# Patient Record
Sex: Female | Born: 1965
Health system: Southern US, Community
[De-identification: ages and names within clinical notes are randomized; demographics above are authoritative.]

## PROBLEM LIST (undated history)

## (undated) ENCOUNTER — Emergency Department: Discharge status: Absent without leave | Payer: Self-pay

## (undated) DIAGNOSIS — M329 Systemic lupus erythematosus, unspecified: Secondary | ICD-10-CM

## (undated) DIAGNOSIS — D573 Sickle-cell trait: Secondary | ICD-10-CM

## (undated) HISTORY — DX: Systemic lupus erythematosus, unspecified: M32.9

## (undated) HISTORY — DX: Sickle-cell trait: D57.3

---

## 2000-07-06 HISTORY — PX: QUADRICEPS TENDON REPAIR: SHX756

## 2008-05-06 ENCOUNTER — Encounter: Admission: RE | Admit: 2008-05-06 | Discharge: 2008-07-26 | Payer: Self-pay | Admitting: Family Medicine

## 2008-06-21 ENCOUNTER — Encounter: Admission: RE | Admit: 2008-06-21 | Discharge: 2008-06-21 | Payer: Self-pay | Admitting: Family Medicine

## 2008-08-09 ENCOUNTER — Encounter: Admission: RE | Admit: 2008-08-09 | Discharge: 2008-08-09 | Payer: Self-pay | Admitting: Family Medicine

## 2008-08-26 DIAGNOSIS — J45909 Unspecified asthma, uncomplicated: Secondary | ICD-10-CM

## 2008-08-26 HISTORY — DX: Unspecified asthma, uncomplicated: J45.909

## 2009-09-07 ENCOUNTER — Encounter: Admission: RE | Admit: 2009-09-07 | Discharge: 2009-09-07 | Payer: Self-pay | Admitting: Family Medicine

## 2010-02-20 ENCOUNTER — Encounter: Payer: Self-pay | Admitting: Family Medicine

## 2011-11-12 ENCOUNTER — Other Ambulatory Visit: Payer: Self-pay | Admitting: Family Medicine

## 2011-11-12 DIAGNOSIS — Z1231 Encounter for screening mammogram for malignant neoplasm of breast: Secondary | ICD-10-CM

## 2011-11-13 ENCOUNTER — Ambulatory Visit (INDEPENDENT_AMBULATORY_CARE_PROVIDER_SITE_OTHER): Payer: BC Managed Care – PPO

## 2011-11-13 DIAGNOSIS — Z1231 Encounter for screening mammogram for malignant neoplasm of breast: Secondary | ICD-10-CM

## 2011-11-13 DIAGNOSIS — R928 Other abnormal and inconclusive findings on diagnostic imaging of breast: Secondary | ICD-10-CM

## 2011-11-16 ENCOUNTER — Other Ambulatory Visit: Payer: Self-pay | Admitting: Family Medicine

## 2011-11-16 DIAGNOSIS — R928 Other abnormal and inconclusive findings on diagnostic imaging of breast: Secondary | ICD-10-CM

## 2011-11-20 ENCOUNTER — Ambulatory Visit
Admission: RE | Admit: 2011-11-20 | Discharge: 2011-11-20 | Disposition: A | Discharge status: Home / Self Care | Payer: BC Managed Care – PPO | Source: Ambulatory Visit | Attending: Family Medicine | Admitting: Family Medicine

## 2011-11-20 DIAGNOSIS — R928 Other abnormal and inconclusive findings on diagnostic imaging of breast: Secondary | ICD-10-CM

## 2011-12-16 ENCOUNTER — Emergency Department (INDEPENDENT_AMBULATORY_CARE_PROVIDER_SITE_OTHER)
Admission: EM | Admit: 2011-12-16 | Discharge: 2011-12-16 | Disposition: A | Discharge status: Home / Self Care | Payer: BC Managed Care – PPO | Source: Home / Self Care | Attending: Emergency Medicine | Admitting: Emergency Medicine

## 2011-12-16 DIAGNOSIS — N921 Excessive and frequent menstruation with irregular cycle: Secondary | ICD-10-CM

## 2011-12-16 DIAGNOSIS — R110 Nausea: Secondary | ICD-10-CM

## 2011-12-16 LAB — POCT CBC W AUTO DIFF (K'VILLE URGENT CARE)

## 2011-12-16 MED ORDER — ONDANSETRON 4 MG PO TBDP: 4.0000 mg | ORAL_TABLET | Freq: Three times a day (TID) | ORAL | Status: DC | PRN

## 2011-12-16 NOTE — ED Notes (Signed)
States she is premenopausal and is having dizziness, vomiting and heavy menstrual flow.

## 2011-12-16 NOTE — ED Provider Notes (Signed)
History     CSN: [id number]  Arrival date & time 12/16/11  1306   First MD Initiated Contact with Patient 12/16/11 1317      Chief Complaint  Patient presents with  . Dizziness  . Nausea    (Consider location/radiation/quality/duration/timing/severity/associated sxs/prior treatment) HPI 46 year old female here with husband. 2 days ago, they were driving towards [place], but on the way, she developed lightheadedness, nausea vomited 2 or 3 times without any blood or black-colored vomitus. Had some mild diffuse abdominal pain and lower abdominal cramps. She felt this was all from the onset of a very heavy menstrual period, which persisted with cramping for a day. She feels the cramps and the menstrual flow has significantly decreased today. No diarrhea. Today, she's been able to tolerate by mouth liquids and small amount of bland solid food today without any vomiting. Still has some mild nausea but that's improving in its mild. Currently, she admits to very minimal lower abdominal cramping but that's minimal and improving. Denies any other abdominal symptoms. She states she is premenopausal and being followed by her PCP. Denies history of anemia and she states past medical history is negative for chronic disease.  She denies any history of tick bite or rash. History reviewed. No pertinent past medical history.  History reviewed. No pertinent past surgical history.  Family History  Problem Relation Age of Onset  . Hypertension Father   . Diabetes Other     History  Substance Use Topics  . Smoking status: Not on file  . Smokeless tobacco: Never Used  . Alcohol Use: No    OB History    Grav Para Term Preterm Abortions TAB SAB Ect Mult Living                  Review of Systems  Constitutional: Positive for fatigue (mild). Negative for fever, chills and diaphoresis.  HENT: Negative.   Eyes: Negative.   Respiratory: Negative.   Cardiovascular: Negative.  Negative for chest  pain and palpitations.  Gastrointestinal: Negative for blood in stool, abdominal distention and anal bleeding.  Genitourinary: Negative.  Negative for dysuria and hematuria.  Musculoskeletal: Negative for back pain and arthralgias.  Neurological: Positive for light-headedness (Was severe, but now is mild.). Negative for tremors, seizures, syncope, speech difficulty, numbness and headaches.  Hematological: Negative.   Psychiatric/Behavioral: Negative for hallucinations and confusion.    Allergies  Penicillins  Home Medications   Current Outpatient Rx  Name  Route  Sig  Dispense  Refill  . VITAMIN D (ERGOCALCIFEROL) 50000 UNITS PO CAPS   Oral   Take 50,000 Units by mouth.         . ONDANSETRON 4 MG PO TBDP   Oral   Take 1 tablet (4 mg total) by mouth every 8 (eight) hours as needed for nausea.   20 tablet   0     BP 91/66  Pulse 81  Temp 97.9 F (36.6 C) (Oral)  Resp 18  Ht 5\' 3"  (1.6 m)  Wt 178 lb (80.74 kg)  BMI 31.53 kg/m2  SpO2 96%  LMP 11/08/2011  Physical Exam  Nursing note and vitals reviewed. Constitutional: She is oriented to person, place, and time. She appears well-developed and well-nourished. No distress.       She appears to be mildly fatigued, but she is alert, cooperative, and is able to walk on her own. Gait normal.  HENT:  Head: Normocephalic and atraumatic.  Mouth/Throat: Oropharynx is clear and moist.  Eyes: Conjunctivae normal and EOM are normal. Pupils are equal, round, and reactive to light. No scleral icterus.  Neck: Normal range of motion. Neck supple. No JVD present.  Cardiovascular: Normal rate and normal heart sounds.  Exam reveals no gallop and no friction rub.   No murmur heard. Pulmonary/Chest: Effort normal and breath sounds normal.  Abdominal: Soft. Bowel sounds are normal. She exhibits no distension and no mass. There is tenderness (Minimal, nonreproducible lower abdominal tenderness). There is no rebound and no guarding.    Musculoskeletal: Normal range of motion. She exhibits no edema and no tenderness.  Lymphadenopathy:    She has no cervical adenopathy.  Neurological: She is alert and oriented to person, place, and time. She displays normal reflexes. No cranial nerve deficit. She exhibits normal muscle tone. Coordination normal.  Skin: Skin is warm and dry. No rash noted. No erythema.  Psychiatric: She has a normal mood and affect.   She declined pelvic exam, and that's deferred to her PCP or her GYN.   ED Course  Procedures (including critical care time)   Labs Reviewed  POCT CBC W AUTO DIFF ([facility])   No results found.   1. Nausea   2. Menometrorrhagia       MDM  Likely has a viral syndrome that is resolving. The menometrorrhagia could be a contributor to her symptoms, but her physical exam is normal and her symptoms are resolving on their own. CBC today within normal limits. WBC 5.5. Hemoglobin 11.9. As she is improving on her own, no other particular treatment is needed other than pushing clear liquids and advancing bland diet as tolerated. I prescribed Zofran ODT when necessary nausea. Note written for her employer. Followup with PCP and/or GYN if any symptoms persist or recur. She and husband voiced understanding and agreement with above.        Lajean Manes, MD 12/16/11 1929

## 2012-04-05 ENCOUNTER — Encounter: Payer: Self-pay | Admitting: *Deleted

## 2012-04-05 ENCOUNTER — Emergency Department (INDEPENDENT_AMBULATORY_CARE_PROVIDER_SITE_OTHER)
Admission: EM | Admit: 2012-04-05 | Discharge: 2012-04-05 | Disposition: A | Discharge status: Home / Self Care | Payer: BC Managed Care – PPO | Source: Home / Self Care | Attending: Family Medicine | Admitting: Family Medicine

## 2012-04-05 DIAGNOSIS — J069 Acute upper respiratory infection, unspecified: Secondary | ICD-10-CM

## 2012-04-05 MED ORDER — AZITHROMYCIN 250 MG PO TABS: ORAL_TABLET | ORAL | Status: DC

## 2012-04-05 NOTE — ED Provider Notes (Signed)
History     CSN: [id number]  Arrival date & time 04/05/12  1627   First MD Initiated Contact with Patient 04/05/12 1631      Chief Complaint  Patient presents with  . Headache  . Hoarse  . Cough   HPI  URI Symptoms Onset: 3 days Description: rhinorrhea, nasal congestion, cough, sinus pressure  Modifying factors:  On plaquenil for lupus   Symptoms Nasal discharge: yes Fever: yes Sore throat: mild Cough: yes Wheezing: no Ear pain: no GI symptoms: no Sick contacts: yes  Red Flags  Stiff neck: no Dyspnea: minimal-mild Rash: no Swallowing difficulty: no  Sinusitis Risk Factors Headache/face pain: mild Double sickening: no tooth pain: no  Allergy Risk Factors Sneezing: no Itchy scratchy throat: no Seasonal symptoms: no  Flu Risk Factors Headache: no muscle aches: no severe fatigue: no   History reviewed. No pertinent past medical history.  History reviewed. No pertinent past surgical history.  Family History  Problem Relation Age of Onset  . Hypertension Father   . Diabetes Other     History  Substance Use Topics  . Smoking status: Not on file  . Smokeless tobacco: Never Used  . Alcohol Use: No    OB History   Grav Para Term Preterm Abortions TAB SAB Ect Mult Living                  Review of Systems  All other systems reviewed and are negative.    Allergies  Penicillins  Home Medications   Current Outpatient Rx  Name  Route  Sig  Dispense  Refill  . ondansetron (ZOFRAN-ODT) 4 MG disintegrating tablet   Oral   Take 1 tablet (4 mg total) by mouth every 8 (eight) hours as needed for nausea.   20 tablet   0   . Vitamin D, Ergocalciferol, (DRISDOL) 50000 UNITS CAPS   Oral   Take 50,000 Units by mouth.           BP 121/73  Pulse 100  Temp(Src) 98.2 F (36.8 C) (Oral)  Resp 16  Ht 5' 3.5" (1.613 m)  Wt 181 lb 8 oz (82.328 kg)  BMI 31.64 kg/m2  SpO2 98%  Physical Exam  Constitutional: She appears well-developed and  well-nourished.  HENT:  Head: Normocephalic and atraumatic.  Right Ear: External ear normal.  Left Ear: External ear normal.  +nasal erythema, rhinorrhea bilaterally, + post oropharyngeal erythema    Eyes: Conjunctivae are normal. Pupils are equal, round, and reactive to light.  Neck: Normal range of motion.  Cardiovascular: Normal rate, regular rhythm and normal heart sounds.   Pulmonary/Chest: Effort normal and breath sounds normal.  Abdominal: Soft.  Musculoskeletal: Normal range of motion.  Lymphadenopathy:    She has no cervical adenopathy.  Neurological: She is alert.  Skin: Skin is warm.    ED Course  Procedures (including critical care time)  Labs Reviewed - No data to display No results found.   1. URI (upper respiratory infection)       MDM  Likely viral process  Will place on zpak for infectious coverage given baseline immunocompromised status.  Discussed infectious and ENT/resp red flags.  Follow up as needed.      The patient and/or caregiver has been counseled thoroughly with regard to treatment plan and/or medications prescribed including dosage, schedule, interactions, rationale for use, and possible side effects and they verbalize understanding. Diagnoses and expected course of recovery discussed and will return if not improved as  expected or if the condition worsens. Patient and/or caregiver verbalized understanding.             Doree Albee, MD 04/05/12 [id number]

## 2012-04-05 NOTE — ED Notes (Signed)
Pt c/o sinus problems, hoarseness, and cough x 3 days. Has tried OTC Airborne, Tylenol Cold and Sinus with little help.

## 2014-04-05 ENCOUNTER — Other Ambulatory Visit: Payer: Self-pay | Admitting: Family Medicine

## 2014-04-05 DIAGNOSIS — Z1231 Encounter for screening mammogram for malignant neoplasm of breast: Secondary | ICD-10-CM

## 2014-04-08 ENCOUNTER — Ambulatory Visit: Payer: BC Managed Care – PPO

## 2014-04-22 ENCOUNTER — Ambulatory Visit (INDEPENDENT_AMBULATORY_CARE_PROVIDER_SITE_OTHER)

## 2014-04-22 DIAGNOSIS — Z1231 Encounter for screening mammogram for malignant neoplasm of breast: Secondary | ICD-10-CM | POA: Diagnosis not present

## 2014-07-05 DIAGNOSIS — S76119S Strain of unspecified quadriceps muscle, fascia and tendon, sequela: Secondary | ICD-10-CM | POA: Insufficient documentation

## 2014-07-05 HISTORY — DX: Strain of unspecified quadriceps muscle, fascia and tendon, sequela: S76.119S

## 2014-07-26 ENCOUNTER — Emergency Department
Admission: EM | Admit: 2014-07-26 | Discharge: 2014-07-26 | Disposition: A | Discharge status: Home / Self Care | Source: Home / Self Care | Attending: Emergency Medicine | Admitting: Emergency Medicine

## 2014-07-26 ENCOUNTER — Encounter: Payer: Self-pay | Admitting: Emergency Medicine

## 2014-07-26 ENCOUNTER — Emergency Department (INDEPENDENT_AMBULATORY_CARE_PROVIDER_SITE_OTHER)

## 2014-07-26 DIAGNOSIS — J9811 Atelectasis: Secondary | ICD-10-CM | POA: Diagnosis not present

## 2014-07-26 DIAGNOSIS — R05 Cough: Secondary | ICD-10-CM | POA: Diagnosis not present

## 2014-07-26 DIAGNOSIS — R059 Cough, unspecified: Secondary | ICD-10-CM

## 2014-07-26 DIAGNOSIS — J209 Acute bronchitis, unspecified: Secondary | ICD-10-CM

## 2014-07-26 MED ORDER — HYDROCODONE-HOMATROPINE 5-1.5 MG/5ML PO SYRP: 5.0000 mL | ORAL_SOLUTION | Freq: Four times a day (QID) | ORAL | Status: DC | PRN

## 2014-07-26 MED ORDER — AZITHROMYCIN 250 MG PO TABS: ORAL_TABLET | ORAL | Status: DC

## 2014-07-26 NOTE — ED Notes (Signed)
Pt c/o productive cough and congestion x1 week. States her lungs feel sore and it hurts to cough. She has also been running low grade fever.

## 2014-07-26 NOTE — ED Provider Notes (Signed)
CSN: [id number]     Arrival date & time 07/26/14  1727 History   First MD Initiated Contact with Patient 07/26/14 1748     Chief Complaint  Patient presents with  . Cough   (Consider location/radiation/quality/duration/timing/severity/associated sxs/prior Treatment) Patient is a 49 y.o. female presenting with cough. The history is provided by the patient. No language interpreter was used.  Cough Cough characteristics:  Productive Sputum characteristics:  Nondescript Severity:  Moderate Onset quality:  Gradual Timing:  Constant Progression:  Worsening Chronicity:  New Smoker: no   Context: upper respiratory infection   Relieved by:  Nothing Worsened by:  Nothing tried Ineffective treatments:  None tried Associated symptoms: sinus congestion   Associated symptoms: no shortness of breath   Risk factors: no recent infection   Pt reports she has had a cough and congestion for the past week.  Pt reports fever on and off.  Pt had knee surgery 3 weeks ago.  Pt reports no new swelling to leg.  Pt reports she feels like she has infection.  History reviewed. No pertinent past medical history. History reviewed. No pertinent past surgical history. Family History  Problem Relation Age of Onset  . Hypertension Father   . Diabetes Other    History  Substance Use Topics  . Smoking status: Not on file  . Smokeless tobacco: Never Used  . Alcohol Use: No   OB History    No data available     Review of Systems  Respiratory: Positive for cough. Negative for shortness of breath.   Cardiovascular: Negative for leg swelling.  All other systems reviewed and are negative.   Allergies  Penicillins  Home Medications   Prior to Admission medications   Medication Sig Start Date End Date Taking? Authorizing Provider  azithromycin (ZITHROMAX) 250 MG tablet Take 2 tabs PO x 1 dose, then 1 tab PO QD x 4 days 04/05/12   Deneise Lever, MD  ondansetron (ZOFRAN-ODT) 4 MG disintegrating tablet Take  1 tablet (4 mg total) by mouth every 8 (eight) hours as needed for nausea. 12/16/11   Jacqulyn Cane, MD  Vitamin D, Ergocalciferol, (DRISDOL) 50000 UNITS CAPS Take 50,000 Units by mouth.    Historical Provider, MD   BP 105/71 mmHg  Pulse 94  Temp(Src) 100 F (37.8 C) (Oral)  SpO2 96% Physical Exam  Constitutional: She is oriented to person, place, and time. She appears well-developed and well-nourished.  HENT:  Head: Normocephalic.  Eyes: Conjunctivae and EOM are normal. Pupils are equal, round, and reactive to light.  Neck: Normal range of motion.  Cardiovascular: Normal rate and normal heart sounds.   Pulmonary/Chest: Effort normal and breath sounds normal.  Abdominal: Soft. She exhibits no distension.  Musculoskeletal: Normal range of motion.  Neurological: She is alert and oriented to person, place, and time.  Psychiatric: She has a normal mood and affect.  Nursing note and vitals reviewed.   ED Course  Procedures (including critical care time) Labs Review Labs Reviewed - No data to display  Imaging Review Dg Chest 2 View  07/26/2014   CLINICAL DATA:  Cough and wheezing. Right side chest pain. Symptoms for 3 days.  EXAM: CHEST  2 VIEW  COMPARISON:  None.  FINDINGS: Lung volumes are low with subsegmental atelectasis in the bases. No consolidative process, pneumothorax or effusion is identified. Heart size is normal.  IMPRESSION: Bibasilar atelectasis in a low volume chest.  No acute abnormality.   Electronically Signed   By: Marcello Moores  Dalessio M.D.   On: 07/26/2014 18:28     MDM pulse ox normal, no tachycardia, chest xray shows atelectasis.  I wil treat pt with zithromax and hydromet for cough.   Pt is given incentive spirometer and instructed in use.  I discussed PE risk post surgery.  I  Explained to pt she would need a Ct angio to rule out PE.  She feels like her symptoms are illness and she is not interested in going to ED and having Ct.  Pt is advised if shortness of breath or  worsening symptoms she will need to go to ED.    1. Acute bronchitis, unspecified organism   2. Cough    zithromax hydromet  avs  [name], [place] 07/26/14 1919

## 2014-07-26 NOTE — Discharge Instructions (Signed)

## 2015-01-05 ENCOUNTER — Ambulatory Visit: Admitting: Internal Medicine

## 2015-03-08 ENCOUNTER — Ambulatory Visit: Admitting: Osteopathic Medicine

## 2015-03-21 ENCOUNTER — Encounter: Payer: Self-pay | Admitting: Osteopathic Medicine

## 2015-03-21 ENCOUNTER — Ambulatory Visit (INDEPENDENT_AMBULATORY_CARE_PROVIDER_SITE_OTHER): Admitting: Osteopathic Medicine

## 2015-03-21 VITALS — BP 119/68 | HR 72 | Wt 194.0 lb

## 2015-03-21 DIAGNOSIS — Z8639 Personal history of other endocrine, nutritional and metabolic disease: Secondary | ICD-10-CM

## 2015-03-21 DIAGNOSIS — R0789 Other chest pain: Secondary | ICD-10-CM

## 2015-03-21 DIAGNOSIS — E538 Deficiency of other specified B group vitamins: Secondary | ICD-10-CM | POA: Diagnosis not present

## 2015-03-21 DIAGNOSIS — D573 Sickle-cell trait: Secondary | ICD-10-CM

## 2015-03-21 DIAGNOSIS — M329 Systemic lupus erythematosus, unspecified: Secondary | ICD-10-CM

## 2015-03-21 DIAGNOSIS — Z13220 Encounter for screening for lipoid disorders: Secondary | ICD-10-CM

## 2015-03-21 DIAGNOSIS — R5382 Chronic fatigue, unspecified: Secondary | ICD-10-CM

## 2015-03-21 DIAGNOSIS — Z79899 Other long term (current) drug therapy: Secondary | ICD-10-CM

## 2015-03-21 HISTORY — DX: Systemic lupus erythematosus, unspecified: M32.9

## 2015-03-21 HISTORY — DX: Sickle-cell trait: D57.3

## 2015-03-21 HISTORY — DX: Personal history of other endocrine, nutritional and metabolic disease: Z86.39

## 2015-03-21 HISTORY — DX: Chronic fatigue, unspecified: R53.82

## 2015-03-21 HISTORY — DX: Other chest pain: R07.89

## 2015-03-21 LAB — COMPLETE METABOLIC PANEL WITH GFR
ALBUMIN: 4 g/dL (ref 3.6–5.1)
ALT: 15 U/L (ref 6–29)
AST: 17 U/L (ref 10–35)
Alkaline Phosphatase: 55 U/L (ref 33–130)
BUN: 11 mg/dL (ref 7–25)
CALCIUM: 9.5 mg/dL (ref 8.6–10.4)
CHLORIDE: 106 mmol/L (ref 98–110)
CO2: 26 mmol/L (ref 20–31)
CREATININE: 0.91 mg/dL (ref 0.50–1.05)
GFR, Est African American: 85 mL/min (ref >=60)
GFR, Est Non African American: 74 mL/min (ref >=60)
GLUCOSE: 93 mg/dL (ref 65–99)
Potassium: 4.3 mmol/L (ref 3.5–5.3)
SODIUM: 141 mmol/L (ref 135–146)
Total Bilirubin: 0.3 mg/dL (ref 0.2–1.2)
Total Protein: 7.7 g/dL (ref 6.1–8.1)

## 2015-03-21 LAB — URINALYSIS
Bilirubin Urine: NEGATIVE
Glucose, UA: NEGATIVE
HGB URINE DIPSTICK: NEGATIVE
Ketones, ur: NEGATIVE
LEUKOCYTES UA: NEGATIVE
NITRITE: NEGATIVE
PH: 6 (ref 5.0–8.0)
PROTEIN: NEGATIVE
Specific Gravity, Urine: 1.013 (ref 1.001–1.035)

## 2015-03-21 LAB — CBC WITH DIFFERENTIAL/PLATELET
BASOS ABS: 0.1 10*3/uL (ref 0.0–0.1)
BASOS PCT: 1 % (ref 0–1)
Eosinophils Absolute: 0.4 10*3/uL (ref 0.0–0.7)
Eosinophils Relative: 7 % — ABNORMAL HIGH (ref 0–5)
HEMATOCRIT: 36.6 % (ref 36.0–46.0)
HEMOGLOBIN: 12.1 g/dL (ref 12.0–15.0)
LYMPHS PCT: 36 % (ref 12–46)
Lymphs Abs: 1.8 10*3/uL (ref 0.7–4.0)
MCH: 27.9 pg (ref 26.0–34.0)
MCHC: 33.1 g/dL (ref 30.0–36.0)
MCV: 84.3 fL (ref 78.0–100.0)
MPV: 9 fL (ref 8.6–12.4)
Monocytes Absolute: 0.5 10*3/uL (ref 0.1–1.0)
Monocytes Relative: 10 % (ref 3–12)
NEUTROS ABS: 2.3 10*3/uL (ref 1.7–7.7)
Neutrophils Relative %: 46 % (ref 43–77)
Platelets: 412 10*3/uL — ABNORMAL HIGH (ref 150–400)
RBC: 4.34 MIL/uL (ref 3.87–5.11)
RDW: 14 % (ref 11.5–15.5)
WBC: 5.1 10*3/uL (ref 4.0–10.5)

## 2015-03-21 LAB — LIPID PANEL
CHOLESTEROL: 197 mg/dL (ref 125–200)
HDL: 42 mg/dL — AB (ref >=46)
LDL Cholesterol: 130 mg/dL — ABNORMAL HIGH (ref <130)
Total CHOL/HDL Ratio: 4.7 Ratio (ref <=5.0)
Triglycerides: 127 mg/dL (ref <150)
VLDL: 25 mg/dL (ref <30)

## 2015-03-21 LAB — VITAMIN B12: Vitamin B-12: >2000 pg/mL — ABNORMAL HIGH (ref 200–1100)

## 2015-03-21 LAB — TSH: TSH: 1.51 m[IU]/L

## 2015-03-21 MED ORDER — CYANOCOBALAMIN 1000 MCG/ML IJ SOLN
1000.0000 ug | Freq: Once | INTRAMUSCULAR | Status: AC
  Administered 2015-03-21: 1000 ug via INTRAMUSCULAR

## 2015-03-21 NOTE — Patient Instructions (Addendum)
Will get routine labs today for basic medical screening and to further evaluate possible issues with fatigue. Once I get the results from [place] and Dr. Dossie Der I reviewed her previous labs as well and at that point we'll determine how often you need to see me for regular follow-up. You're encouraged to schedule an annual wellness visit at your convenience to make sure that you're caught up with other screening labs and procedures for health maintenance and preventive medicine. If you don't hear back from me regarding these records in the next 1-2 weeks, please call the office and make sure that we have received them.  If chest discomfort worsens or is not helped by Tylenol or ibuprofen over-the-counter, come back and we can schedule a visit for possible osteopathic manipulation treatment to address ribs and spine which may be contributing to your pain. If chest pain becomes worse/severe or if you're having chest pain with activity, need to go to the emergency room right away.   Thanks for coming in today! If there is ever anything I can do for you, please don't hesitate to call the office to either leave me a message or schedule a visit to discuss face-to-face in detail. Take care! -Dr. Loni Muse.

## 2015-03-21 NOTE — Progress Notes (Signed)
HPI: Pam Gomez is a 50 y.o. female who presents to [facility] today for chief complaint of:  Chief Complaint  Patient presents with  . Establish Care    "COLLABORATIVE CARE FOR CURRENT MEDICAL CONDITION"     Medical history reviewed: SICKLE CELL TRAIT: No serious issues.  LUPUS: Follows with Dr. Dossie Der with [facility]. Sees her about every 6 months, last appt was about a month ago. Fatigue is bothering her. Was previously on Plaquenil but doesn't like to be on medications so stopped this - occasional rash and joint pain but no serious cardiac/neuro/renal problems  ORTHO: 06/2014 injured Quads - ruptured tendon IBS: no problems ASTHMA: no problems B12: 10/02/12 442 previously getting B12 shots  CHEST PAIN . Location: L chest/ribs . Quality: "pulling feeling" on the side  . Severity: mild/moderate . Duration: was going on before bronchitis  . Timing: interminttent . Assoc signs/symptoms: no SOB or dizziness, no CP on exertion  Previously at [facility] - bloodwork done about 11/2014   Past medical, social and family history reviewed: No past medical history on file. No past surgical history on file. Social History  Substance Use Topics  . Smoking status: Not on file  . Smokeless tobacco: Never Used  . Alcohol Use: No   Family History  Problem Relation Age of Onset  . Hypertension Father   . Diabetes Other     No current outpatient prescriptions on file.   No current facility-administered medications for this visit.   Allergies  Allergen Reactions  . Iodinated Diagnostic Agents Nausea And Vomiting  . Penicillins Hives      Review of Systems: CONSTITUTIONAL:  No  fever, no chills, (+) unintentional weight changes = gain HEAD/EYES/EARS/NOSE/THROAT: No  headache, no vision change, no hearing change, No  sore throat, No  sinus pressure, (+) tinnitus CARDIAC: (+) chest discomfort, No  pressure, No palpitations, No   orthopnea RESPIRATORY: No  cough, No  shortness of breath/wheeze GASTROINTESTINAL: No  nausea, No  vomiting, No  abdominal pain, No  blood in stool, No  diarrhea, No  constipation  MUSCULOSKELETAL: (+) myalgia/arthralgia GENITOURINARY: (+) incontinence, No  abnormal genital bleeding/discharge SKIN: No  rash/wounds/concerning lesions HEM/ONC: No  easy bruising/bleeding, No  abnormal lymph node ENDOCRINE: No polyuria/polydipsia/polyphagia, No  heat/cold intolerance  NEUROLOGIC: No  weakness, No  dizziness, No  slurred speech PSYCHIATRIC: No  concerns with depression, No  concerns with anxiety, No sleep problems, PHQ2 (-)  Exam:  BP 119/68 mmHg  Pulse 72  Wt 194 lb (87.998 kg)  LMP 11/08/2011 Constitutional: VS see above. General Appearance: alert, well-developed, well-nourished, NAD Eyes: Normal lids and conjunctive, non-icteric sclera, PERRLA Ears, Nose, Mouth, Throat: MMM, Normal external inspection ears/nares/mouth/lips/gums, TM normal bilaterally. Pharynx no erythema, no exudate.  Neck: No masses, trachea midline. No thyroid enlargement/tenderness/mass appreciated. No lymphadenopathy Respiratory: Normal respiratory effort. no wheeze, no rhonchi, no rales Cardiovascular: S1/S2 normal, no murmur, no rub/gallop auscultated. RRR. No lower extremity edema. Gastrointestinal: Nontender, no masses. No hepatomegaly, no splenomegaly. No hernia appreciated. Bowel sounds normal. Rectal exam deferred.  Musculoskeletal: Gait normal. No clubbing/cyanosis of digits. Rib motion fairly symmetric bilaterally, ?restriction to inhalation on L side, (+)costochondral tenderness on L Neurological: No cranial nerve deficit on limited exam. Motor and sensation intact and symmetric Skin: warm, dry, intact. No rash/ulcer. No concerning nevi or subq nodules on limited exam.   Psychiatric: Normal judgment/insight. Normal mood and affect. Oriented x3.    No results found for  this or any previous visit (from the past  72 hour(s)).  EKG interpretation: Rate: 65 Rhythm: Sinus No ST/T changes concerning for acute ischemia/infarct    ASSESSMENT/PLAN: Basic labs as below, request records from previous PCP and from her rheumatologist.  Systemic lupus ([place]) - Plan: CBC with Differential/Platelet, Urinalysis  Sickle cell trait ([place]) - Plan: CBC with Differential/Platelet  History of non anemic vitamin B12 deficiency - Plan: B12  Chest discomfort  Chronic fatigue - Plan: CBC with Differential/Platelet, TSH, VITAMIN D 25 Hydroxy (Vit-D Deficiency, Fractures)  Lipid screening - Plan: Lipid panel  Medication management - Plan: CBC with Differential/Platelet, COMPLETE METABOLIC PANEL WITH GFR, [id number]  B12 deficiency - Plan: cyanocobalamin ((VITAMIN B-12)) injection 1,000 mcg   Return At your convenience for annual wellness visit, as needed for other concerns, pending record review.

## 2015-03-22 LAB — VITAMIN D 25 HYDROXY (VIT D DEFICIENCY, FRACTURES): VIT D 25 HYDROXY: 30 ng/mL (ref 30–100)

## 2015-03-23 NOTE — Addendum Note (Signed)
Addended by: Huel Cote on: 03/23/2015 10:09 AM   Modules accepted: Orders

## 2015-03-25 LAB — HM COLONOSCOPY

## 2015-03-29 ENCOUNTER — Encounter: Payer: Self-pay | Admitting: Osteopathic Medicine

## 2015-03-29 DIAGNOSIS — Z9889 Other specified postprocedural states: Secondary | ICD-10-CM

## 2015-03-29 HISTORY — DX: Other specified postprocedural states: Z98.890

## 2015-07-12 ENCOUNTER — Ambulatory Visit (INDEPENDENT_AMBULATORY_CARE_PROVIDER_SITE_OTHER): Admitting: Osteopathic Medicine

## 2015-07-12 ENCOUNTER — Encounter: Payer: Self-pay | Admitting: Osteopathic Medicine

## 2015-07-12 ENCOUNTER — Other Ambulatory Visit (HOSPITAL_COMMUNITY)
Admission: RE | Admit: 2015-07-12 | Discharge: 2015-07-12 | Disposition: A | Discharge status: Home / Self Care | Source: Ambulatory Visit | Attending: Osteopathic Medicine | Admitting: Osteopathic Medicine

## 2015-07-12 ENCOUNTER — Telehealth: Payer: Self-pay | Admitting: Osteopathic Medicine

## 2015-07-12 VITALS — BP 115/79 | HR 78 | Wt 189.0 lb

## 2015-07-12 DIAGNOSIS — R7989 Other specified abnormal findings of blood chemistry: Secondary | ICD-10-CM

## 2015-07-12 DIAGNOSIS — Z01419 Encounter for gynecological examination (general) (routine) without abnormal findings: Secondary | ICD-10-CM | POA: Diagnosis present

## 2015-07-12 DIAGNOSIS — D473 Essential (hemorrhagic) thrombocythemia: Secondary | ICD-10-CM

## 2015-07-12 DIAGNOSIS — M25561 Pain in right knee: Secondary | ICD-10-CM | POA: Diagnosis not present

## 2015-07-12 DIAGNOSIS — Z1239 Encounter for other screening for malignant neoplasm of breast: Secondary | ICD-10-CM

## 2015-07-12 DIAGNOSIS — M25562 Pain in left knee: Secondary | ICD-10-CM

## 2015-07-12 DIAGNOSIS — G8929 Other chronic pain: Secondary | ICD-10-CM | POA: Insufficient documentation

## 2015-07-12 DIAGNOSIS — Z0000 Encounter for general adult medical examination without abnormal findings: Secondary | ICD-10-CM | POA: Diagnosis not present

## 2015-07-12 DIAGNOSIS — Z124 Encounter for screening for malignant neoplasm of cervix: Secondary | ICD-10-CM | POA: Diagnosis not present

## 2015-07-12 DIAGNOSIS — Z1151 Encounter for screening for human papillomavirus (HPV): Secondary | ICD-10-CM | POA: Insufficient documentation

## 2015-07-12 HISTORY — DX: Other specified abnormal findings of blood chemistry: R79.89

## 2015-07-12 LAB — CBC WITH DIFFERENTIAL/PLATELET
Basophils Absolute: 59 cells/uL (ref 0–200)
Basophils Relative: 1 %
EOS PCT: 8 %
Eosinophils Absolute: 472 cells/uL (ref 15–500)
HEMATOCRIT: 36.2 % (ref 35.0–45.0)
Hemoglobin: 12.1 g/dL (ref 11.7–15.5)
LYMPHS PCT: 42 %
Lymphs Abs: 2478 cells/uL (ref 850–3900)
MCH: 27.6 pg (ref 27.0–33.0)
MCHC: 33.4 g/dL (ref 32.0–36.0)
MCV: 82.6 fL (ref 80.0–100.0)
MONOS PCT: 8 %
MPV: 9.2 fL (ref 7.5–12.5)
Monocytes Absolute: 472 cells/uL (ref 200–950)
NEUTROS PCT: 41 %
Neutro Abs: 2419 cells/uL (ref 1500–7800)
PLATELETS: 399 10*3/uL (ref 140–400)
RBC: 4.38 MIL/uL (ref 3.80–5.10)
RDW: 14.1 % (ref 11.0–15.0)
WBC: 5.9 10*3/uL (ref 3.8–10.8)

## 2015-07-12 NOTE — Patient Instructions (Signed)
We are lacking the following records:  - COLONOSCOPY RESULTS - LAST PAP SMEAR RESULTS  You should hear back about you Pap and other labs in the next few days. Please let us know if you haven't heard back in a week!   Anything else we can do for you, just let us know!

## 2015-07-12 NOTE — Telephone Encounter (Signed)
OK 

## 2015-07-12 NOTE — Telephone Encounter (Signed)
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 She had two procedures done already and another procedure on June 19 th will have the notes faxed over to our office........Marland Kitchen

## 2015-07-12 NOTE — Progress Notes (Signed)
HPI: Pam Gomez is a 50 y.o. female who presents to [facility] today for chief complaint of:  Chief Complaint  Patient presents with  . Annual Exam  . Gynecologic Exam  . Knee Pain    Annual exam - see below for preventive care review  KNEE PAIN  . Context: No previous injury . Location: Left knee . Quality: soreness/ occasional sharp pain . Duration: several months getting worse    Past medical, social and family history reviewed: No past medical history on file. Past Surgical History  Procedure Laterality Date  . Cesarean section  2002  . Quadriceps tendon repair  07/06/2000   Social History  Substance Use Topics  . Smoking status: Never Smoker   . Smokeless tobacco: Never Used  . Alcohol Use: No   Family History  Problem Relation Age of Onset  . Hypertension Father   . Diabetes Other   . Depression Paternal Uncle   . Hypertension Paternal Uncle   . Diabetes Paternal Grandmother   . Hypertension Paternal Grandmother   . Stroke Paternal Grandmother   . Alcohol abuse Paternal Grandfather   . Hypertension Paternal Grandfather   . Heart attack Paternal Aunt     Current Outpatient Prescriptions  Medication Sig Dispense Refill  . hydroxychloroquine (PLAQUENIL) 200 MG tablet     . Vitamin D, Ergocalciferol, (DRISDOL) 50000 units CAPS capsule Take by mouth.     No current facility-administered medications for this visit.   Allergies  Allergen Reactions  . Iodinated Diagnostic Agents Nausea And Vomiting  . Penicillins Hives      Review of Systems: CONSTITUTIONAL:  No  fever, no chills, No recent illness, No unintentional weight changes HEAD/EYES/EARS/NOSE/THROAT: No  headache, no vision change, CARDIAC: No  chest pain, No  pressure RESPIRATORY: No  cough, No  shortness of breath GASTROINTESTINAL: No  nausea, No  vomiting, No  abdominal pain, MUSCULOSKELETAL: (+) myalgia/arthralgia SKIN: No  rash/wounds/concerning  lesions NEUROLOGIC: No  weakness, No  dizziness, No  slurred speech PSYCHIATRIC: No  concerns with depression, No  concerns with anxiety, No sleep problems  Exam:  BP 115/79 mmHg  Pulse 78  Wt 189 lb (85.73 kg)  LMP 11/08/2011 Constitutional: VS see above. General Appearance: alert, well-developed, well-nourished, NAD Eyes: Normal lids and conjunctive, non-icteric sclera Ears, Nose, Mouth, Throat: MMM, Normal external inspection ears/nares/mouth/lips/gums,  Neck: No masses, trachea midline. No thyroid enlargement. No tenderness/mass appreciated. No lymphadenopathy Respiratory: Normal respiratory effort. no wheeze, no rhonchi, no rales Cardiovascular: S1/S2 normal, no murmur, no rub/gallop auscultated. RRR.  Gastrointestinal: Nontender, no masses. No hepatomegaly, no splenomegaly. No hernia appreciated. Bowel sounds normal. Rectal exam deferred.  Musculoskeletal: Gait normal. No clubbing/cyanosis of digits. (+) crepitus on possive motion of R knee, ant/post drawer negative, neg varus/valgus stress, neg McMurrays and Apley's/  Neurological: No cranial nerve deficit on limited exam. Motor and sensation intact and symmetric Skin: warm, dry, intact. No rash/ulcer. No concerning nevi or subq nodules on limited exam.   Psychiatric: Normal judgment/insight. Normal mood and affect. Oriented x3.  GYN: No lesions/ulcers to external genitalia, normal urethra, normal vaginal mucosa, physiologic discharge, cervix normal without lesions, uterus not enlarged or tender, adnexa no masses and nontender BREAST: No rashes/skin changes, normal fibrous breast tissue, no masses or tenderness, normal nipple without discharge, normal axilla   No results found for this or any previous visit (from the past 72 hour(s)).  No results found.   ASSESSMENT/PLAN:  Encounter for annual physical  exam  Cervical cancer screening - Plan: Cytology - PAP  Breast cancer screening - Plan: MM DIGITAL SCREENING  BILATERAL  Elevated platelet count (HCC) - Plan: CBC with Differential/Platelet  Right knee pain - likely OA< pt requests refer to PT, consider sports med refer for joint injection  - Plan: Ambulatory referral to Physical Therapy   All questions were answered. Visit summary with medication list and pertinent instructions was printed for patient to review. ER/RTC precautions were reviewed with the patient. Return in about 1 year (around 07/11/2016), or sooner if needed, for [employer] [facility] [place].   FEMALE PREVENTIVE CARE  ANNUAL SCREENING/COUNSELING Tobacco - noNever  Alcohol - social drinker Diet/Exercise - HEALTHY HABITS DISCUSSED TO DECREASE CV RISK Depression - PQH2 Negative Domestic violence concerns - no HTN SCREENING - SEE VITALS Vaccination status - SEE BELOW  SEXUAL HEALTH Sexually active in the past year - yes With  - Yes with female. STI - The patient denies history of sexually transmitted disease. STI testing today? - no   INFECTIOUS DISEASE SCREENING HIV - all adults 15-65 - does not need GC/CT - sexually active - does not need HepC - DOB [birth year] - does not need TB - does not need  DISEASE SCREENING Lipid - does not need DM2 - does not need Osteoporosis - does not need  CANCER SCREENING Cervical - needs Breast - MAMMO - needs Lung - does not need Colon - does not need  ADULT VACCINATION Influenza - already has Td - already has HPV - was not indicated Zoster - was not indicated Pneumonia - was not indicated  Name Dates Previously Given Next Due  Hepatitis A 05/15/2007   Hepatitis B 06/11/2008, 12/12/2007, 11/05/2007, 05/15/2007   IPV 11/21/2013   Influenza Adult Tri 11/05/2013, 01/20/2013   MMR 11/05/2013   Tdap 05/15/2007   Varicella 11/27/2013     OTHER Fall - exercise and Vit D age 16+ - does not need Consider ASA - age 34-59 - does not need

## 2015-07-13 LAB — CYTOLOGY - PAP

## 2015-07-19 ENCOUNTER — Telehealth: Payer: Self-pay | Admitting: *Deleted

## 2015-07-19 NOTE — Telephone Encounter (Signed)
Pt called and states she needs a statement that she has to submit to the [employer] stating that she is healthy to complete training. She states she is also  dropping a form off to be signed in regard to the [employer] training and a sample letter of what the statement needs to look like. She states it is an overall health assessment and no conditions that don't  affect her training need to be mentioned. She states she is also going to drop off a sample letter outlining what needs to be stated. She also ask that she would like to keep all of her information limited to just Dr. Sheppard Coil and her assistant and Me since we have established a "mental and professional relationship." Routing the message to provider as was conveyed to the patient.

## 2015-07-21 ENCOUNTER — Encounter: Payer: Self-pay | Admitting: Osteopathic Medicine

## 2015-07-21 ENCOUNTER — Ambulatory Visit (INDEPENDENT_AMBULATORY_CARE_PROVIDER_SITE_OTHER): Admitting: Physical Therapy

## 2015-07-21 ENCOUNTER — Encounter: Payer: Self-pay | Admitting: Physical Therapy

## 2015-07-21 DIAGNOSIS — M25661 Stiffness of right knee, not elsewhere classified: Secondary | ICD-10-CM

## 2015-07-21 DIAGNOSIS — M79651 Pain in right thigh: Secondary | ICD-10-CM | POA: Diagnosis not present

## 2015-07-21 DIAGNOSIS — M6281 Muscle weakness (generalized): Secondary | ICD-10-CM

## 2015-07-21 NOTE — Therapy (Signed)
[facility] [facility] [place], [place], [address] Phone: [phone]   Fax:  [phone]  Physical Therapy Evaluation  Patient Details  Name: Pam Gomez MRN: [id number] Date of Birth: March 25, 1965 Referring Provider: Dr Emeterio Reeve  Encounter Date: 07/21/2015      PT End of Session - 07/21/15 1748    Visit Number 1   Number of Visits 12   Date for PT Re-Evaluation 09/15/15   PT Start Time 1618   PT Stop Time 1725   PT Time Calculation (min) 67 min   Activity Tolerance Patient tolerated treatment well      History reviewed. No pertinent past medical history.  Past Surgical History  Procedure Laterality Date  . Cesarean section  2002  . Quadriceps tendon repair  07/06/2000    There were no vitals filed for this visit.       Subjective Assessment - 07/21/15 1618    Subjective Pt reports June 8th 2016 Rt quad tear, she had surgery, was immobilized for months and then started therapy a year ago, this was finished up in February.  Main focus was to restore ROM and then strengthening.  She tries to perform some of her HEP currently.  the knee feels knotty on either side of the incision and it feels warm    Pertinent History Lupus   How long can you sit comfortably? tolerates 1 hr   How long can you walk comfortably? not limited if she takes it easy.    Diagnostic tests nothing recently.    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    Currently in Pain? Yes   Pain Score 4    Pain Location Knee   Pain Orientation Right;Proximal   Pain Descriptors / Indicators Tightness;Throbbing   Pain Type Acute pain;Surgical pain   Pain Onset More than a month ago   Pain Frequency Intermittent   Aggravating Factors  varies with activity, prolonged periods of time being still, going up /down steps, down is worse   Pain Relieving Factors ice, sometimes  heat.             [facility] PT Assessment - 07/21/15 0001    Assessment   Medical Diagnosis Rt knee pain    Referring Provider Dr Emeterio Reeve   Onset Date/Surgical Date 07/07/14   Hand Dominance Right   Next MD Visit PRN   Prior Therapy ended in Feb 2017   Precautions   Precautions None   Balance Screen   Has the patient fallen in the past 6 months No   Has the patient had a decrease in activity level because of a fear of falling?  No   Is the patient reluctant to leave their home because of a fear of falling?  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, [employer]   Leisure teacher    Observation/Other Assessments   Focus on Therapeutic Outcomes (FOTO)  58% limited   Observation/Other Assessments-Edema    Edema --  (+) in Rt knee to the ankle - visible   Functional Tests   Functional tests Squat;Lunges;Single leg  stance   Squat   Comments shifts to the Lt    Lunges   Comments WNL with HHA   Single Leg Stance   Comments Rt 10 sec, Lt 15 sec   Posture/Postural Control   Posture/Postural Control --  pronation with SLE   ROM / Strength   AROM / PROM / Strength AROM;PROM;Strength   AROM   AROM Assessment Site Knee   Right/Left Knee Left;Right   Right Knee Extension 0   Right Knee Flexion 120   Left Knee Extension 0   Left Knee Flexion 128   PROM   PROM Assessment Site Knee   Right/Left Knee Right   Right Knee Flexion 127   Strength   Strength Assessment Site Hip;Knee;Ankle   Right/Left Hip Right  Lt WNL   Right Hip Flexion 4/5   Right Hip Extension 4/5   Right Hip ABduction 4+/5   Right/Left Knee Right  Lt WNL   Right Knee Flexion 4+/5   Right Knee Extension 4/5   Right/Left Ankle --   bilat WNL   Flexibility   Soft Tissue Assessment /Muscle Length yes   Quadriceps Lt 122, Rt 115   Palpation   Patella mobility Rt decreased superior movement, patella sits deep in the groove.    Palpation comment very tight in the distal  vastus lateralis and VMO Rt                    [facility] Adult PT Treatment/Exercise - 07/21/15 0001    Exercises   Exercises Knee/Hip   Knee/Hip Exercises: Stretches   Quad Stretch Right;1 rep  45 sec prone with strap   Knee/Hip Exercises: Standing   Step Down Right;10 reps;Step Height: 6"  heel taps work on eccentrics   SLS Rt with FWD leans, VC for form   Modalities   Modalities Electrical Stimulation;Moist Heat   Moist Heat Therapy   Number Minutes Moist Heat 15 Minutes   Moist Heat Location --  Rt thigh   Electrical Stimulation   Electrical Stimulation Location Rt knee   Electrical Stimulation Action ion repelling   Electrical Stimulation Parameters to tolerance   Electrical Stimulation Goals Edema                PT Education - 07/21/15 1748    Education provided Yes   Education Details HEP   Person(s) Educated Patient   Methods Explanation;Demonstration;Handout   Comprehension Returned demonstration;Verbal cues required             PT Long Term Goals - 07/21/15 1755    PT LONG TERM GOAL #1   Title I with advanced HEP ( 09/15/15)    Time 8   Period Weeks   Status New   PT LONG TERM GOAL #2   Title be able to alternate stairs with good quad control and no pain ( 09/15/15)    Time 8   Period Weeks   Status New   PT LONG TERM GOAL #3   Title increase Rt hip and knee strength =/> 5-/5 (09/15/15)    Time 8   Period Weeks   Status New   PT LONG TERM GOAL #4   Title demo Rt prone quad flexibility =/> 125 degrees ( 09/15/15)    Time 8   Period Weeks   Status New   PT LONG TERM GOAL #5   Title improve FOTO =/< 37% limited ( 09/15/15)    Time 8   Period Weeks  Status New               Plan -  07/21/15 1749    Clinical Impression Statement 49 yo female presents one year s/p Rt quad tendon repair.  She has therapy for ~ 7 months and was doing OK after this.  Her Rt quad continues to feel tight and restricted and her pain has intensified over the last couple of months.  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  She also has Lupus which adds another compenent to her rehab process.  She will be out of town on [place] for two weeks during her rehab.    Rehab Potential Good   PT Frequency 2x / week   PT Duration 8 weeks   PT Treatment/Interventions Manual techniques;Therapeutic exercise;Moist Heat;Vasopneumatic Device;Taping;Therapeutic activities;Iontophoresis 4mg /ml Dexamethasone;Electrical Stimulation;Dry needling;Stair training;Cryotherapy;Patient/family education;Ultrasound;Neuromuscular re-education   PT Next Visit Plan TDN to Rt quad      Patient will benefit from skilled therapeutic intervention in order to improve the following deficits and impairments:  Increased edema, Decreased strength, Pain, Impaired flexibility, Increased muscle spasms, Decreased range of motion  Visit Diagnosis: Pain in right thigh - Plan: PT plan of care cert/re-cert  Muscle weakness (generalized) - Plan: PT plan of care cert/re-cert  Stiffness of right knee, not elsewhere classified - Plan: PT plan of care cert/re-cert     Problem List Patient Active Problem List   Diagnosis Date Noted  . Right knee pain 07/12/2015  . Elevated platelet count ([place]) 07/12/2015  . H/O colonoscopy 03/29/2015  . Systemic lupus ([place]) 03/21/2015  . Sickle cell trait ([place]) 03/21/2015  . History of non anemic vitamin B12 deficiency 03/21/2015  . Chest discomfort 03/21/2015  . Chronic fatigue 03/21/2015    Jeral Pinch PT  07/21/2015, [place], [place], [address] Phone: [phone]   Fax:  [phone]  Name: Guylene Jentzsch MRN: [id number] Date of Birth: [birth year]

## 2015-07-21 NOTE — Patient Instructions (Signed)
Straight Leg Raise: With External Leg Rotation    Lie on back with right leg straight, opposite leg bent. Rotate straight leg out and lift _10-12___ inches. Repeat _10___ times per set. Do __2-3__ sets per session. Do _1___ sessions per day.  Balance: Unilateral - Forward Lean    Stand on right foot, hands on hips. Keeping hips level, bend forward as if to touch forehead to wall. Hold _1___ seconds. Relax. Repeat __10__ times per set. Do __2-3__ sets per session. Do __1__ sessions per day.  Quads / HF, Prone    Lie face down, knees together. Grasp one ankle with same-side hand. Use towel if needed to reach. Gently pull foot toward buttock. Hold _30-45__ seconds. Repeat _1__ times per session. Do _1_ sessions per day.  Quad Strength, Proprioception: Step Over    Stand forward with involved leg on step. Step other leg down, touching heel to ground with no weight on heel. Return to start. Use _6-8___ inch step. Repeat __8-10__ times for __2-3__ reps. Do __1__ sessions per day.  Copyright  VHI. All rights reserved.   Trigger Point Dry Needling  . What is Trigger Point Dry Needling (DN)? o DN is a physical therapy technique used to treat muscle pain and dysfunction. Specifically, DN helps deactivate muscle trigger points (muscle knots).  o A thin filiform needle is used to penetrate the skin and stimulate the underlying trigger point. The goal is for a local twitch response (LTR) to occur and for the trigger point to relax. No medication of any kind is injected during the procedure.   . What Does Trigger Point Dry Needling Feel Like?  o The procedure feels different for each individual patient. Some patients report that they do not actually feel the needle enter the skin and overall the process is not painful. Very mild bleeding may occur. However, many patients feel a deep cramping in the muscle in which the needle was inserted. This is the local twitch response.   Marland Kitchen How Will I  feel after the treatment? o Soreness is normal, and the onset of soreness may not occur for a few hours. Typically this soreness does not last longer than two days.  o Bruising is uncommon, however; ice can be used to decrease any possible bruising.  o In rare cases feeling tired or nauseous after the treatment is normal. In addition, your symptoms may get worse before they get better, this period will typically not last longer than 24 hours.   . What Can I do After My Treatment? o Increase your hydration by drinking more water for the next 24 hours. o You may place ice or heat on the areas treated that have become sore, however, do not use heat on inflamed or bruised areas. Heat often brings more relief post needling. o You can continue your regular activities, but vigorous activity is not recommended initially after the treatment for 24 hours. o DN is best combined with other physical therapy such as strengthening, stretching, and other therapies.

## 2015-07-22 ENCOUNTER — Ambulatory Visit (INDEPENDENT_AMBULATORY_CARE_PROVIDER_SITE_OTHER)

## 2015-07-22 DIAGNOSIS — Z1231 Encounter for screening mammogram for malignant neoplasm of breast: Secondary | ICD-10-CM | POA: Diagnosis not present

## 2015-07-22 DIAGNOSIS — Z1239 Encounter for other screening for malignant neoplasm of breast: Secondary | ICD-10-CM

## 2015-07-22 NOTE — Telephone Encounter (Signed)
Left message on patient vm advising that she call the office and schedule an appointment to come in and discuss paperwork. Keshana Klemz,CMA

## 2015-07-22 NOTE — Telephone Encounter (Signed)
I haven't received "sample letter" yet. Given the complex nature of this paperwork and legal implications of accidentally providing any inaccurate information on such forms/letter, I'm going to ask the patient make an appointment to discuss this with me in detail so we can get this paperwork done for her correctly and also to determine the nature of the training she is asking me to clear her for...  If patient is concerned about her medical information being shared, I would ask she contact our office manager to discuss our privacy policies.Marland KitchenMarland Kitchen

## 2015-07-27 ENCOUNTER — Ambulatory Visit (INDEPENDENT_AMBULATORY_CARE_PROVIDER_SITE_OTHER): Admitting: Physical Therapy

## 2015-07-27 DIAGNOSIS — M6281 Muscle weakness (generalized): Secondary | ICD-10-CM

## 2015-07-27 DIAGNOSIS — M25661 Stiffness of right knee, not elsewhere classified: Secondary | ICD-10-CM

## 2015-07-27 DIAGNOSIS — M79651 Pain in right thigh: Secondary | ICD-10-CM

## 2015-07-27 NOTE — Therapy (Signed)
[place], [place], [address] Phone: [phone]   Fax:  [phone]  Physical Therapy Treatment  Patient Details  Name: Pam Gomez MRN: [id number] Date of Birth: 1965-09-20 Referring Provider: Dr Emeterio Reeve  Encounter Date: 07/27/2015      PT End of Session - 07/27/15 1606    Visit Number 2   Number of Visits 12   Date for PT Re-Evaluation 09/15/15   PT Start Time 1606   PT Stop Time 1703   PT Time Calculation (min) 57 min   Activity Tolerance Patient tolerated treatment well      No past medical history on file.  Past Surgical History  Procedure Laterality Date  . Cesarean section  2002  . Quadriceps tendon repair  07/06/2000    There were no vitals filed for this visit.      Subjective Assessment - 07/27/15 1606    Subjective Pt states intermittent pain today 4-6/10 pain    Currently in Pain? Yes   Pain Score 6    Pain Location Knee   Pain Orientation Right;Proximal   Pain Descriptors / Indicators Tightness;Throbbing   Pain Type Acute pain;Surgical pain   Pain Onset More than a month ago   Pain Frequency Constant                         [facility] Adult PT Treatment/Exercise - 07/27/15 0001    Knee/Hip Exercises: Stretches   Gastroc Stretch Both;30 seconds  at wall   Knee/Hip Exercises: Aerobic   Recumbent Bike L2x5' VC to keep heels down   Knee/Hip Exercises: Standing   SLS stand to sit Rt LE 3x8   Modalities   Modalities Electrical Stimulation;Moist Heat   Moist Heat Therapy   Number Minutes Moist Heat 15 Minutes   Moist Heat Location --  Rt thigh   Electrical Stimulation   Electrical Stimulation Location Rt quad   Electrical Stimulation Action IFC   Electrical Stimulation Parameters  to tolerance   Electrical Stimulation Goals Tone;Pain   Manual Therapy   Manual Therapy Soft tissue mobilization   Soft tissue mobilization Rt quad distal to  increase flexibiity and decrease scar tissue  pt very tight distal vastus lateralis, VMO loosened up nicel          Trigger Point Dry Needling - 07/27/15 1616    Consent Given? Yes   Education Handout Provided Yes   Muscles Treated Lower Body Quadriceps  Rt    Quadriceps Response Twitch response elicited;Palpable increased muscle length                   PT Long Term Goals - 07/21/15 1755    PT LONG TERM GOAL #1   Title I with advanced HEP ( 09/15/15)    Time 8   Period Weeks   Status New   PT LONG TERM GOAL #2   Title be able to alternate stairs with good quad control and no pain ( 09/15/15)    Time 8   Period Weeks   Status New   PT LONG TERM GOAL #3   Title increase Rt hip and knee strength =/> 5-/5 (09/15/15)    Time 8   Period Weeks   Status New   PT LONG TERM GOAL #4   Title demo Rt prone quad flexibility =/> 125 degrees ( 09/15/15)    Time 8   Period Weeks   Status  New   PT LONG TERM GOAL #5   Title improve FOTO =/< 37% limited ( 09/15/15)    Time [place]   Status New               Plan - 07/27/15 1643    Clinical Impression Statement Pragya had a good response to TDN, better on the medial quad than lateral.  Lateral is still very tight with restrictions. This is her second visit.    PT Frequency 2x / week   PT Duration 8 weeks   PT Treatment/Interventions Manual techniques;Therapeutic exercise;Moist Heat;Vasopneumatic Device;Taping;Therapeutic activities;Iontophoresis 4mg /ml Dexamethasone;Electrical Stimulation;Dry needling;Stair training;Cryotherapy;Patient/family education;Ultrasound;Neuromuscular re-education   PT Next Visit Plan assess response to TDN   Consulted and Agree with Plan of Care Patient      Patient will benefit from skilled therapeutic intervention in order to improve the following deficits and impairments:  Increased edema, Decreased strength, Pain, Impaired flexibility, Increased muscle spasms, Decreased range of  motion  Visit Diagnosis: Pain in right thigh  Muscle weakness (generalized)  Stiffness of right knee, not elsewhere classified     Problem List Patient Active Problem List   Diagnosis Date Noted  . Right knee pain 07/12/2015  . Elevated platelet count ([place]) 07/12/2015  . H/O colonoscopy 03/29/2015  . 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 Sickle cell trait ([place]) 03/21/2015  . History of non anemic vitamin B12 deficiency 03/21/2015  . Chest discomfort 03/21/2015  . Chronic fatigue 03/21/2015    Jeral Pinch PT  07/27/2015, 4:50 PM  [facility] [place], [place], [address] Phone: [phone]   Fax:  [phone]  Name: Pam Gomez MRN: [id number] Date of Birth: 05/30/1965

## 2015-07-29 ENCOUNTER — Encounter: Admitting: Rehabilitative and Restorative Service Providers"

## 2015-08-01 ENCOUNTER — Ambulatory Visit (INDEPENDENT_AMBULATORY_CARE_PROVIDER_SITE_OTHER): Admitting: Rehabilitative and Restorative Service Providers"

## 2015-08-01 ENCOUNTER — Encounter: Payer: Self-pay | Admitting: Rehabilitative and Restorative Service Providers"

## 2015-08-01 DIAGNOSIS — M79651 Pain in right thigh: Secondary | ICD-10-CM

## 2015-08-01 DIAGNOSIS — M25661 Stiffness of right knee, not elsewhere classified: Secondary | ICD-10-CM

## 2015-08-01 DIAGNOSIS — M6281 Muscle weakness (generalized): Secondary | ICD-10-CM | POA: Diagnosis not present

## 2015-08-01 NOTE — Therapy (Signed)
[facility] [place], [place], [address] Phone: [phone]   Fax:  [phone]  Physical Therapy Treatment  Patient Details  Name: Ladelle Enoch MRN: [id number] Date of Birth: 1966-01-08 Referring Provider: Dr Emeterio Reeve  Encounter Date: 08/01/2015      PT End of Session - 08/01/15 1609    Visit Number 3   Number of Visits 12   Date for PT Re-Evaluation 09/15/15   PT Start Time [id number]   PT Stop Time 1650   PT Time Calculation (min) 56 min   Activity Tolerance Patient tolerated treatment well      History reviewed. No pertinent past medical history.  Past Surgical History  Procedure Laterality Date  . Cesarean section  2002  . Quadriceps tendon repair  07/06/2000    There were no vitals filed for this visit.      Subjective Assessment - 08/01/15 1610    Subjective Not much soreness from TDN - felt like the needling helped some - the leg stiffened up but not as much as it usually does.    Currently in Pain? Yes   Pain Score 2    Pain Location Knee   Pain Orientation Right;Proximal   Pain Descriptors / Indicators Tightness;Throbbing   Pain Type Acute pain   Pain Onset More than a month ago   Pain Frequency Constant                         [facility] Adult PT Treatment/Exercise - 08/01/15 0001    Therapeutic Activites    Therapeutic Activities --  instructed in myofacial ball/stick work   Exercises   Exercises Knee/Hip   Knee/Hip Exercises: Public affairs consultant 3 reps;60 seconds  repeated x3 with half roll under distal thigh    Quad Stretch Limitations added quad stretch rolling slightly to the Rt to increase stretch 30 sec x 2    Gastroc Stretch Both;30 seconds  at wall   Knee/Hip Exercises: Aerobic   Recumbent Bike L2 x 10 min    Knee/Hip Exercises: Standing   SLS stand to sit Rt LE 3x8   Modalities   Modalities Electrical Stimulation;Moist Heat   Moist Heat  Therapy   Number Minutes Moist Heat 15 Minutes   Moist Heat Location --  Rt thigh   Electrical Stimulation   Electrical Stimulation Location Rt quad   Electrical Stimulation Action IFC   Electrical Stimulation Parameters to tolerance    Electrical Stimulation Goals Tone;Pain   Manual Therapy   Manual Therapy Soft tissue mobilization   Soft tissue mobilization Rt quad distal to increase flexibiity and decrease scar tissue  tight distal vastus lateralis-improved with treatment   Kinesiotex --  to improve patellar alighment/prolonged stretch lat quad          Trigger Point Dry Needling - 08/01/15 1700    Consent Given? Yes   Muscles Treated Lower Body --  lateral quad Rt    Quadriceps Response Twitch response elicited;Palpable increased muscle length                   PT Long Term Goals - 08/01/15 1609    PT LONG TERM GOAL #1   Title I with advanced HEP ( 09/15/15)    Time 8   Period Weeks   Status On-going   PT LONG TERM GOAL #2   Title be able to alternate stairs with good quad control  and no pain ( 09/15/15)    Time 8   Period Weeks   Status On-going   PT LONG TERM GOAL #3   Title increase Rt hip and knee strength =/> 5-/5 (09/15/15)    Time 8   Period Weeks   Status On-going   PT LONG TERM GOAL #4   Title demo Rt prone quad flexibility =/> 125 degrees ( 09/15/15)    Time 8   Period Weeks   Status On-going   PT LONG TERM GOAL #5   Title improve FOTO =/< 37% limited ( 09/15/15)    Time 8   Period Weeks   Status On-going               Plan - 08/01/15 1701    Clinical Impression Statement Patient tolerated only a few needles for TDN but noted good tissue release through the lateral distal quad with the needles she tolerated. Also noted good release with stretching; manual work and TDN. Added trial of kineso tape to improve alignment of patella and provide prolonged gentle sustained stretch through the lateral quad. Noted improved patellar moblity.  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 She will continue with rehab upon her return.    Rehab Potential Good   PT Frequency 2x / week   PT Duration 6 weeks   PT Treatment/Interventions Manual techniques;Therapeutic exercise;Moist Heat;Vasopneumatic Device;Taping;Therapeutic activities;Iontophoresis 4mg /ml Dexamethasone;Electrical Stimulation;Dry needling;Stair training;Cryotherapy;Patient/family education;Ultrasound;Neuromuscular re-education   PT Next Visit Plan progress with TDN; myofacial release; deep tissue work through the [employer] and Agree with Plan of Care Patient      Patient will benefit from skilled therapeutic intervention in order to improve the following deficits and impairments:  Increased edema, Decreased strength, Pain, Impaired flexibility, Increased muscle spasms, Decreased range of motion  Visit Diagnosis: Pain in right thigh  Muscle weakness (generalized)  Stiffness of right knee, not elsewhere classified     Problem List Patient Active Problem List   Diagnosis Date Noted  . Right knee pain 07/12/2015  . Elevated platelet count ([place]) 07/12/2015  . H/O colonoscopy 03/29/2015  . Systemic lupus ([place]) 03/21/2015  . Sickle cell trait ([place]) 03/21/2015  . History of non anemic vitamin B12 deficiency 03/21/2015  . Chest discomfort 03/21/2015  . Chronic fatigue 03/21/2015    Bastien Strawser Nilda Simmer PT, MPH  08/01/2015, 5:13 PM  [facility] [place], [place], [address] Phone: [phone]   Fax:  [phone]  Name: Ramsay Kollmann MRN: [id number] Date of Birth: 05-26-1965

## 2015-08-22 ENCOUNTER — Encounter: Payer: Self-pay | Admitting: Physical Therapy

## 2015-08-22 ENCOUNTER — Encounter (INDEPENDENT_AMBULATORY_CARE_PROVIDER_SITE_OTHER): Payer: Self-pay

## 2015-08-22 ENCOUNTER — Ambulatory Visit (INDEPENDENT_AMBULATORY_CARE_PROVIDER_SITE_OTHER): Admitting: Physical Therapy

## 2015-08-22 DIAGNOSIS — M6281 Muscle weakness (generalized): Secondary | ICD-10-CM | POA: Diagnosis not present

## 2015-08-22 DIAGNOSIS — M79651 Pain in right thigh: Secondary | ICD-10-CM | POA: Diagnosis not present

## 2015-08-22 DIAGNOSIS — M25661 Stiffness of right knee, not elsewhere classified: Secondary | ICD-10-CM

## 2015-08-22 NOTE — Therapy (Signed)
[place], [place], [address] Phone: [phone]   Fax:  [phone]  Physical Therapy Treatment  Patient Details  Name: Pam Gomez MRN: [id number] Date of Birth: Nov 07, 1965 Referring Provider: Dr Emeterio Reeve  Encounter Date: 08/22/2015      PT End of Session - 08/22/15 1644    Visit Number 4   Number of Visits 12   Date for PT Re-Evaluation 09/15/15   PT Start Time [id number]   PT Stop Time [id number]   PT Time Calculation (min) 40 min   Activity Tolerance Patient tolerated treatment well      History reviewed. No pertinent past medical history.  Past Surgical History:  Procedure Laterality Date  . CESAREAN SECTION  2002  . QUADRICEPS TENDON REPAIR  07/06/2000    There were no vitals filed for this visit.      Subjective Assessment - 08/22/15 1605    Subjective Pt has been away for her two [place] training, she kept her exercises going  however her knee was really sore. WAs really sore over the weekend, used ice and heat  to help settle it down.    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    Currently in Pain? No/denies  only feels tightness today                         [facility] Adult PT Treatment/Exercise - 08/22/15 0001      Exercises   Exercises Knee/Hip     Knee/Hip Exercises: Aerobic   Recumbent Bike L2 x 6'     Knee/Hip Exercises: Standing   Forward Lunges Both;5 sets  using door frame for supportt   SLS stand to sit Rt LE 3x8  slowly lowering the bed     Manual Therapy   Manual Therapy Soft tissue mobilization   Soft tissue mobilization Rt quad and hip adductors   Kinesiotex Facilitate Muscle  to facilitate improved patellar alignment           Trigger Point Dry Needling - 08/22/15 1619    Consent Given? Yes   Muscles Treated Lower Body Quadriceps;Adductor  longus/brevius/maximus   Quadriceps Response Palpable increased muscle length;Twitch response elicited  medial   Adductor Response Palpable increased muscle length;Twitch response elicited  Rt                    PT Long Term Goals - 08/22/15 1654      PT LONG TERM GOAL #1   Title I with advanced HEP ( 09/15/15)    Time 8   Period Weeks   Status On-going     PT LONG TERM GOAL #2   Title be able to alternate stairs with good quad control and no pain ( 09/15/15)    Time 8   Period Weeks   Status On-going     PT LONG TERM GOAL #3   Title increase Rt hip and knee strength =/> 5-/5 (09/15/15)    Time 8   Period Weeks   Status On-going     PT LONG TERM GOAL #4   Title demo Rt prone quad flexibility =/> 125 degrees ( 09/15/15)    Time 8   Period Weeks   Status On-going     PT LONG TERM GOAL #5   Title improve FOTO =/< 37% limited ( 09/15/15)    Time 8  Period Weeks   Status On-going               Plan - 08/22/15 1652    Clinical Impression Statement Pam Gomez has returned for treatment after being on her annual [employer].  She had excellent relief from the tape.  She has less palpable tightness in her Rt quad than on initial eval. Progressing to goals. Continues to have weakness in her legs.    Rehab Potential Good   PT Frequency 2x / week   PT Duration 6 weeks   PT Treatment/Interventions Manual techniques;Therapeutic exercise;Moist Heat;Vasopneumatic Device;Taping;Therapeutic activities;Iontophoresis 4mg /ml Dexamethasone;Electrical Stimulation;Dry needling;Stair training;Cryotherapy;Patient/family education;Ultrasound;Neuromuscular re-education   PT Next Visit Plan progress with TDN; myofacial release; deep tissue work through the quads and Geophysical data processor with Plan of Care Patient      Patient will benefit from skilled therapeutic intervention in order to improve the following deficits and impairments:  Increased edema, Decreased  strength, Pain, Impaired flexibility, Increased muscle spasms, Decreased range of motion  Visit Diagnosis: Pain in right thigh  Muscle weakness (generalized)  Stiffness of right knee, not elsewhere classified     Problem List Patient Active Problem List   Diagnosis Date Noted  . Right knee pain 07/12/2015  . Elevated platelet count ([place]) 07/12/2015  . H/O colonoscopy 03/29/2015  . Systemic lupus ([place]) 03/21/2015  . Sickle cell trait ([place]) 03/21/2015  . History of non anemic vitamin B12 deficiency 03/21/2015  . Chest discomfort 03/21/2015  . Chronic fatigue 03/21/2015    Jeral Pinch PT  08/22/2015, 4:56 PM  [facility] [place], [place], [address] Phone: [phone]   Fax:  [phone]  Name: Pam Gomez MRN: [id number] Date of Birth: 1965/03/24

## 2015-08-24 ENCOUNTER — Encounter (INDEPENDENT_AMBULATORY_CARE_PROVIDER_SITE_OTHER): Payer: Self-pay

## 2015-08-24 ENCOUNTER — Encounter: Payer: Self-pay | Admitting: Physical Therapy

## 2015-08-24 ENCOUNTER — Ambulatory Visit (INDEPENDENT_AMBULATORY_CARE_PROVIDER_SITE_OTHER): Admitting: Physical Therapy

## 2015-08-24 DIAGNOSIS — M25661 Stiffness of right knee, not elsewhere classified: Secondary | ICD-10-CM

## 2015-08-24 DIAGNOSIS — M79651 Pain in right thigh: Secondary | ICD-10-CM | POA: Diagnosis not present

## 2015-08-24 DIAGNOSIS — M6281 Muscle weakness (generalized): Secondary | ICD-10-CM

## 2015-08-24 NOTE — Therapy (Signed)
[place], [place], [address] Phone: [phone]   Fax:  [phone]  Physical Therapy Treatment  Patient Details  Name: Pam Gomez MRN: [id number] Date of Birth: 1965/06/13 Referring Provider: Dr Emeterio Reeve  Encounter Date: 08/24/2015      PT End of Session - 08/24/15 1610    Visit Number 5   Number of Visits 12   Date for PT Re-Evaluation 09/15/15   PT Start Time [id number]   PT Stop Time [id number]   PT Time Calculation (min) 48 min   Activity Tolerance Patient tolerated treatment well      History reviewed. No pertinent past medical history.  Past Surgical History:  Procedure Laterality Date  . CESAREAN SECTION  2002  . QUADRICEPS TENDON REPAIR  07/06/2000    There were no vitals filed for this visit.      Subjective Assessment - 08/24/15 1611    Subjective The top piece of tape has come off, was sore this AM, not so much this afternoon   Currently in Pain? Yes   Pain Score 2   was 5/10 this AM   Pain Location Knee   Pain Orientation Right   Pain Descriptors / Indicators Aching   Pain Type Acute pain                         [facility] Adult PT Treatment/Exercise - 08/24/15 0001      Knee/Hip Exercises: Stretches   Quad Stretch Right;30 seconds   Piriformis Stretch Right;30 seconds     Knee/Hip Exercises: Aerobic   Recumbent Bike L2 x 6'  started riding, knee was sore, reapplied tape then good     Knee/Hip Exercises: Standing   Wall Squat 5 seconds;10 reps  wt through heels     Knee/Hip Exercises: Supine   Bridges Limitations 5 regular   Single Leg Bridge Strengthening;Right;2 sets;10 reps  eccentric lowering     Knee/Hip Exercises: Sidelying   Clams 30reps reverse clams, 15 reps regular     Manual Therapy   Kinesiotex Facilitate Muscle  to improve patellar alignment                     PT Long Term Goals - 08/22/15 1654      PT LONG  TERM GOAL #1   Title I with advanced HEP ( 09/15/15)    Time 8   Period Weeks   Status On-going     PT LONG TERM GOAL #2   Title be able to alternate stairs with good quad control and no pain ( 09/15/15)    Time 8   Period Weeks   Status On-going     PT LONG TERM GOAL #3   Title increase Rt hip and knee strength =/> 5-/5 (09/15/15)    Time 8   Period Weeks   Status On-going     PT LONG TERM GOAL #4   Title demo Rt prone quad flexibility =/> 125 degrees ( 09/15/15)    Time 8   Period Weeks   Status On-going     PT LONG TERM GOAL #5   Title improve FOTO =/< 37% limited ( 09/15/15)    Time 8   Period Weeks   Status On-going               Plan - 08/24/15 1657    Clinical Impression Statement Pam Gomez has great relief with tape  to realign her patella. She is getting stronger   Rehab Potential Good   PT Frequency 2x / week   PT Duration 6 weeks   PT Treatment/Interventions Manual techniques;Therapeutic exercise;Moist Heat;Vasopneumatic Device;Taping;Therapeutic activities;Iontophoresis 4mg /ml Dexamethasone;Electrical Stimulation;Dry needling;Stair training;Cryotherapy;Patient/family education;Ultrasound;Neuromuscular re-education   PT Next Visit Plan LE strengthening, proprioception   Consulted and Agree with Plan of Care Patient      Patient will benefit from skilled therapeutic intervention in order to improve the following deficits and impairments:  Increased edema, Decreased strength, Pain, Impaired flexibility, Increased muscle spasms, Decreased range of motion  Visit Diagnosis: Pain in right thigh  Muscle weakness (generalized)  Stiffness of right knee, not elsewhere classified     Problem List Patient Active Problem List   Diagnosis Date Noted  . Right knee pain 07/12/2015  . Elevated platelet count ([place]) 07/12/2015  . H/O colonoscopy 03/29/2015  . Systemic lupus ([place]) 03/21/2015  . Sickle cell trait ([place]) 03/21/2015  . History of non anemic vitamin B12  deficiency 03/21/2015  . Chest discomfort 03/21/2015  . Chronic fatigue 03/21/2015    Jeral Pinch PT  08/24/2015, 4:58 PM  [facility] [place], [place], [address] Phone: [phone]   Fax:  [phone]  Name: Pam Gomez MRN: [id number] Date of Birth: 1965/11/25

## 2015-08-29 ENCOUNTER — Encounter: Payer: Self-pay | Admitting: Rehabilitative and Restorative Service Providers"

## 2015-08-29 ENCOUNTER — Encounter: Payer: Self-pay | Admitting: Osteopathic Medicine

## 2015-08-29 ENCOUNTER — Ambulatory Visit (INDEPENDENT_AMBULATORY_CARE_PROVIDER_SITE_OTHER): Admitting: Osteopathic Medicine

## 2015-08-29 ENCOUNTER — Ambulatory Visit (INDEPENDENT_AMBULATORY_CARE_PROVIDER_SITE_OTHER): Admitting: Rehabilitative and Restorative Service Providers"

## 2015-08-29 VITALS — BP 134/80 | HR 79 | Ht 63.0 in | Wt 193.0 lb

## 2015-08-29 DIAGNOSIS — M6281 Muscle weakness (generalized): Secondary | ICD-10-CM

## 2015-08-29 DIAGNOSIS — M25661 Stiffness of right knee, not elsewhere classified: Secondary | ICD-10-CM

## 2015-08-29 DIAGNOSIS — M25561 Pain in right knee: Secondary | ICD-10-CM | POA: Diagnosis not present

## 2015-08-29 DIAGNOSIS — M79651 Pain in right thigh: Secondary | ICD-10-CM

## 2015-08-29 NOTE — Progress Notes (Signed)
HPI: Pam Gomez is a 50 y.o. Not Hispanic or Latino female  who presents to [facility] today, 08/29/15,  for chief complaint of:  Chief Complaint  Patient presents with  . 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 Due to history of quadriceps tendon rupture, chronic pain in the knee, postsurgical changes, she is unable to complete this portion of the exam however has no problem with the other components as outlined in the form that she brought me.  Patient notes that her history of sickle cell trait, systemic lupus has never impaired her ability to perform her duties. She states that, if possible, she would rather not disclose this health information to the [employer].   Patient is very concerned about health information privacy. She states that she would rather myself and one of the medical assistants, Isaias Cowman (who she has spoken with in the past and feels comfortable with), be the only people who have access to her medical records. See below for discussion on this issue.  Past medical, surgical, social and family history reviewed: No past medical history on file. Past Surgical History:  Procedure Laterality Date  . CESAREAN SECTION  2002  . QUADRICEPS TENDON REPAIR  07/06/2000   Social History  Substance Use Topics  . Smoking status: Never Smoker  . Smokeless tobacco: Never Used  . Alcohol use No   Family History  Problem Relation Age of Onset  . Hypertension Father   . Diabetes Other   . Depression Paternal Uncle   . Hypertension Paternal Uncle   . Diabetes Paternal Grandmother   . Hypertension Paternal Grandmother   . Stroke Paternal Grandmother   . Alcohol abuse Paternal Grandfather   . Hypertension Paternal  Grandfather   . Heart attack Paternal Aunt      Current medication list and allergy/intolerance information reviewed:   Current Outpatient Prescriptions  Medication Sig Dispense Refill  . hydroxychloroquine (PLAQUENIL) 200 MG tablet     . Vitamin D, Ergocalciferol, (DRISDOL) 50000 units CAPS capsule Take by mouth.     No current facility-administered medications for this visit.    Allergies  Allergen Reactions  . Iodinated Diagnostic Agents Nausea And Vomiting  . Penicillins Hives      Review of Systems:  Constitutional:   No significant fatigue.   HEENT: No  headache,   Cardiac: No  chest pain,   Respiratory:  No  shortness of breath  Gastrointestinal: No  abdominal pain,   Musculoskeletal: No new myalgia/arthralgia  Psychiatric: No  concerns with depression, No  concerns with anxiety   Exam:  BP 134/80   Pulse 79   Ht 5\' 3"  (1.6 m)   Wt 193 lb (87.5 kg)   LMP 11/08/2011   BMI 34.19 kg/m   Constitutional: VS see above. General Appearance: alert, well-developed, well-nourished, NAD  Neck: No masses, trachea midline.   Respiratory: Normal respiratory effort.  Cardiovascular: No lower extremity edema.   Musculoskeletal: Gait normal. No clubbing/cyanosis of digits. R knee ligaments stable - neg drawer and/post, neg varus/valgus, (+) crepitus  Neurological: No cranial nerve deficit on limited exam. Motor and sensation intact and symmetric. Cerebellar reflexes grossly intact. Normal balance/coordination. No tremor.   Skin: warm, dry, intact.    Psychiatric: Normal  judgment/insight. Normal mood and affect. Oriented x3.    ASSESSMENT/PLAN:   Happy to fill out the paperwork requested by the patient. Of course will document appropriately that limitations due to chronic knee problems limit her ability to complete 2 mile run for training test as noted.   She has never had limitations from her sickle cell trait, systemic lupus, however she is taking medications  for Lupus and there of course could be limitations to her performance in the future.  Paperwork asks specifically about current limitations but also all diagnoses, and although patient would rather not disclose this information, I advised her that I would be complete and honest on the forms about the diagnoses as it asks me to list them based on my interpretation of the paperwork. I will of course document that these other diagnoses do not cause her any limitations which would affect her work ability, at least not at this point.  Regarding patient's concerns about privacy of her medical information, a sugar that we take patient information privacy very seriously and we take a HIPAA compliance very seriously. However, staff members other than myself and Seth Bake will have access to her chart if it is involving billing, direct patient care, other functions as outlined by HIPAA and [place]. I directed her to our practice administrator, Lacretia Nicks, for any further questions about our privacy policies and advised her that she should have signed something to the effect of the understanding of our privacy policies when she became a patient here but we are happy to answer any questions for her or address any other concerns.  Right knee pain     Visit summary with medication list and pertinent instructions was printed for patient to review. All questions at time of visit were answered - patient instructed to contact office with any additional concerns. ER/RTC precautions were reviewed with the patient. Follow-up plan: Return as needed & for annual physical 06/2016.  Note: Total time spent 25 minutes, greater than 50% of the visit was spent face-to-face counseling and coordinating care for the following: The encounter diagnosis was Right knee pain.Marland Kitchen

## 2015-08-29 NOTE — Patient Instructions (Signed)
I'm happy to fill out the necessary paperwork as we discussed in the clinic today. I will complete everything honestly and accurately. I will do a bit of research to confirm that your other diagnosis of lupus & sickle cell trait do not need to be disclosed. I do not think it needs to be reported since these conditions have not so far affected your physical performance/abilities, however, since they may in the future, I will need to double check if this is something that needs to be reported. If for any reason my research would indicate any need for disclosure of this, I will of course alert you before completing any of the paperwork and we can talk about this more.   Please contact our office and ask to speak to Lacretia Nicks regarding our privacy policy - in particular, which staff members at our clinic and others within the [facility] system may have access to your medical record as needed for direct patient care, billing purposes, and other functions as outlined by HIPAA privacy laws. Abigail Butts is our Glass blower/designer and would be able to explain everything to you in detail better than I could.   Please let us know if there is anything else we can do for you! Otherwise, let's plan to follow-up for repeat annual physical next June, 2018.

## 2015-08-29 NOTE — Therapy (Signed)
[place], [place], [address] Phone: [phone]   Fax:  [phone]  Physical Therapy Treatment  Patient Details  Name: Pam Gomez MRN: [id number] Date of Birth: Dec 04, 1965 Referring Provider: Dr Emeterio Reeve  Encounter Date: 08/29/2015      PT End of Session - 08/29/15 1608    Visit Number 6   Number of Visits 12   Date for PT Re-Evaluation 09/15/15   PT Start Time [id number]  pt 8 min late for appt    PT Stop Time [id number]   PT Time Calculation (min) 50 min   Activity Tolerance Patient tolerated treatment well      History reviewed. No pertinent past medical history.  Past Surgical History:  Procedure Laterality Date  . CESAREAN SECTION  2002  . QUADRICEPS TENDON REPAIR  07/06/2000    There were no vitals filed for this visit.                       [facility] Adult PT Treatment/Exercise - 08/29/15 0001      Knee/Hip Exercises: Stretches   Quad Stretch Right;30 seconds   Piriformis Stretch Right;30 seconds     Knee/Hip Exercises: Standing   SLS with Vectors SLS with forward touck Lt to Rt UE diagonally to touch lowered table x 10    Other Standing Knee Exercises sit to stand x10; sstand to partial sit not touching surface then straightening up x 10 hold 5-10 sec in flexed positions; sit to stand with Rt LE back x 10      Moist Heat Therapy   Number Minutes Moist Heat 15 Minutes   Moist Heat Location Knee  Rt     Electrical Stimulation   Electrical Stimulation Location Rt quad   Electrical Stimulation Action IFC   Electrical Stimulation Parameters to tolerance   Electrical Stimulation Goals Tone;Pain     Manual Therapy   Manual Therapy Soft tissue mobilization   Soft tissue mobilization Rt quad    Kinesiotex Facilitate Muscle  to improve patellar alignment          Trigger Point Dry Needling - 08/29/15 1647    Consent Given? Yes   Muscles Treated Lower  Body --  distal 1/3 of Rt quads   Quadriceps Response Palpable increased muscle length  Rt - dense tightness noted in deep fibers of muscle               PT Education - 08/29/15 1635    Education provided Yes   Education Details HEP   Person(s) Educated Patient   Methods Explanation;Demonstration;Tactile cues;Verbal cues;Handout   Comprehension Verbalized understanding;Returned demonstration;Verbal cues required;Tactile cues required             PT Long Term Goals - 08/29/15 1608      PT LONG TERM GOAL #1   Title I with advanced HEP ( 09/15/15)    Time 8   Period Weeks   Status On-going     PT LONG TERM GOAL #2   Title be able to alternate stairs with good quad control and no pain ( 09/15/15)    Time 8   Period Weeks   Status On-going     PT LONG TERM GOAL #3   Title increase Rt hip and knee strength =/> 5-/5 (09/15/15)    Time 8   Period Weeks   Status On-going     PT LONG TERM GOAL #4  Title demo Rt prone quad flexibility =/> 125 degrees ( 09/15/15)    Time 8   Period Weeks   Status On-going     PT LONG TERM GOAL #5   Title improve FOTO =/< 37% limited ( 09/15/15)    Time 8   Period Weeks   Status On-going               Plan - 08/29/15 1626    Clinical Impression Statement TDN and tape continue to provide relief of pain/symptoms. She is working on exercises at home. Feels she is making progress. Working toward stated goals of therapy.    Rehab Potential Good   PT Frequency 2x / week   PT Treatment/Interventions Manual techniques;Therapeutic exercise;Moist Heat;Vasopneumatic Device;Taping;Therapeutic activities;Iontophoresis 4mg /ml Dexamethasone;Electrical Stimulation;Dry needling;Stair training;Cryotherapy;Patient/family education;Ultrasound;Neuromuscular re-education   PT Next Visit Plan LE strengthening, proprioception   Consulted and Agree with Plan of Care Patient      Patient will benefit from skilled therapeutic intervention in order  to improve the following deficits and impairments:  Increased edema, Decreased strength, Pain, Impaired flexibility, Increased muscle spasms, Decreased range of motion  Visit Diagnosis: Pain in right thigh  Muscle weakness (generalized)  Stiffness of right knee, not elsewhere classified     Problem List Patient Active Problem List   Diagnosis Date Noted  . Right knee pain 07/12/2015  . Elevated platelet count ([place]) 07/12/2015  . H/O colonoscopy 03/29/2015  . Systemic lupus ([place]) 03/21/2015  . Sickle cell trait ([place]) 03/21/2015  . History of non anemic vitamin B12 deficiency 03/21/2015  . Chest discomfort 03/21/2015  . Chronic fatigue 03/21/2015    Shakirra Buehler Nilda Simmer PT, MPH  08/29/2015, 4:50 PM  [facility] [place], [place], [address] Phone: [phone]   Fax:  [phone]  Name: Raziyah Imbert MRN: [id number] Date of Birth: 12/02/1965

## 2015-08-29 NOTE — Patient Instructions (Addendum)
Sit to Stand (Sitting)    Sit on ball. Tighten pelvic floor and hold. Lean forward. Stand up. Repeat _10__ times. Do _1__ times a day.  Partial sit to stand Almost sit to touch the chair then push back up straight Hold for 10-15 sec 5-10 reps  Then straighten back up  Keep weight equal on both legs   Sit to stand  Bring the right foot back so you are pushing up more with Rt LE  10 reps    Balance / Reach    Stand on left foot, Holding _0-1___ pound weight in other hand. Bend knee, lowering body, and reach across. Hold _5-10___ seconds. Relax. Repeat __10__ times per set. Do _1___ sessions per day.

## 2015-08-30 ENCOUNTER — Encounter: Payer: Self-pay | Admitting: Osteopathic Medicine

## 2015-09-01 ENCOUNTER — Encounter: Admitting: Physical Therapy

## 2015-09-07 ENCOUNTER — Ambulatory Visit (INDEPENDENT_AMBULATORY_CARE_PROVIDER_SITE_OTHER): Admitting: Physical Therapy

## 2015-09-07 ENCOUNTER — Encounter (INDEPENDENT_AMBULATORY_CARE_PROVIDER_SITE_OTHER): Payer: Self-pay

## 2015-09-07 DIAGNOSIS — M25661 Stiffness of right knee, not elsewhere classified: Secondary | ICD-10-CM | POA: Diagnosis not present

## 2015-09-07 DIAGNOSIS — M79651 Pain in right thigh: Secondary | ICD-10-CM | POA: Diagnosis not present

## 2015-09-07 DIAGNOSIS — M6281 Muscle weakness (generalized): Secondary | ICD-10-CM | POA: Diagnosis not present

## 2015-09-07 NOTE — Therapy (Signed)
[place], [place], [address] Phone: [phone]   Fax:  [phone]  Physical Therapy Treatment  Patient Details  Name: Pam Gomez MRN: [id number] Date of Birth: 08-07-1965 Referring Provider: Dr Emeterio Reeve  Encounter Date: 09/07/2015      PT End of Session - 09/07/15 1605    Visit Number 7   Number of Visits 12   Date for PT Re-Evaluation 09/15/15   PT Start Time 2947   PT Stop Time 6546   PT Time Calculation (min) 40 min   Activity Tolerance Patient tolerated treatment well      No past medical history on file.  Past Surgical History:  Procedure Laterality Date  . CESAREAN SECTION  2002  . QUADRICEPS TENDON REPAIR  07/06/2000    There were no vitals filed for this visit.      Subjective Assessment - 09/07/15 1605    Subjective Pt reports she is doing well, the tape is still on and she thinks it it still helping.  No pain today however had some pain the other day, only feels tight today. She has tried to do some faster walking and when the tape is fresh   Currently in Pain? No/denies            [facility] PT Assessment - 09/07/15 0001      Assessment   Medical Diagnosis Rt knee pain    Referring Provider Dr Emeterio Reeve   Onset Date/Surgical Date 07/07/14   Hand Dominance Right   Next MD Visit PRN   Prior Therapy ended in Feb 2017     Strength   Right Hip Flexion 5/5   Right Hip Extension 4+/5   Right Hip ABduction 5/5   Right Knee Flexion 5/5   Right Knee Extension --  5-/5     Flexibility   Quadriceps Rt 121                     [facility] Adult PT Treatment/Exercise - 09/07/15 0001      Knee/Hip Exercises: Stretches   Sports administrator Right;1 rep;30 seconds  with strap   Other Knee/Hip Stretches 10 reps inner/outer thigh stetches     Knee/Hip Exercises: Aerobic   Nustep L5x5     Knee/Hip Exercises: Standing   Side Lunges Both;2 sets;10 reps   curtsey lunges   Functional Squat 2 sets  8 reps sumo squats     Knee/Hip Exercises: Supine   Bridges Limitations 3x5, feet on ball with HS curls     Knee/Hip Exercises: Sidelying   Hip ABduction Strengthening;Right;10 reps  each, pilates leg swings and taps with arch   Hip ADduction Strengthening;Right;3 sets  8 reps                     PT Long Term Goals - 09/07/15 1610      PT LONG TERM GOAL #1   Title I with advanced HEP ( 09/15/15)    Status On-going     PT LONG TERM GOAL #2   Title be able to alternate stairs with good quad control and no pain ( 09/15/15)    Status Achieved  with tape     PT LONG TERM GOAL #3   Title increase Rt hip and knee strength =/> 5-/5 (09/15/15)    Status Partially Met  all but hip extension     PT LONG TERM GOAL #4   Title demo  Rt prone quad flexibility =/> 125 degrees ( 09/15/15)    Status On-going     PT LONG TERM GOAL #5   Title improve FOTO =/< 37% limited ( 09/15/15)    Time 8   Period Weeks   Status On-going               Plan - 09/07/15 1639    Clinical Impression Statement Magdalyn continues to make progress, she has met some of her goals.  Responding well to ther ex and tape.  Recommend she try without the tape later in the week to see if she still has reduced pain.    Rehab Potential Good   PT Frequency 2x / week   PT Duration 6 weeks   PT Treatment/Interventions Manual techniques;Therapeutic exercise;Moist Heat;Vasopneumatic Device;Taping;Therapeutic activities;Iontophoresis 5m/ml Dexamethasone;Electrical Stimulation;Dry needling;Stair training;Cryotherapy;Patient/family education;Ultrasound;Neuromuscular re-education   PT Next Visit Plan LE strengthening, proprioception, try with out tape if she hasn;t yet   Consulted and Agree with Plan of Care Patient      Patient will benefit from skilled therapeutic intervention in order to improve the following deficits and impairments:  Increased edema, Decreased  strength, Pain, Impaired flexibility, Increased muscle spasms, Decreased range of motion  Visit Diagnosis: Stiffness of right knee, not elsewhere classified  Muscle weakness (generalized)  Pain in right thigh     Problem List Patient Active Problem List   Diagnosis Date Noted  . Right knee pain 07/12/2015  . Elevated platelet count ([place] 07/12/2015  . H/O colonoscopy 03/29/2015  . Systemic lupus ([place] 03/21/2015  . Sickle cell trait ([place] 03/21/2015  . History of non anemic vitamin B12 deficiency 03/21/2015  . Chest discomfort 03/21/2015  . Chronic fatigue 03/21/2015    SJeral PinchPT  09/07/2015, 4:45 PM  [facility] [address]Phone: [phone]  Fax:  [phone] Name: LIkeya BrockelMRN: [id number] of Birth: 5May 22, 1967

## 2015-09-09 ENCOUNTER — Encounter: Admitting: Rehabilitative and Restorative Service Providers"

## 2015-09-15 ENCOUNTER — Encounter: Admitting: Physical Therapy

## 2015-09-21 ENCOUNTER — Ambulatory Visit (INDEPENDENT_AMBULATORY_CARE_PROVIDER_SITE_OTHER): Admitting: Physical Therapy

## 2015-09-21 ENCOUNTER — Encounter: Payer: Self-pay | Admitting: Physical Therapy

## 2015-09-21 DIAGNOSIS — M25661 Stiffness of right knee, not elsewhere classified: Secondary | ICD-10-CM

## 2015-09-21 DIAGNOSIS — M6281 Muscle weakness (generalized): Secondary | ICD-10-CM

## 2015-09-21 DIAGNOSIS — M79651 Pain in right thigh: Secondary | ICD-10-CM | POA: Diagnosis not present

## 2015-09-21 NOTE — Therapy (Signed)
[place], [place], [address] Phone: [phone]   Fax:  [phone]  Physical Therapy Treatment  Patient Details  Name: Pam Gomez MRN: [id number] Date of Birth: 01/23/1966 Referring Provider: Dr Raynaldo Opitz  Encounter Date: 09/21/2015      PT End of Session - 09/21/15 1510    Visit Number 7   Number of Visits 19   Date for PT Re-Evaluation 11/02/15   PT Start Time 6962   PT Stop Time 1605   PT Time Calculation (min) 50 min      History reviewed. No pertinent past medical history.  Past Surgical History:  Procedure Laterality Date  . CESAREAN SECTION  2002  . QUADRICEPS TENDON REPAIR  07/06/2000    There were no vitals filed for this visit.      Subjective Assessment - 09/21/15 1515    Subjective Pt has had intermittent pain, sharp at times and some stiffness since she was here last. Has been performing her HEP.    Currently in Pain? Yes   Pain Score 4    Pain Location Knee   Pain Orientation Right  anterior   Pain Descriptors / Indicators Pressure   Pain Type Acute pain   Pain Onset More than a month ago   Aggravating Factors  various activities   Pain Relieving Factors ice            [facility] PT Assessment - 09/21/15 0001      Assessment   Medical Diagnosis Rt knee pain    Referring Provider Dr Raynaldo Opitz   Onset Date/Surgical Date 07/07/14   Hand Dominance Right   Next MD Visit PRN   Prior Therapy ended in Feb 2017     Observation/Other Assessments   Focus on Therapeutic Outcomes (FOTO)  45% limited     ROM / Strength   AROM / PROM / Strength AROM;Strength     AROM   AROM Assessment Site Knee     Strength   Strength Assessment Site Hip;Knee   Right Hip Flexion 5/5   Right Hip Extension 4+/5   Right Hip ABduction 5/5   Right Knee Flexion 5/5   Right Knee Extension --  5-/5, fair (+) eccentric rt quad with steps and equipment     Flexibility    Quadriceps Rt 125                     [facility] Adult PT Treatment/Exercise - 09/21/15 0001      Self-Care   Self-Care Other Self-Care Comments   Other Self-Care Comments  perform leg press and knee ext with Rt LE only     Exercises   Exercises Knee/Hip     Knee/Hip Exercises: Stretches   Pension scheme manager reps;30 seconds     Knee/Hip Exercises: Aerobic   Nustep L5x5     Knee/Hip Exercises: Machines for Strengthening   Cybex Leg Press 3x10 Rt LE only with 50#, seat on 3     Knee/Hip Exercises: Standing   Wall Squat 10 reps  then 10 reps marching    Other Standing Knee Exercises stand to sit 3x8, Rt LE focus on eccentric     Knee/Hip Exercises: Seated   Long Arc [employer] Strengthening;Right;3 sets  8 reps   Long Arc Quad Weight 10 lbs.     Knee/Hip Exercises: Supine   Bridges Limitations 2x10 feet on ball with HS curls  PT Long Term Goals - 09/21/15 1508      PT LONG TERM GOAL #1   Title I with advanced HEP ( 11/02/15)    Time 6   Period Weeks   Status On-going     PT LONG TERM GOAL #2   Title be able to alternate stairs with good quad control and no pain ( 11/02/15)    Status Partially Met  pt reports 55-60% improvement on stairs, ascend more difficult     PT LONG TERM GOAL #3   Title increase Rt hip and knee strength =/> 5-/5 (11/02/15)    Time 6   Period Weeks   Status Partially Met     PT LONG TERM GOAL #4   Title demo Rt prone quad flexibility =/> 125 degrees ( 11/02/15)    Time 6   Period Weeks   Status On-going     PT LONG TERM GOAL #5   Title improve FOTO =/< 37% limited ( 11/02/15)    Time 6   Period Weeks   Status On-going  scored 45% limited               Plan - 09/21/15 1558    Clinical Impression Statement Laramie is making improvements, Did well without the tape for a couple of days. She has high level weakness in her Rt quad and some pain. Trigger points in the Rt quad are reduced to mininal  now.  She has not been able to atten all sessions due to her schedule and would benefit from more treatment to progress to higher level strengthening and interval fast walking.    Rehab Potential Good   PT Frequency 2x / week   PT Duration 6 weeks   PT Treatment/Interventions Manual techniques;Therapeutic exercise;Moist Heat;Vasopneumatic Device;Taping;Therapeutic activities;Iontophoresis 36m/ml Dexamethasone;Electrical Stimulation;Dry needling;Stair training;Cryotherapy;Patient/family education;Ultrasound;Neuromuscular re-education   PT Next Visit Plan tape Rt knee for patellar tracking if pain with stairs, she will monitor more over the next couple of days.  progress higher level quad strengthening and re-assess for TDN to Rt quad.       Patient will benefit from skilled therapeutic intervention in order to improve the following deficits and impairments:  Increased edema, Decreased strength, Pain, Impaired flexibility, Increased muscle spasms, Decreased range of motion  Visit Diagnosis: Stiffness of right knee, not elsewhere classified - Plan: PT plan of care cert/re-cert  Muscle weakness (generalized) - Plan: PT plan of care cert/re-cert  Pain in right thigh - Plan: PT plan of care cert/re-cert     Problem List Patient Active Problem List   Diagnosis Date Noted  . Right knee pain 07/12/2015  . Elevated platelet count ([place] 07/12/2015  . H/O colonoscopy 03/29/2015  . Systemic lupus ([place] 03/21/2015  . Sickle cell trait ([place] 03/21/2015  . History of non anemic vitamin B12 deficiency 03/21/2015  . Chest discomfort 03/21/2015  . Chronic fatigue 03/21/2015    SJeral PinchPT  09/21/2015, 5:05 PM  [facility] [address]Phone: [phone]  Fax:  [phone] Name: LHelayne MetskerMRN: [id number] of Birth: [birth year]

## 2015-09-23 ENCOUNTER — Ambulatory Visit (INDEPENDENT_AMBULATORY_CARE_PROVIDER_SITE_OTHER): Admitting: Rehabilitative and Restorative Service Providers"

## 2015-09-23 ENCOUNTER — Encounter: Payer: Self-pay | Admitting: Rehabilitative and Restorative Service Providers"

## 2015-09-23 DIAGNOSIS — M6281 Muscle weakness (generalized): Secondary | ICD-10-CM | POA: Diagnosis not present

## 2015-09-23 DIAGNOSIS — M25661 Stiffness of right knee, not elsewhere classified: Secondary | ICD-10-CM | POA: Diagnosis not present

## 2015-09-23 DIAGNOSIS — M79651 Pain in right thigh: Secondary | ICD-10-CM

## 2015-09-23 NOTE — Therapy (Signed)
[place], [place], [address] Phone: [phone]   Fax:  [phone]  Physical Therapy Treatment  Patient Details  Name: Pam Gomez MRN: [id number] Date of Birth: 30-May-1965 Referring Provider: Dr Raynaldo Opitz  Encounter Date: 09/23/2015      PT End of Session - 09/23/15 1531    Visit Number 8   Number of Visits 19   Date for PT Re-Evaluation 11/02/15   PT Start Time 9024   PT Stop Time 1622   PT Time Calculation (min) 51 min   Activity Tolerance Patient tolerated treatment well      History reviewed. No pertinent past medical history.  Past Surgical History:  Procedure Laterality Date  . CESAREAN SECTION  2002  . QUADRICEPS TENDON REPAIR  07/06/2000    There were no vitals filed for this visit.      Subjective Assessment - 09/23/15 1531    Subjective Increased painin the lower Rt anterior thigh. May have had on the wqrong shoes at work - up and down a lot during the day. May have been not having the tape in place. May have been adding new exercises last visit. Just more pain and discomfort today.  doesn't think she can tolerate the TDN today. Pleased that she could do what she did - shich was more than she thought she could do when she came into therapy.    Currently in Pain? Yes   Pain Score 5    Pain Location Knee   Pain Orientation Right   Pain Descriptors / Indicators Tightness;Aching;Pressure   Pain Type Acute pain                         [facility] Adult PT Treatment/Exercise - 09/23/15 0001      Knee/Hip Exercises: Stretches   Sports administrator Right;30 seconds;3 reps  two with 4" foam roll under distal thigh      Knee/Hip Exercises: Aerobic   Nustep L5x5     Knee/Hip Exercises: Machines for Strengthening   Cybex Leg Press 3x10 Rt LE only with 50#, seat on 3     Knee/Hip Exercises: Standing   Other Standing Knee Exercises stand to sit x 8, Rt LE focus on  eccentric   Other Standing Knee Exercises warrior pose 5 x 10 sec   difficulty getting thigh parallel to floor      Knee/Hip Exercises: Supine   Bridges Limitations 8 single leg bridge Rt      Moist Heat Therapy   Number Minutes Moist Heat 15 Minutes   Moist Heat Location Knee  Rt anterior thigh     Electrical Stimulation   Electrical Stimulation Location Rt quad   Electrical Stimulation Action IFc   Electrical Stimulation Parameters to tolerance   Electrical Stimulation Goals Tone;Pain     Manual Therapy   Manual Therapy Soft tissue mobilization   Soft tissue mobilization Rt quad - laterally    Kinesiotex Facilitate Muscle  improve patellar alignment                      PT Long Term Goals - 09/21/15 1508      PT LONG TERM GOAL #1   Title I with advanced HEP ( 11/02/15)    Time 6   Period Weeks   Status On-going     PT LONG TERM GOAL #2   Title be able to alternate stairs with good  quad control and no pain ( 11/02/15)    Status Partially Met  pt reports 55-60% improvement on stairs, ascend more difficult     PT LONG TERM GOAL #3   Title increase Rt hip and knee strength =/> 5-/5 (11/02/15)    Time 6   Period Weeks   Status Partially Met     PT LONG TERM GOAL #4   Title demo Rt prone quad flexibility =/> 125 degrees ( 11/02/15)    Time 6   Period Weeks   Status On-going     PT LONG TERM GOAL #5   Title improve FOTO =/< 37% limited ( 11/02/15)    Time 6   Period Weeks   Status On-going  scored 45% limited               Plan - 09/23/15 1603    Clinical Impression Statement Flare up of pain and discomfort today. Possibly due to increase in activity(busy week and even busier day), increase in intensity of exercise and leaving off the tape in combination. Tolerated some exercise but decreased frequency and intensity to accomodate to flare up of symptoms. Added tape for the next couple of days. Assess response at next visit.    Rehab Potential  Good   PT Frequency 2x / week   PT Duration 6 weeks   PT Treatment/Interventions Manual techniques;Therapeutic exercise;Moist Heat;Vasopneumatic Device;Taping;Therapeutic activities;Iontophoresis 56m/ml Dexamethasone;Electrical Stimulation;Dry needling;Stair training;Cryotherapy;Patient/family education;Ultrasound;Neuromuscular re-education   PT Next Visit Plan tape Rt knee for patellar tracking if pain with stairs, she will monitor more over the next couple of days.  progress higher level quad strengthening and re-assess for TDN to Rt quad.    Consulted and Agree with Plan of Care Patient      Patient will benefit from skilled therapeutic intervention in order to improve the following deficits and impairments:  Increased edema, Decreased strength, Pain, Impaired flexibility, Increased muscle spasms, Decreased range of motion  Visit Diagnosis: Stiffness of right knee, not elsewhere classified  Muscle weakness (generalized)  Pain in right thigh     Problem List Patient Active Problem List   Diagnosis Date Noted  . Right knee pain 07/12/2015  . Elevated platelet count ([place] 07/12/2015  . H/O colonoscopy 03/29/2015  . Systemic lupus ([place] 03/21/2015  . Sickle cell trait ([place] 03/21/2015  . History of non anemic vitamin B12 deficiency 03/21/2015  . Chest discomfort 03/21/2015  . Chronic fatigue 03/21/2015    Celyn PNilda SimmerPT, MPH  09/23/2015, 4:06 PM  [facility] [address]Phone: [phone]  Fax:  [phone] Name: Pam ChaiMRN: [id number] of Birth: 5September 17, 1967

## 2015-09-28 ENCOUNTER — Encounter: Payer: Self-pay | Admitting: Rehabilitative and Restorative Service Providers"

## 2015-09-28 ENCOUNTER — Ambulatory Visit (INDEPENDENT_AMBULATORY_CARE_PROVIDER_SITE_OTHER): Admitting: Rehabilitative and Restorative Service Providers"

## 2015-09-28 DIAGNOSIS — M79651 Pain in right thigh: Secondary | ICD-10-CM

## 2015-09-28 DIAGNOSIS — M25661 Stiffness of right knee, not elsewhere classified: Secondary | ICD-10-CM

## 2015-09-28 DIAGNOSIS — M6281 Muscle weakness (generalized): Secondary | ICD-10-CM

## 2015-09-28 NOTE — Therapy (Signed)
[place], [place], [address] Phone: [phone]   Fax:  [phone]  Physical Therapy Treatment  Patient Details  Name: Pam Gomez MRN: [id number] Date of Birth: 1965-09-14 Referring Provider: Dr Raynaldo Opitz  Encounter Date: 09/28/2015      PT End of Session - 09/28/15 1614    Visit Number 9   Number of Visits 19   Date for PT Re-Evaluation 11/02/15   PT Start Time 1602   PT Stop Time 1655   PT Time Calculation (min) 53 min   Activity Tolerance Patient tolerated treatment well      History reviewed. No pertinent past medical history.  Past Surgical History:  Procedure Laterality Date  . CESAREAN SECTION  2002  . QUADRICEPS TENDON REPAIR  07/06/2000    There were no vitals filed for this visit.      Subjective Assessment - 09/28/15 1615    Subjective Increased pain in the lower Rt anterior thigh. More pain in the past two days.    Currently in Pain? Yes   Pain Score 5    Pain Location Knee   Pain Orientation Right   Pain Descriptors / Indicators Tightness;Aching   Pain Type Acute pain   Pain Onset More than a month ago   Pain Frequency Constant   Aggravating Factors  various activities    Pain Relieving Factors ice                         [facility] Adult PT Treatment/Exercise - 09/28/15 0001      Knee/Hip Exercises: Stretches   Sports administrator Right;30 seconds;3 reps  two with 4" foam roll under distal thigh      Knee/Hip Exercises: Aerobic   Elliptical L2x 6 min      Knee/Hip Exercises: Machines for Strengthening   Cybex Leg Press 3x10 Rt LE only with 50#, seat on 3     Knee/Hip Exercises: Standing   Wall Squat 10 reps   Other Standing Knee Exercises stand to sit x 8, Rt LE focus on eccentric   Other Standing Knee Exercises warrior pose 5 x 10 sec   difficulty getting thigh parallel to floor      Moist Heat Therapy   Number Minutes Moist Heat 15 Minutes    Moist Heat Location Knee  Rt anterior thigh     Electrical Stimulation   Electrical Stimulation Location Rt quad   Electrical Stimulation Action IFC   Electrical Stimulation Parameters to tolerance   Electrical Stimulation Goals Tone;Pain     Ultrasound   Ultrasound Location Rt quad    Ultrasound Parameters 1.5 w/cm2; 1 mHz; 100%; 8 min    Ultrasound Goals Other (Comment);Pain     Manual Therapy   Manual Therapy Soft tissue mobilization   Soft tissue mobilization Rt quad - laterally and medially   edge tool assisted soft tissue-decrease fascial restrictions                PT Education - 09/28/15 1701    Education provided Yes   Education Details HEP    Person(s) Educated Patient   Methods Explanation   Comprehension Verbalized understanding             PT Long Term Goals - 09/28/15 1617      PT LONG TERM GOAL #1   Title I with advanced HEP ( 11/02/15)    Time 6   Period Weeks  Status On-going     PT LONG TERM GOAL #2   Title be able to alternate stairs with good quad control and no pain ( 11/02/15)    Time 8   Period Weeks   Status Partially Met     PT LONG TERM GOAL #3   Title increase Rt hip and knee strength =/> 5-/5 (11/02/15)    Time 6   Period Weeks   Status Partially Met     PT LONG TERM GOAL #4   Title demo Rt prone quad flexibility =/> 125 degrees ( 11/02/15)    Time 6   Period Weeks   Status On-going     PT LONG TERM GOAL #5   Title improve FOTO =/< 37% limited ( 11/02/15)    Time 6   Period Weeks   Status On-going               Plan - 09/28/15 1702    Clinical Impression Statement Continued flare up of symptoms. Good response to manual therapy and modalities. Good stretch response. Will focus on stretching until next visit.    Rehab Potential Good   PT Frequency 2x / week   PT Duration 6 weeks   PT Treatment/Interventions Manual techniques;Therapeutic exercise;Moist Heat;Vasopneumatic Device;Taping;Therapeutic  activities;Iontophoresis 107m/ml Dexamethasone;Electrical Stimulation;Dry needling;Stair training;Cryotherapy;Patient/family education;Ultrasound;Neuromuscular re-education   PT Next Visit Plan tape Rt knee for patellar tracking if pain with stairs, she will monitor more over the next couple of days.  progress higher level quad strengthening and re-assess for TDN to Rt quad.    Consulted and Agree with Plan of Care Patient      Patient will benefit from skilled therapeutic intervention in order to improve the following deficits and impairments:  Increased edema, Decreased strength, Pain, Impaired flexibility, Increased muscle spasms, Decreased range of motion  Visit Diagnosis: Stiffness of right knee, not elsewhere classified  Muscle weakness (generalized)  Pain in right thigh     Problem List Patient Active Problem List   Diagnosis Date Noted  . Right knee pain 07/12/2015  . Elevated platelet count ([facility] 07/12/2015  . H/O colonoscopy 03/29/2015  . Systemic lupus ([place] 03/21/2015  . Sickle cell trait ([place] 03/21/2015  . History of non anemic vitamin B12 deficiency 03/21/2015  . Chest discomfort 03/21/2015  . Chronic fatigue 03/21/2015    CEverardo AllPT,MPH  09/28/2015, 5:04 PM  [facility] [address]Phone: [phone]  Fax:  [phone] Name: Pam ThiemannMRN: [id number] of Birth: 5Aug 12, 1967

## 2015-09-29 ENCOUNTER — Ambulatory Visit (INDEPENDENT_AMBULATORY_CARE_PROVIDER_SITE_OTHER): Admitting: Rehabilitative and Restorative Service Providers"

## 2015-09-29 ENCOUNTER — Encounter: Payer: Self-pay | Admitting: Rehabilitative and Restorative Service Providers"

## 2015-09-29 DIAGNOSIS — M25661 Stiffness of right knee, not elsewhere classified: Secondary | ICD-10-CM

## 2015-09-29 DIAGNOSIS — M6281 Muscle weakness (generalized): Secondary | ICD-10-CM | POA: Diagnosis not present

## 2015-09-29 DIAGNOSIS — M79651 Pain in right thigh: Secondary | ICD-10-CM

## 2015-09-29 NOTE — Therapy (Signed)
[facility] [address] [place], [place], [address] Phone: [phone]   Fax:  [phone]  Physical Therapy Treatment  Patient Details  Name: Pam Gomez MRN: [id number] Date of Birth: 02/13/1965 Referring Provider: Dr N Alexander  Encounter Date: 09/29/2015      PT End of Session - 09/29/15 1625    Visit Number 10   Number of Visits 19   Date for PT Re-Evaluation 11/02/15   PT Start Time 1619   PT Stop Time 1712   PT Time Calculation (min) 53 min   Activity Tolerance Patient tolerated treatment well      History reviewed. No pertinent past medical history.  Past Surgical History:  Procedure Laterality Date  . CESAREAN SECTION  2002  . QUADRICEPS TENDON REPAIR  07/06/2000    There were no vitals filed for this visit.      Subjective Assessment - 09/29/15 1628    Subjective some improvement in pain and discomfort following treatment yesterday.    Currently in Pain? Yes   Pain Score 4    Pain Location Knee   Pain Orientation Right   Pain Descriptors / Indicators Tightness;Aching   Pain Onset More than a month ago   Pain Frequency Constant                         [facility] Adult PT Treatment/Exercise - 09/29/15 0001      Knee/Hip Exercises: Stretches   Quad Stretch Right;30 seconds;3 reps  all with 4" foam roll under distal thigh before/after ex      Knee/Hip Exercises: Aerobic   Recumbent Bike L3 x 5 min      Knee/Hip Exercises: Machines for Strengthening   Cybex Leg Press 3x10 Rt LE only with 50#, seat on 3     Knee/Hip Exercises: Standing   Wall Squat 2 sets;4 sets   Other Standing Knee Exercises warrior pose 5 x 10 sec   difficulty getting thigh parallel to floor      Moist Heat Therapy   Number Minutes Moist Heat 15 Minutes   Moist Heat Location Knee  Rt anterior thigh     Electrical Stimulation   Electrical Stimulation Location Rt quad   Electrical Stimulation Action  IFC   Electrical Stimulation Parameters to tolerance   Electrical Stimulation Goals Tone;Pain     Ultrasound   Ultrasound Location Rt distal quad   Ultrasound Parameters 1.5 w/cm2; 1 mHz; 100%; 8 min    Ultrasound Goals Other (Comment);Pain     Manual Therapy   Manual Therapy Soft tissue mobilization   Soft tissue mobilization Rt quad - laterally and medially   edge tool assisted soft tissue-decrease fascial restrictions                PT Education - 09/28/15 1701    Education provided Yes   Education Details HEP    Person(s) Educated Patient   Methods Explanation   Comprehension Verbalized understanding             PT Long Term Goals - 09/28/15 1617      PT LONG TERM GOAL #1   Title I with advanced HEP ( 11/02/15)    Time 6   Period Weeks   Status On-going     PT LONG TERM GOAL #2   Title be able to alternate stairs with good quad control and no pain ( 11/02/15)    Time 8     Period Weeks   Status Partially Met     PT LONG TERM GOAL #3   Title increase Rt hip and knee strength =/> 5-/5 (11/02/15)    Time 6   Period Weeks   Status Partially Met     PT LONG TERM GOAL #4   Title demo Rt prone quad flexibility =/> 125 degrees ( 11/02/15)    Time 6   Period Weeks   Status On-going     PT LONG TERM GOAL #5   Title improve FOTO =/< 37% limited ( 11/02/15)    Time 6   Period Weeks   Status On-going               Plan - 09/29/15 1629    Clinical Impression Statement Some improvement in pain and discomfort following treatment yesterday. Responded well with treatment focus on improvement in tissue extensibility.       Patient will benefit from skilled therapeutic intervention in order to improve the following deficits and impairments:     Visit Diagnosis: Stiffness of right knee, not elsewhere classified  Muscle weakness (generalized)  Pain in right thigh     Problem List Patient Active Problem List   Diagnosis Date Noted  . Right knee  pain 07/12/2015  . Elevated platelet count (HCC) 07/12/2015  . H/O colonoscopy 03/29/2015  . Systemic lupus (HCC) 03/21/2015  . Sickle cell trait (HCC) 03/21/2015  . History of non anemic vitamin B12 deficiency 03/21/2015  . Chest discomfort 03/21/2015  . Chronic fatigue 03/21/2015    Celyn P Holt PT, MPH  09/29/2015, 5:19 PM  [facility] [address] [place], , [address] Phone: [phone]   Fax:  [phone]  Name: Pam Gomez MRN: [id number] Date of Birth: 05/23/1965    

## 2015-10-05 ENCOUNTER — Ambulatory Visit (INDEPENDENT_AMBULATORY_CARE_PROVIDER_SITE_OTHER): Admitting: Physical Therapy

## 2015-10-05 DIAGNOSIS — M25661 Stiffness of right knee, not elsewhere classified: Secondary | ICD-10-CM

## 2015-10-05 DIAGNOSIS — M6281 Muscle weakness (generalized): Secondary | ICD-10-CM

## 2015-10-05 DIAGNOSIS — M79651 Pain in right thigh: Secondary | ICD-10-CM | POA: Diagnosis not present

## 2015-10-05 NOTE — Therapy (Signed)
[facility] [address] [place], [place], [address] Phone: [phone]   Fax:  [phone]  Physical Therapy Treatment  Patient Details  Name: Pam Gomez MRN: [id number] Date of Birth: 02/16/1965 Referring Provider: Dr N Alexander  Encounter Date: 10/05/2015      PT End of Session - 10/05/15 1514    Visit Number 11   Number of Visits 19   Date for PT Re-Evaluation 11/02/15   PT Start Time 1514   PT Stop Time 1616   PT Time Calculation (min) 62 min   Activity Tolerance Patient tolerated treatment well      No past medical history on file.  Past Surgical History:  Procedure Laterality Date  . CESAREAN SECTION  2002  . QUADRICEPS TENDON REPAIR  07/06/2000    There were no vitals filed for this visit.      Subjective Assessment - 10/05/15 1517    Subjective Pam Gomez said her pain is settling down a little,    Currently in Pain? Yes   Pain Score 5    Pain Location Knee   Pain Orientation Right   Pain Descriptors / Indicators Aching   Pain Type Acute pain   Pain Frequency Intermittent   Aggravating Factors  rain and over use   Pain Relieving Factors ice                         [facility] Adult PT Treatment/Exercise - 10/05/15 0001      Knee/Hip Exercises: Aerobic   Nustep L5x5     Knee/Hip Exercises: Standing   Other Standing Knee Exercises stand to sit Rt LE only to facilitate eccentric control      Moist Heat Therapy   Number Minutes Moist Heat 15 Minutes   Moist Heat Location Knee     Electrical Stimulation   Electrical Stimulation Location Rt quad   Electrical Stimulation Action IFC    Electrical Stimulation Parameters to tolerance   Electrical Stimulation Goals Tone;Pain     Ultrasound   Ultrasound Location Rt distal quad   Ultrasound Parameters 1.5 w/cm2, 1.0mHz, 100%   Ultrasound Goals --  break up scar tissue     Manual Therapy   Manual Therapy Soft tissue  mobilization;Taping   Soft tissue mobilization Rt quad with instrument assisted lateralis   Kinesiotex Facilitate Muscle  Rt patellar for patellar tracking                     PT Long Term Goals - 10/05/15 1640      PT LONG TERM GOAL #1   Title I with advanced HEP ( 11/02/15)    Status On-going     PT LONG TERM GOAL #2   Title be able to alternate stairs with good quad control and no pain ( 11/02/15)    Status Partially Met     PT LONG TERM GOAL #3   Title increase Rt hip and knee strength =/> 5-/5 (11/02/15)    Status Partially Met     PT LONG TERM GOAL #4   Title demo Rt prone quad flexibility =/> 125 degrees ( 11/02/15)    Status On-going     PT LONG TERM GOAL #5   Title improve FOTO =/< 37% limited ( 11/02/15)    Status On-going               Plan - 10/05/15 1641    Clinical Impression Statement   Pam Gomez's quad pain has settled back down to where it was 2 weeks ago, she is back on track.  Still responds well to taping.  Has scar tissue in the distal quad, somewhat smaller. tolerated instrument assisted STW well. No new goals met.    Rehab Potential Good   PT Frequency 2x / week   PT Duration 6 weeks   PT Treatment/Interventions Manual techniques;Therapeutic exercise;Moist Heat;Vasopneumatic Device;Taping;Therapeutic activities;Iontophoresis 4mg/ml Dexamethasone;Electrical Stimulation;Dry needling;Stair training;Cryotherapy;Patient/family education;Ultrasound;Neuromuscular re-education   PT Next Visit Plan Work more on the elliptical, tape Rt knee for patellar tracking if pain with stairs, she will monitor more over the next couple of days.  progress higher level quad strengthening and re-assess for TDN to Rt quad.    Consulted and Agree with Plan of Care Patient      Patient will benefit from skilled therapeutic intervention in order to improve the following deficits and impairments:  Increased edema, Decreased strength, Pain, Impaired flexibility, Increased  muscle spasms, Decreased range of motion  Visit Diagnosis: Pain in right thigh  Muscle weakness (generalized)  Stiffness of right knee, not elsewhere classified     Problem List Patient Active Problem List   Diagnosis Date Noted  . Right knee pain 07/12/2015  . Elevated platelet count (HCC) 07/12/2015  . H/O colonoscopy 03/29/2015  . Systemic lupus (HCC) 03/21/2015  . Sickle cell trait (HCC) 03/21/2015  . History of non anemic vitamin B12 deficiency 03/21/2015  . Chest discomfort 03/21/2015  . Chronic fatigue 03/21/2015    Susan Shaver PT  10/05/2015, 4:42 PM  [facility] [address] [place], [place], [address] Phone: [phone]   Fax:  [phone]  Name: Pam Gomez MRN: [id number] Date of Birth: 06/24/1965    

## 2015-10-07 ENCOUNTER — Encounter: Admitting: Rehabilitative and Restorative Service Providers"

## 2015-10-13 ENCOUNTER — Ambulatory Visit (INDEPENDENT_AMBULATORY_CARE_PROVIDER_SITE_OTHER): Admitting: Physical Therapy

## 2015-10-13 ENCOUNTER — Encounter: Payer: Self-pay | Admitting: Physical Therapy

## 2015-10-13 DIAGNOSIS — M79651 Pain in right thigh: Secondary | ICD-10-CM | POA: Diagnosis not present

## 2015-10-13 DIAGNOSIS — M25661 Stiffness of right knee, not elsewhere classified: Secondary | ICD-10-CM

## 2015-10-13 DIAGNOSIS — M6281 Muscle weakness (generalized): Secondary | ICD-10-CM

## 2015-10-13 NOTE — Therapy (Signed)
[place], [place], [address] Phone: [phone]   Fax:  [phone]  Physical Therapy Treatment  Patient Details  Name: Pam Gomez MRN: [id number] Date of Birth: [birth year] Referring Provider: Dr Raynaldo Opitz  Encounter Date: 10/13/2015      PT End of Session - 10/13/15 1607    Visit Number 12   Number of Visits 19   Date for PT Re-Evaluation 11/02/15   PT Start Time 5852   PT Stop Time 7782   PT Time Calculation (min) 67 min      History reviewed. No pertinent past medical history.  Past Surgical History:  Procedure Laterality Date  . CESAREAN SECTION  2002  . QUADRICEPS TENDON REPAIR  07/06/2000    There were no vitals filed for this visit.      Subjective Assessment - 10/13/15 1621    Subjective Pam Gomez states she is doing well, had an injection in the Rt knee and that knee has settled down now.    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    Currently in Pain? No/denies                         [facility] Adult PT Treatment/Exercise - 10/13/15 0001      Knee/Hip Exercises: Aerobic   Elliptical L3x5'     Knee/Hip Exercises: Machines for Strengthening   Cybex Leg Press 3x10 Rt LE only 5 plates, seat on 3     Knee/Hip Exercises: Standing   Other Standing Knee Exercises 15 reps suma squats   Other Standing Knee Exercises XTS with pink band FWD step ups,  and side step     Modalities   Modalities Vasopneumatic     Vasopneumatic   Number Minutes Vasopneumatic  15 minutes   Vasopnuematic Location  Knee  Rt   Vasopneumatic Pressure Medium   Vasopneumatic Temperature  3*     Manual Therapy   Kinesiotex Facilitate Muscle  for lateral tracking patella                     PT Long Term Goals - 10/13/15 1622      PT LONG TERM GOAL #1   Title I with advanced HEP ( 11/02/15)     Status On-going     PT LONG TERM GOAL #2   Title be able to alternate stairs with good quad control and no pain ( 11/02/15)    Status Partially Met     PT LONG TERM GOAL #3   Title increase Rt hip and knee strength =/> 5-/5 (11/02/15)    Status Partially Met     PT LONG TERM GOAL #4   Title demo Rt prone quad flexibility =/> 125 degrees ( 11/02/15)    Status On-going     PT LONG TERM GOAL #5   Title improve FOTO =/< 37% limited ( 11/02/15)    Status On-going               Plan - 10/13/15 1713    Clinical Impression Statement Pam Gomez was able to tolerate about 3 days with minimal pain.  She still has some discomfort with higher level quad work ie lunges, squats.  Progressing to her goals.   Rehab Potential Good   PT Frequency 2x / week   PT Duration 6 weeks   PT Treatment/Interventions Manual techniques;Therapeutic exercise;Moist  Heat;Vasopneumatic Device;Taping;Therapeutic activities;Iontophoresis 62m/ml Dexamethasone;Electrical Stimulation;Dry needling;Stair training;Cryotherapy;Patient/family education;Ultrasound;Neuromuscular re-education   PT Next Visit Plan wean off tape.    Consulted and Agree with Plan of Care Patient      Patient will benefit from skilled therapeutic intervention in order to improve the following deficits and impairments:  Increased edema, Decreased strength, Pain, Impaired flexibility, Increased muscle spasms, Decreased range of motion  Visit Diagnosis: Pain in right thigh  Muscle weakness (generalized)  Stiffness of right knee, not elsewhere classified     Problem List Patient Active Problem List   Diagnosis Date Noted  . Right knee pain 07/12/2015  . Elevated platelet count ([place] 07/12/2015  . H/O colonoscopy 03/29/2015  . Systemic lupus ([facility] 03/21/2015  . Sickle cell trait (HH[place] 03/21/2015  . History of non anemic vitamin B12 deficiency 03/21/2015  . Chest discomfort 03/21/2015  . Chronic fatigue 03/21/2015    SJeral PinchPT   10/13/2015, 5:20 PM  [facility] [address]Phone: [phone]  Fax:  [phone] Name: Pam CannadyMRN: [id number] of Birth: [birth year]

## 2015-10-19 ENCOUNTER — Encounter: Payer: Self-pay | Admitting: Rehabilitative and Restorative Service Providers"

## 2015-10-19 ENCOUNTER — Ambulatory Visit (INDEPENDENT_AMBULATORY_CARE_PROVIDER_SITE_OTHER): Admitting: Rehabilitative and Restorative Service Providers"

## 2015-10-19 DIAGNOSIS — M25661 Stiffness of right knee, not elsewhere classified: Secondary | ICD-10-CM | POA: Diagnosis not present

## 2015-10-19 DIAGNOSIS — M6281 Muscle weakness (generalized): Secondary | ICD-10-CM | POA: Diagnosis not present

## 2015-10-19 DIAGNOSIS — M79651 Pain in right thigh: Secondary | ICD-10-CM | POA: Diagnosis not present

## 2015-10-19 NOTE — Therapy (Signed)
[place], [place], [address] Phone: [phone]   Fax:  [phone]  Physical Therapy Treatment  Patient Details  Name: Pam Gomez MRN: [id number] Date of Birth: 1965/07/19 Referring Provider: Dr Emeterio Reeve  Encounter Date: 10/19/2015      PT End of Session - 10/19/15 1653    Visit Number 13   Number of Visits 19   Date for PT Re-Evaluation 11/02/15   PT Start Time 1608   PT Stop Time 1701   PT Time Calculation (min) 53 min   Activity Tolerance Patient tolerated treatment well      History reviewed. No pertinent past medical history.  Past Surgical History:  Procedure Laterality Date  . CESAREAN SECTION  2002  . QUADRICEPS TENDON REPAIR  07/06/2000    There were no vitals filed for this visit.      Subjective Assessment - 10/19/15 1634    Subjective Worked out at the gym this weekend and did well. Soem soreness but pleased with her efforts. Tape still helps.   Currently in Pain? No/denies            [facility] PT Assessment - 10/19/15 0001      Assessment   Medical Diagnosis Rt knee pain    Referring Provider Dr Emeterio Reeve   Onset Date/Surgical Date 07/07/14   Hand Dominance Right   Next MD Visit PRN   Prior Therapy ended in Feb 2017     AROM   Right Knee Flexion 120     Flexibility   Quadriceps Rt 125     Palpation   Palpation comment improving tightness in the distal  vastus lateralis and VMO Rt                      [facility] Adult PT Treatment/Exercise - 10/19/15 0001      Knee/Hip Exercises: Stretches   Sports administrator Right;30 seconds;3 reps  noodle under distal thigh to extent hip-pre/post ex x 3 ea     Knee/Hip Exercises: Aerobic   Nustep L5x5     Knee/Hip Exercises: Machines for Strengthening   Cybex Leg Press 3x10 Rt LE only 6 plates, seat on 3     Knee/Hip Exercises: Standing   Lateral Step Up Right;10 reps  heel tap    Wall  Squat 2 sets;10 reps   Other Standing Knee Exercises 15 reps suma squats   Other Standing Knee Exercises XTS with pink band FWD step ups,  and side step     Manual Therapy   Manual Therapy Soft tissue mobilization   Soft tissue mobilization Rt quad           Trigger Point Dry Needling - 10/19/15 1651    Consent Given? Yes   Quadriceps Response Palpable increased muscle length  2x mid quad proximal to patella ~ 2-3 inches               PT Education - 10/19/15 1653    Education provided Yes   Education Details HEP    Person(s) Educated Patient   Methods Explanation   Comprehension Verbalized understanding             PT Long Term Goals - 10/19/15 1658      PT LONG TERM GOAL #1   Title I with advanced HEP ( 11/02/15)    Time 6   Period Weeks   Status On-going     PT LONG TERM  GOAL #2   Title be able to alternate stairs with good quad control and no pain ( 11/02/15)    Time 8   Period Weeks   Status Partially Met     PT LONG TERM GOAL #3   Title increase Rt hip and knee strength =/> 5-/5 (11/02/15)    Time 6   Period Weeks   Status Partially Met     PT LONG TERM GOAL #4   Title demo Rt prone quad flexibility =/> 125 degrees ( 11/02/15)    Time 6   Period Weeks   Status Partially Met     PT LONG TERM GOAL #5   Title improve FOTO =/< 37% limited ( 11/02/15)    Time 6   Period Weeks   Status On-going               Plan - 10/19/15 1656    Clinical Impression Statement Progressing well with stretching; strengthening; functional activity level including work out at gym. Will leave tape off today and assess.    Rehab Potential Good   PT Frequency 2x / week   PT Duration 6 weeks   PT Treatment/Interventions Manual techniques;Therapeutic exercise;Moist Heat;Vasopneumatic Device;Taping;Therapeutic activities;Iontophoresis 64m/ml Dexamethasone;Electrical Stimulation;Dry needling;Stair training;Cryotherapy;Patient/family  education;Ultrasound;Neuromuscular re-education   PT Next Visit Plan wean off tape; lunges; squats; etc    Consulted and Agree with Plan of Care Patient      Patient will benefit from skilled therapeutic intervention in order to improve the following deficits and impairments:  Increased edema, Decreased strength, Pain, Impaired flexibility, Increased muscle spasms, Decreased range of motion  Visit Diagnosis: Pain in right thigh  Muscle weakness (generalized)  Stiffness of right knee, not elsewhere classified     Problem List Patient Active Problem List   Diagnosis Date Noted  . Right knee pain 07/12/2015  . Elevated platelet count (HIndex 07/12/2015  . H/O colonoscopy 03/29/2015  . Systemic lupus (HH[place] 03/21/2015  . Sickle cell trait (H[place] 03/21/2015  . History of non anemic vitamin B12 deficiency 03/21/2015  . Chest discomfort 03/21/2015  . Chronic fatigue 03/21/2015    Brode Sculley PNilda SimmerPT, MPH  10/19/2015, 5:06 PM  [facility] [address]Phone: [phone]  Fax:  [phone] Name: Pam GonetMRN: [id number] of Birth: [birth year]

## 2015-10-21 ENCOUNTER — Encounter: Payer: Self-pay | Admitting: Rehabilitative and Restorative Service Providers"

## 2015-10-21 ENCOUNTER — Ambulatory Visit (INDEPENDENT_AMBULATORY_CARE_PROVIDER_SITE_OTHER): Admitting: Rehabilitative and Restorative Service Providers"

## 2015-10-21 DIAGNOSIS — M79651 Pain in right thigh: Secondary | ICD-10-CM

## 2015-10-21 DIAGNOSIS — M6281 Muscle weakness (generalized): Secondary | ICD-10-CM

## 2015-10-21 DIAGNOSIS — M25661 Stiffness of right knee, not elsewhere classified: Secondary | ICD-10-CM | POA: Diagnosis not present

## 2015-10-21 NOTE — Therapy (Signed)
[place], [place], [address] Phone: [phone]   Fax:  [phone]  Physical Therapy Treatment  Patient Details  Name: Pam Gomez MRN: [id number] Date of Birth: 1965/05/29 Referring Provider: Dr Emeterio Reeve  Encounter Date: 10/21/2015      PT End of Session - 10/21/15 1451    Visit Number 14   Number of Visits 19   Date for PT Re-Evaluation 11/02/15   PT Start Time 0626   PT Stop Time 1541   PT Time Calculation (min) 57 min   Activity Tolerance Patient tolerated treatment well      History reviewed. No pertinent past medical history.  Past Surgical History:  Procedure Laterality Date  . CESAREAN SECTION  2002  . QUADRICEPS TENDON REPAIR  07/06/2000    There were no vitals filed for this visit.      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 Knee feels good today - always a little stiffness in the knot area.    Currently in Pain? No/denies   Pain Location Knee   Pain Orientation Right   Pain Descriptors / Indicators Aching   Pain Type Acute pain   Pain Onset More than a month ago   Pain Frequency Intermittent                         [facility] Adult PT Treatment/Exercise - 10/21/15 0001      Knee/Hip Exercises: Stretches   Sports administrator Right;30 seconds;3 reps  noodle under distal thigh to extent hip-pre/post ex x 3 ea     Knee/Hip Exercises: Aerobic   Nustep L5x6     Knee/Hip Exercises: Machines for Strengthening   Cybex Leg Press 3x10 Rt LE only 6 plates, seat on 3     Knee/Hip Exercises: Standing   Forward Lunges 10 reps;2 sets;Both  walking    Lateral Step Up Right;3 sets;10 reps  heel tap    Wall Squat 2 sets;10 reps   SLS SLS Rt with mini squats 10 x 2 sets    Other Standing Knee Exercises 15 reps suma squats   Other Standing  Knee Exercises XTS with pink band FWD step ups,  and side step each side      Ultrasound   Ultrasound Location Rt distal quad    Ultrasound Parameters 1.5 w/cm2; 100%; 58mz; 8 min    Ultrasound Goals Other (Comment)  scar tissue      Manual Therapy   Manual Therapy Soft tissue mobilization   Soft tissue mobilization Rt quad with edge tool to decrease facial restrictions and break up scar tissue                     PT Long Term Goals - 10/19/15 1658      PT LONG TERM GOAL #1   Title I with advanced HEP ( 11/02/15)    Time 6   Period Weeks   Status On-going     PT LONG TERM GOAL #2   Title be able to alternate stairs with good quad control and no pain ( 11/02/15)    Time 8   Period Weeks   Status Partially Met     PT LONG TERM GOAL #3   Title increase Rt hip and knee strength =/> 5-/5 (11/02/15)  Time 6   Period Weeks   Status Partially Met     PT LONG TERM GOAL #4   Title demo Rt prone quad flexibility =/> 125 degrees ( 11/02/15)    Time 6   Period Weeks   Status Partially Met     PT LONG TERM GOAL #5   Title improve FOTO =/< 37% limited ( 11/02/15)    Time 6   Period Weeks   Status On-going               Plan - 10/21/15 1627    Clinical Impression Statement Less palpable tightness through distal quad compared to last visit. Continued tightness through area of scar and deep tissue of distal quad. Progressing well with strengthening program.    Rehab Potential Good   PT Frequency 2x / week   PT Duration 6 weeks   PT Treatment/Interventions Manual techniques;Therapeutic exercise;Moist Heat;Vasopneumatic Device;Taping;Therapeutic activities;Iontophoresis 47m/ml Dexamethasone;Electrical Stimulation;Dry needling;Stair training;Cryotherapy;Patient/family education;Ultrasound;Neuromuscular re-education   PT Next Visit Plan wean off tape; lunges; squats; etc    Consulted and Agree with Plan of Care Patient      Patient will benefit from skilled  therapeutic intervention in order to improve the following deficits and impairments:  Increased edema, Decreased strength, Pain, Impaired flexibility, Increased muscle spasms, Decreased range of motion  Visit Diagnosis: Pain in right thigh  Muscle weakness (generalized)  Stiffness of right knee, not elsewhere classified     Problem List Patient Active Problem List   Diagnosis Date Noted  . Right knee pain 07/12/2015  . Elevated platelet count ([place] 07/12/2015  . H/O colonoscopy 03/29/2015  . Systemic lupus ([place] 03/21/2015  . Sickle cell trait ([place] 03/21/2015  . History of non anemic vitamin B12 deficiency 03/21/2015  . Chest discomfort 03/21/2015  . Chronic fatigue 03/21/2015    Drew Herman PNilda SimmerPT, MPH  10/21/2015, 4:29 PM  [facility] [address]Phone: [phone]  Fax:  [phone] Name: Pam SagunMRN: [id number] of Birth: [birth year]

## 2015-10-24 ENCOUNTER — Ambulatory Visit (INDEPENDENT_AMBULATORY_CARE_PROVIDER_SITE_OTHER): Admitting: Rehabilitative and Restorative Service Providers"

## 2015-10-24 ENCOUNTER — Encounter: Payer: Self-pay | Admitting: Rehabilitative and Restorative Service Providers"

## 2015-10-24 DIAGNOSIS — M79651 Pain in right thigh: Secondary | ICD-10-CM | POA: Diagnosis not present

## 2015-10-24 DIAGNOSIS — M25661 Stiffness of right knee, not elsewhere classified: Secondary | ICD-10-CM | POA: Diagnosis not present

## 2015-10-24 DIAGNOSIS — M6281 Muscle weakness (generalized): Secondary | ICD-10-CM

## 2015-10-24 NOTE — Therapy (Signed)
Pam Gomez, [place], [address] Phone: [phone]   Fax:  [phone]  Physical Therapy Treatment  Patient Details  Name: Pam Gomez MRN: [id number] Date of Birth: 08/29/1965 Referring Provider: Dr Emeterio Reeve  Encounter Date: 10/24/2015      PT End of Session - 10/24/15 1559    Visit Number 15   Number of Visits 19   Date for PT Re-Evaluation 11/02/15   PT Start Time 6962   PT Stop Time 9528   PT Time Calculation (min) 55 min   Activity Tolerance Patient tolerated treatment well      History reviewed. No pertinent past medical history.  Past Surgical History:  Procedure Laterality Date  . CESAREAN SECTION  2002  . QUADRICEPS TENDON REPAIR  07/06/2000    There were no vitals filed for this visit.      Subjective Assessment - 10/24/15 1602    Subjective Doing OK - knee is not painful, just tight.    Currently in Pain? No/denies                         [facility] Adult PT Treatment/Exercise - 10/24/15 0001      Knee/Hip Exercises: Stretches   Quad Stretch Right;30 seconds;3 reps  noodle under distal thigh to extent hip-pre/post ex x 3 ea   Quad Stretch Limitations stretching 2-3 times during ther ex      Knee/Hip Exercises: Aerobic   Nustep L5x6     Knee/Hip Exercises: Machines for Strengthening   Cybex Leg Press 3x10 Rt LE only 6 plates, seat on 3     Knee/Hip Exercises: Standing   Forward Step Up 3 sets;10 reps  ~14 inch step   SLS SLS Rt with mini squats 10 x 3 sets; level surfaces and on BOSU    Gait Training ascending and descending 28 steps x 2 step over step    Other Standing Knee Exercises 15 reps suma squats   Other Standing Knee Exercises XTS with pink band BWD, FWD step ups,  and side step each side      Cryotherapy   Number Minutes Cryotherapy 10 Minutes   Cryotherapy Location Knee  Rt   Type of Cryotherapy Ice pack     Ultrasound   Ultrasound Location Rt distal quad   Ultrasound Parameters 1.5 w/cm2; 100%; i mHz; 8 min    Ultrasound Goals Other (Comment)  scar tissue/tightness      Manual Therapy   Manual Therapy Soft tissue mobilization   Soft tissue mobilization Rt quad with and without edge tool to decrease facial restrictions and break up scar tissue                     PT Long Term Goals - 10/24/15 1604      PT LONG TERM GOAL #1   Title I with advanced HEP ( 11/02/15)    Time 6   Period Weeks   Status On-going     PT LONG TERM GOAL #2   Title be able to alternate stairs with good quad control and no pain ( 11/02/15)    Time 8   Period Weeks   Status Achieved     PT LONG TERM GOAL #3   Title increase Rt hip and knee strength =/> 5-/5 (11/02/15)    Time 6   Period Weeks   Status Partially Met     PT LONG  TERM GOAL #4   Title demo Rt prone quad flexibility =/> 125 degrees ( 11/02/15)    Time 6   Period Weeks   Status Partially Met     PT LONG TERM GOAL #5   Title improve FOTO =/< 37% limited ( 11/02/15)    Time 6   Period Weeks   Status On-going               Plan - 10/24/15 1654    Clinical Impression Statement Continued improvement in functional activity level; increased exercise endurance and decreased palpable tightness Rt LE. Accomplished goal of ascending and descending steps step over step with no pain.    Rehab Potential Good   PT Frequency 2x / week   PT Duration 6 weeks   PT Treatment/Interventions Manual techniques;Therapeutic exercise;Moist Heat;Vasopneumatic Device;Taping;Therapeutic activities;Iontophoresis 55m/ml Dexamethasone;Electrical Stimulation;Dry needling;Stair training;Cryotherapy;Patient/family education;Ultrasound;Neuromuscular re-education   PT Next Visit Plan strengthening; scar tissue mobilization    Consulted and Agree with Plan of Care Patient      Patient will benefit from skilled therapeutic intervention in order to improve the following  deficits and impairments:  Increased edema, Decreased strength, Pain, Impaired flexibility, Increased muscle spasms, Decreased range of motion  Visit Diagnosis: Pain in right thigh  Muscle weakness (generalized)  Stiffness of right knee, not elsewhere classified     Problem List Patient Active Problem List   Diagnosis Date Noted  . Right knee pain 07/12/2015  . Elevated platelet count (HConshohock[place] 07/12/2015  . H/O colonoscopy 03/29/2015  . Systemic lupus (HChrist[place] 03/21/2015  . Sickle cell trait (HIrving[place] 03/21/2015  . History of non anemic vitamin B12 deficiency 03/21/2015  . Chest discomfort 03/21/2015  . Chronic fatigue 03/21/2015    Pam Gomez PNilda SimmerPT, MPH  10/24/2015, 4:57 PM  [facility]1[place] [address]Phone: [phone]  Fax:  [phone] Name: Pam Gomez: [id number] of Birth: 505-Oct-1967

## 2015-10-27 ENCOUNTER — Ambulatory Visit (INDEPENDENT_AMBULATORY_CARE_PROVIDER_SITE_OTHER): Admitting: Physical Therapy

## 2015-10-27 DIAGNOSIS — M25661 Stiffness of right knee, not elsewhere classified: Secondary | ICD-10-CM

## 2015-10-27 DIAGNOSIS — M79651 Pain in right thigh: Secondary | ICD-10-CM | POA: Diagnosis not present

## 2015-10-27 DIAGNOSIS — M6281 Muscle weakness (generalized): Secondary | ICD-10-CM

## 2015-10-27 NOTE — Therapy (Signed)
[place], [place], [address] Phone: [phone]   Fax:  [phone]  Physical Therapy Treatment  Patient Details  Name: Pam Gomez MRN: [id number] Date of Birth: 10/17/1965 Referring Provider: Dr. Emeterio Reeve   Encounter Date: 10/27/2015      PT End of Session - 10/27/15 1620    Visit Number 16   Number of Visits 19   Date for PT Re-Evaluation 11/02/15   PT Start Time 2119   PT Stop Time 1705   PT Time Calculation (min) 48 min   Activity Tolerance Patient tolerated treatment well      No past medical history on file.  Past Surgical History:  Procedure Laterality Date  . CESAREAN SECTION  2002  . QUADRICEPS TENDON REPAIR  07/06/2000    There were no vitals filed for this visit.      Subjective Assessment - 10/27/15 1620    Subjective Pt is mentally/ physically exhausted today.  She continues to have a little difficulty descending stairs (unsure if it is confidence or strength).  Rt knee buckled this morning going down stairs at home.    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    Currently in Pain? No/denies  just tightness            [facility] PT Assessment - 10/27/15 0001      Assessment   Medical Diagnosis Rt knee pain    Referring Provider Dr. Emeterio Reeve    Onset Date/Surgical Date 07/07/14   Hand Dominance Right   Next MD Visit PRN   Prior Therapy ended in Feb 2017     Strength   Right Hip Flexion 5/5   Right Hip Extension --  5-/5   Right Hip ABduction 5/5   Right Knee Flexion --  5-/5     Flexibility   Quadriceps Rt 129 deg            [facility] Adult PT Treatment/Exercise - 10/27/15 0001      Knee/Hip Exercises: Stretches   Sports administrator Right;30 seconds;3 reps  noodle under distal thigh to extent hip-pre/post ex x 3 ea   Quad Stretch Limitations stretching 2-3 times during  ther ex    Gastroc Stretch Both;30 seconds  at wall     Knee/Hip Exercises: Aerobic   Nustep L4:  5 min      Knee/Hip Exercises: Standing   Lateral Step Up 15 reps;Hand Hold: 2;Step Height: 6"   Lateral Step Up Limitations VC to slow speed    SLS Single leg squat to chair x 5 reps (challenging)   Other Standing Knee Exercises 15 reps suma squats with mirror and tactile cues to improve equal wt between feet.      Other Standing Knee Exercises XTS with pink band BWD, FWD  and side step each side      Modalities   Modalities Moist Heat     Moist Heat Therapy   Number Minutes Moist Heat 10 Minutes   Moist Heat Location Knee  post Rt LE     Cryotherapy   Number Minutes Cryotherapy 5 Minutes   Cryotherapy Location Knee  Rt   Type of Cryotherapy Ice massage     Manual Therapy   Soft tissue mobilization Edge tool assistance to Rt distal quad to decrease fascial restrictions           PT Long Term Goals - 10/27/15  [place] #1   Title I with advanced HEP ( 11/02/15)    Time 6   Period Weeks   Status On-going     PT LONG TERM GOAL #2   Title be able to alternate stairs with good quad control and no pain ( 11/02/15)    Time 8   Period Weeks   Status Achieved     PT LONG TERM GOAL #3   Title increase Rt hip and knee strength =/> 5-/5 (11/02/15)    Time 6   Period Weeks   Status Achieved     PT LONG TERM GOAL #4   Title demo Rt prone quad flexibility =/> 125 degrees ( 11/02/15)    Time 6   Period Weeks   Status Achieved     PT LONG TERM GOAL #5   Title improve FOTO =/< 37% limited ( 11/02/15)    Time 6   Period Weeks   Status On-going               Plan - 10/27/15 1703    Clinical Impression Statement Pt demonstrated improved Rt LE strength and improved quad flexibility.  She continues to have fascial tightness and tenderness in Rt lateral distal thigh with functional activities and palpation.   No knee buckling during todays activities,  although she continues to have some at home with stairs.  Pt has met LTG # 3 and 4.    Rehab Potential Good   PT Frequency 2x / week   PT Duration 6 weeks   PT Treatment/Interventions Manual techniques;Therapeutic exercise;Moist Heat;Vasopneumatic Device;Taping;Therapeutic activities;Iontophoresis 42m/ml Dexamethasone;Electrical Stimulation;Dry needling;Stair training;Cryotherapy;Patient/family education;Ultrasound;Neuromuscular re-education   PT Next Visit Plan strengthening; scar tissue mobilization.  FOTO at end of POC (10/4)   Consulted and Agree with Plan of Care Patient      Patient will benefit from skilled therapeutic intervention in order to improve the following deficits and impairments:  Increased edema, Decreased strength, Pain, Impaired flexibility, Increased muscle spasms, Decreased range of motion  Visit Diagnosis: Pain in right thigh  Muscle weakness (generalized)  Stiffness of right knee, not elsewhere classified     Problem List Patient Active Problem List   Diagnosis Date Noted  . Right knee pain 07/12/2015  . Elevated platelet count (HTripp 07/12/2015  . H/O colonoscopy 03/29/2015  . Systemic lupus (HSausal[place] 03/21/2015  . Sickle cell trait (HNul[place] 03/21/2015  . History of non anemic vitamin B12 deficiency 03/21/2015  . Chest discomfort 03/21/2015  . Chronic fatigue 03/21/2015   JKerin Perna PTA 10/27/15 5:09 PM  [place] [address]Phone: [phone]  Fax:  [phone] Name: LTakina BusserMRN: [id number] of Birth: [birth year]

## 2015-11-03 ENCOUNTER — Ambulatory Visit (INDEPENDENT_AMBULATORY_CARE_PROVIDER_SITE_OTHER): Admitting: Physical Therapy

## 2015-11-03 DIAGNOSIS — M79651 Pain in right thigh: Secondary | ICD-10-CM | POA: Diagnosis not present

## 2015-11-03 DIAGNOSIS — M6281 Muscle weakness (generalized): Secondary | ICD-10-CM | POA: Diagnosis not present

## 2015-11-03 DIAGNOSIS — M25661 Stiffness of right knee, not elsewhere classified: Secondary | ICD-10-CM | POA: Diagnosis not present

## 2015-11-03 NOTE — Patient Instructions (Addendum)
Plank    Support body on hands and feet. Keep hips in line with torso and arms straight under chest. Avoid locking elbows. Hold for __20__ secs. Perform 3 sets.  Once 20 secs is easy build up to 30 sec.  Work up to 60 sec.   ADVANCED: Extend one leg up.Half-Kneeling to Standing    Kneeling bring right leg forward, tighten stomach, press through heel and rise to standing. Maintain good posture throughout. Then lower down.  Repeat _10___ times per set. Do __2-3__ sets per session. Do _1___ sessions per day.   Forward [employer]    Lunge forward, fully extend arms straight out, parallel to floor. Do _2-3__ sets _10__ reps. For one set, do reps all one leg then other.  Copyright  VHI. All rights reserved.

## 2015-11-03 NOTE — Therapy (Signed)
[place], [place], [address] Phone: [phone]   Fax:  [phone]  Physical Therapy Treatment  Patient Details  Name: Pam Gomez MRN: [id number] Date of Birth: [birth year] Referring Provider: Dr Emeterio Reeve  Encounter Date: 11/03/2015      PT End of Session - 11/03/15 1533    Visit Number 17   Number of Visits 21   Date for PT Re-Evaluation 12/01/15   PT Start Time 9935   PT Stop Time 1633   PT Time Calculation (min) 60 min   Activity Tolerance Patient tolerated treatment well      No past medical history on file.  Past Surgical History:  Procedure Laterality Date  . CESAREAN SECTION  2002  . QUADRICEPS TENDON REPAIR  07/06/2000    There were no vitals filed for this visit.      Subjective Assessment - 11/03/15 1536    Subjective Pt is trying to use the stairs more and had noticed some difficulty when she does more.     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    Currently in Pain? No/denies  only having twinges in the day, Friday she was in pain with swelling.             [facility] PT Assessment - 11/03/15 0001      Assessment   Medical Diagnosis Rt knee pain    Referring Provider Dr Emeterio Reeve   Onset Date/Surgical Date 07/07/14   Hand Dominance Right   Next MD Visit PRN   Prior Therapy ended in Feb 2017     Observation/Other Assessments   Focus on Therapeutic Outcomes (FOTO)  54% limited     Squat   Comments sligth shift to left     Lunges   Comments difficult with Rt leg FWD and BWD, instability in the knee and hip      ROM / Strength   AROM / PROM / Strength AROM;Strength     Strength   Right Hip Flexion 5/5   Right Hip Extension 5/5   Right Hip ABduction 5/5   Right Knee Flexion 5/5   Right Knee Extension 5/5  quad eccentric fair, - pt with a lot of accommodation      Flexibility   Quadriceps Rt 122     Palpation   Palpation comment improving tightness in the distal  vastus lateralis and VMO Rt                      [facility] Adult PT Treatment/Exercise - 11/03/15 0001      Knee/Hip Exercises: Aerobic   Nustep L4:  6 min  Reviewed current HEP, Prone quad stretches with strap     Knee/Hip Exercises: Standing   Forward Lunges Both;2 sets;10 reps   Other Standing Knee Exercises high knee to stand with Rt LE     Modalities   Modalities Cryotherapy     Moist Heat Therapy   Number Minutes Moist Heat 12 Minutes   Moist Heat Location Knee                PT Education - 11/03/15 1603    Education provided Yes   Education Details HEP   Person(s) Educated Patient   Methods Demonstration;Explanation   Comprehension Verbalized understanding;Returned demonstration             PT Long Term Goals - 11/03/15  [place] #1   Title I with advanced HEP ( 12/01/15)    Time 4   Period Weeks   Status On-going     PT LONG TERM GOAL #2   Title be able to alternate stairs with good quad control and no pain ( 11/02/15)    Status Achieved     PT LONG TERM GOAL #3   Title increase Rt hip and knee strength =/> 5-/5 (11/02/15)    Status Achieved     PT LONG TERM GOAL #4   Title demo Rt prone quad flexibility =/> 125 degrees ( 11/02/15)    Status Achieved     PT LONG TERM GOAL #5   Title improve FOTO =/< 37% limited ( 12/01/15)    Time 4   Period Weeks   Status On-going  scored 54% limited     PT LONG TERM GOAL #6   Title tolerates ellipitical with no more than 1/10 pain for 30-40' 9 ( 12/01/15)   Time 4   Period Weeks   Status New     PT LONG TERM GOAL #7   Title tolerate high knee to stand transfers with minimal to no pain ( 12/01/15)    Time 4   Period Weeks   Status New     PT LONG TERM GOAL #8   Title pass her PT test, 2.5 miles in the allowable time, 35 minutes ( 12/01/15)    Time 4   Period Weeks    Status New               Plan - 11/03/15 1608    Clinical Impression Statement Pam Gomez is making progress, she has met almost all of her original goals.  She continues to have difficulty with higher level tasks that stress her Rt quad.  She would benefit from more therapy to push higher level acitvity and progess to some plyometrics.  Will decrease frequency.    Rehab Potential Good   PT Frequency 1x / week   PT Duration 4 weeks   PT Treatment/Interventions Manual techniques;Therapeutic exercise;Moist Heat;Vasopneumatic Device;Taping;Therapeutic activities;Iontophoresis 2m/ml Dexamethasone;Electrical Stimulation;Dry needling;Stair training;Cryotherapy;Patient/family education;Ultrasound;Neuromuscular re-education   PT Next Visit Plan higher level strengthening and slowly add in plyometrics.    Consulted and Agree with Plan of Care Patient      Patient will benefit from skilled therapeutic intervention in order to improve the following deficits and impairments:  Increased edema, Decreased strength, Pain, Impaired flexibility, Increased muscle spasms, Decreased range of motion  Visit Diagnosis: Stiffness of right knee, not elsewhere classified - Plan: PT plan of care cert/re-cert  Muscle weakness (generalized) - Plan: PT plan of care cert/re-cert  Pain in right thigh - Plan: PT plan of care cert/re-cert     Problem List Patient Active Problem List   Diagnosis Date Noted  . Right knee pain 07/12/2015  . Elevated platelet count (HNessen [place] 07/12/2015  . H/O colonoscopy 03/29/2015  . Systemic lupus ([place] 03/21/2015  . Sickle cell trait (HLarch[place] 03/21/2015  . History of non anemic vitamin B12 deficiency 03/21/2015  . Chest discomfort 03/21/2015  . Chronic fatigue 03/21/2015    SBoneta LucksrPT  11/03/2015, 4:24 PM  [facility] [address]Phone: [phone]  Fax:  [phone] Name: Pam WarthMRN: [id number] of Birth: 507-25-1967

## 2015-11-16 ENCOUNTER — Encounter: Admitting: Rehabilitative and Restorative Service Providers"

## 2015-11-17 ENCOUNTER — Ambulatory Visit (INDEPENDENT_AMBULATORY_CARE_PROVIDER_SITE_OTHER): Admitting: Physical Therapy

## 2015-11-17 DIAGNOSIS — M25661 Stiffness of right knee, not elsewhere classified: Secondary | ICD-10-CM

## 2015-11-17 DIAGNOSIS — M6281 Muscle weakness (generalized): Secondary | ICD-10-CM | POA: Diagnosis not present

## 2015-11-17 DIAGNOSIS — M79651 Pain in right thigh: Secondary | ICD-10-CM

## 2015-11-17 NOTE — Therapy (Signed)
[place], [place], [address] Phone: [phone]   Fax:  [phone]  Physical Therapy Treatment  Patient Details  Name: Pam Gomez MRN: [id number] Date of Birth: 1965-05-10 Referring Provider: Dr. Sheppard Coil   Encounter Date: 11/17/2015      PT End of Session - 11/17/15 0900    Visit Number 18   Number of Visits 21   Date for PT Re-Evaluation 12/01/15   PT Start Time 0856   PT Stop Time 0951   PT Time Calculation (min) 55 min   Activity Tolerance Patient tolerated treatment well;No increased pain      No past medical history on file.  Past Surgical History:  Procedure Laterality Date  . CESAREAN SECTION  2002  . QUADRICEPS TENDON REPAIR  07/06/2000    There were no vitals filed for this visit.      Subjective Assessment - 11/17/15 0901    Subjective Pt reports she has been working on the endurance part of PFT.  She is walking about 2 miles now.  She is up to 25-30 min on eliptical, no pain during but 4/10 afterward.  She resolves pain with self massage and stretches.    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    Currently in Pain? No/denies  tightness             [facility] PT Assessment - 11/17/15 0001      Assessment   Medical Diagnosis Rt knee pain    Referring Provider Dr. Sheppard Coil    Onset Date/Surgical Date 07/07/14   Hand Dominance Right   Next MD Visit PRN          [facility] Adult PT Treatment/Exercise - 11/17/15 0001      Knee/Hip Exercises: Stretches   Passive Hamstring Stretch Right;Left;2 reps;30 seconds   Quad Stretch Right;30 seconds;3 reps  noodle under distal thigh to extent hip-pre/post ex x 3 ea   Gastroc Stretch Both;30 seconds     Knee/Hip Exercises: Aerobic   Recumbent Bike L3 x 5 min      Knee/Hip Exercises: Plyometrics   Bilateral Jumping --  3 reps.  Switched to  [employer].      Knee/Hip Exercises: Standing   SLS single leg forward leans to chair seat x 10 reps each side, VC and demo for improved form.     Other Standing Knee Exercises high kneeling to stand with Rt LE x 10 reps with Light UE support    Other Standing Knee Exercises mini jumps on trampoline to simulate beginning plyometrics - improved form with repetition. (pt reported improved confindence)      Modalities   Modalities Cryotherapy     Moist Heat Therapy   Number Minutes Moist Heat --   Moist Heat Location --     Cryotherapy   Number Minutes Cryotherapy 15 Minutes   Cryotherapy Location Knee   Type of Cryotherapy Ice pack     Manual Therapy   Soft tissue mobilization Edge tool assistance to Rt distal quad to decrease fascial restrictions    Kinesiotex Facilitate Muscle;Edema  Dynamic Tape: for lateral tracking patella on Rt.        Kinesiotix   Edema 2- I strips on medial  Lt knee to decompress tissue and decrease pain.                       PT  Long Term Goals - 11/17/15 1015      PT LONG TERM GOAL #1   Title I with advanced HEP ( 12/01/15)    Time 4   Period Weeks   Status On-going     PT LONG TERM GOAL #2   Title be able to alternate stairs with good quad control and no pain ( 11/02/15)    Time 8   Period Weeks   Status Achieved     PT LONG TERM GOAL #3   Title increase Rt hip and knee strength =/> 5-/5 (11/02/15)    Time 6   Period Weeks   Status Achieved     PT LONG TERM GOAL #4   Title demo Rt prone quad flexibility =/> 125 degrees ( 11/02/15)    Time 6   Period Weeks   Status Achieved     PT LONG TERM GOAL #5   Title improve FOTO =/< 37% limited ( 12/01/15)    Time 4   Period Weeks   Status On-going     PT LONG TERM GOAL #6   Title tolerates ellipitical with no more than 1/10 pain for 30-40' ( 12/01/15)   Time 4   Period Weeks   Status On-going     PT LONG TERM GOAL #7   Title tolerate high knee to stand transfers with  minimal to no pain ( 12/01/15)    Time 4   Period Weeks   Status On-going     PT LONG TERM GOAL #8   Title pass her PT test, 2.5 miles in the allowable time, 35 minutes ( 12/01/15)    Time 4   Period Weeks   Status On-going               Plan - 11/17/15 1255    Clinical Impression Statement Pt was challenged with high kneel to standing split squats and light bounces on mini trampoline.  She tolerated all exercises without increase in pain.  She continues to make gradual gains towards remaining goals. She is completing PFT before next scheduled visit.    Rehab Potential Good   PT Frequency 1x / week   PT Duration 4 weeks   PT Treatment/Interventions Manual techniques;Therapeutic exercise;Moist Heat;Vasopneumatic Device;Taping;Therapeutic activities;Iontophoresis 4mg /ml Dexamethasone;Electrical Stimulation;Dry needling;Stair training;Cryotherapy;Patient/family education;Ultrasound;Neuromuscular re-education   PT Next Visit Plan higher level strengthening and slowly add in plyometrics.    Consulted and Agree with Plan of Care Patient      Patient will benefit from skilled therapeutic intervention in order to improve the following deficits and impairments:  Increased edema, Decreased strength, Pain, Impaired flexibility, Increased muscle spasms, Decreased range of motion  Visit Diagnosis: Stiffness of right knee, not elsewhere classified  Muscle weakness (generalized)  Pain in right thigh     Problem List Patient Active Problem List   Diagnosis Date Noted  . Right knee pain 07/12/2015  . Elevated platelet count ([place]) 07/12/2015  . H/O colonoscopy 03/29/2015  . Systemic lupus ([place]) 03/21/2015  . Sickle cell trait ([place]) 03/21/2015  . History of non anemic vitamin B12 deficiency 03/21/2015  . Chest discomfort 03/21/2015  . Chronic fatigue 03/21/2015   Kerin Perna, PTA 11/17/15 1:01 PM  [facility] [place], [place], [address] Phone: [phone]   Fax:  [phone]  Name: Pam Gomez MRN: [id number] Date of Birth: 29-Apr-1965

## 2015-11-24 ENCOUNTER — Ambulatory Visit (INDEPENDENT_AMBULATORY_CARE_PROVIDER_SITE_OTHER): Admitting: Physical Therapy

## 2015-11-24 DIAGNOSIS — M6281 Muscle weakness (generalized): Secondary | ICD-10-CM | POA: Diagnosis not present

## 2015-11-24 DIAGNOSIS — M25661 Stiffness of right knee, not elsewhere classified: Secondary | ICD-10-CM | POA: Diagnosis not present

## 2015-11-24 DIAGNOSIS — M79651 Pain in right thigh: Secondary | ICD-10-CM

## 2015-11-24 NOTE — Therapy (Signed)
[place], [place], [address] Phone: [phone]   Fax:  [phone]  Physical Therapy Treatment  Patient Details  Name: Pam Gomez MRN: [id number] Date of Birth: 11-May-1965 Referring Provider: Dr. Sheppard Coil   Encounter Date: 11/24/2015      PT End of Session - 11/24/15 1716    Visit Number 19   Number of Visits 21   Date for PT Re-Evaluation 12/01/15   PT Start Time 1714  pt in late   PT Stop Time 1801   PT Time Calculation (min) 47 min   Activity Tolerance Patient tolerated treatment well      No past medical history on file.  Past Surgical History:  Procedure Laterality Date  . CESAREAN SECTION  2002  . QUADRICEPS TENDON REPAIR  07/06/2000    There were no vitals filed for this visit.      Subjective Assessment - 11/24/15 1716    Subjective Pam Gomez reports that her PFT test went really well, she did well in all areas. There were some descrepancy with her group, so she will have to do this again. Scheduled for 12/17/15            [facility] PT Assessment - 11/24/15 0001      Assessment   Medical Diagnosis Rt knee pain                      [facility] Adult PT Treatment/Exercise - 11/24/15 0001      Knee/Hip Exercises: Stretches   Active Hamstring Stretch Both;1 rep;30 seconds   Quad Stretch Both;1 rep;30 seconds  prone with strap     Knee/Hip Exercises: Aerobic   Nustep L5x6'     Knee/Hip Exercises: Plyometrics   Bilateral Jumping --  on rebounder, bouncing, then with air   Bilateral Jumping Limitations jogging on rebounder      Knee/Hip Exercises: Standing   Forward Lunges 2 sets;Both;5 reps  lunges with hops   Functional Squat 2 sets;5 sets  squat hops, sumo and regular   Other Standing Knee Exercises 10 reps single leg sit to/ from stand with 5# wt.      Modalities   Modalities Cryotherapy     Cryotherapy   Number Minutes Cryotherapy 12 Minutes   Cryotherapy Location Knee  bilat    Type of Cryotherapy Ice pack     Manual Therapy   Manual therapy comments dynamic tape to Rt knee for lateral tracking     Kinesiotix   Edema 2- I strips on medial  Lt knee to decompress tissue and decrease pain.                       PT Long Term Goals - 11/24/15 1756      PT LONG TERM GOAL #8   Title pass her PT test, 2.5 miles in the allowable time, 35 minutes ( 12/01/15)    Status Partially met, completed in 36 ' ( allowable is [place] - 11/24/15 1755    Clinical Impression Statement Pam Gomez continues to show improvement, she has high level weakness and will still have some intermittent knee pain. She does present with increased edema in her knees today.  Most likely due to having her PFT test over the weekend and having to push herself.    Rehab Potential Good   PT Frequency  1x / week   PT Duration 4 weeks   PT Treatment/Interventions Manual techniques;Therapeutic exercise;Moist Heat;Vasopneumatic Device;Taping;Therapeutic activities;Iontophoresis 74m/ml Dexamethasone;Electrical Stimulation;Dry needling;Stair training;Cryotherapy;Patient/family education;Ultrasound;Neuromuscular re-education   PT Next Visit Plan progress plyometric    Consulted and Agree with Plan of Care Patient      Patient will benefit from skilled therapeutic intervention in order to improve the following deficits and impairments:  Increased edema, Decreased strength, Pain, Impaired flexibility, Increased muscle spasms, Decreased range of motion  Visit Diagnosis: Stiffness of right knee, not elsewhere classified  Muscle weakness (generalized)  Pain in right thigh     Problem List Patient Active Problem List   Diagnosis Date Noted  . Right knee pain 07/12/2015  . Elevated platelet count ([place] 07/12/2015  . H/O colonoscopy 03/29/2015  . Systemic lupus ([place] 03/21/2015  . Sickle cell trait ([place] 03/21/2015  . History of non  anemic vitamin B12 deficiency 03/21/2015  . Chest discomfort 03/21/2015  . Chronic fatigue 03/21/2015    SJeral PinchPT  11/24/2015, 5:57 PM  [facility] [address]Phone: [phone]  Fax:  [phone] Name: Pam AlfredoMRN: [id number] of Birth: [birth year]

## 2015-12-01 ENCOUNTER — Ambulatory Visit (INDEPENDENT_AMBULATORY_CARE_PROVIDER_SITE_OTHER): Admitting: Physical Therapy

## 2015-12-01 DIAGNOSIS — M79651 Pain in right thigh: Secondary | ICD-10-CM

## 2015-12-01 DIAGNOSIS — M25661 Stiffness of right knee, not elsewhere classified: Secondary | ICD-10-CM | POA: Diagnosis not present

## 2015-12-01 DIAGNOSIS — M6281 Muscle weakness (generalized): Secondary | ICD-10-CM | POA: Diagnosis not present

## 2015-12-01 NOTE — Therapy (Signed)
[place], [place], [address] Phone: [phone]   Fax:  [phone]  Physical Therapy Treatment  Patient Details  Name: Pam Gomez MRN: [id number] Date of Birth: Jun 28, 1965 Referring Provider: Dr. Sheppard Coil   Encounter Date: 12/01/2015      PT End of Session - 12/01/15 1759    Visit Number 20   Number of Visits 24   Date for PT Re-Evaluation 12/29/15   PT Start Time 1703   PT Stop Time [id number]   PT Time Calculation (min) 43 min   Activity Tolerance Patient tolerated treatment well;No increased pain   Behavior During Therapy [facility] for tasks assessed/performed      No past medical history on file.  Past Surgical History:  Procedure Laterality Date  . CESAREAN SECTION  2002  . QUADRICEPS TENDON REPAIR  07/06/2000    There were no vitals filed for this visit.      Subjective Assessment - 12/01/15 1707    Subjective Reports that she has her PFT test in 2 weeks and she needs to be ready for it. Had a busy day and feeling tired. Knee has been a little stiff, but has been getting better.    Currently in Pain? Yes   Pain Score 1    Pain Location Knee   Pain Orientation Right   Pain Descriptors / Indicators Tightness   Aggravating Factors  going up/down stairs   Pain Relieving Factors ice             [facility] PT Assessment - 12/01/15 0001      Observation/Other Assessments   Focus on Therapeutic Outcomes (FOTO)  41% limited                     [facility] Adult PT Treatment/Exercise - 12/01/15 0001      Exercises   Other Exercises  quick feet forward/back; Lt/Rt each direction 2X30 sec.     Knee/Hip Exercises: Stretches   Active Hamstring Stretch Both;30 seconds;3 reps   Quad Stretch Both;1 rep;30 seconds  prone with strap     Knee/Hip Exercises: Aerobic   Nustep L5x6'     Knee/Hip Exercises: Plyometrics   Bilateral Jumping Limitations pt declined     Knee/Hip  Exercises: Standing   Forward Lunges Both;1 set;10 reps   Forward Lunges Limitations static   Wall Squat 1 set;10 reps   Other Standing Knee Exercises highknee to stand 1X10 no UE assist     Modalities   Modalities Cryotherapy     Cryotherapy   Number Minutes Cryotherapy 10 Minutes   Cryotherapy Location Knee   Type of Cryotherapy Ice pack     Manual Therapy   Manual therapy comments dynamic tape to Rt knee for lateral tracking                PT Education - 12/01/15 1758    Education provided Yes   Education Details reinforcing HEP   Person(s) Educated Patient   Methods Explanation   Comprehension Verbalized understanding             PT Long Term Goals - 12/01/15 1721      PT LONG TERM GOAL #1   Title I with advanced HEP ( 12/01/15)    Period Weeks   Status On-going     PT LONG TERM GOAL #2   Title be able to alternate stairs with good quad control and no pain ( 11/02/15)  Status Achieved     PT LONG TERM GOAL #3   Title increase Rt hip and knee strength =/> 5-/5 (11/02/15)    Status Achieved     PT LONG TERM GOAL #4   Title demo Rt prone quad flexibility =/> 125 degrees ( 11/02/15)    Status Achieved     PT LONG TERM GOAL #5   Title improve FOTO =/< 37% limited ( 12/01/15)    Time 4   Period Weeks   Status On-going     PT LONG TERM GOAL #6   Title tolerates ellipitical with no more than 1/10 pain for 30-40' ( 12/01/15)   Period Weeks   Status Achieved     PT LONG TERM GOAL #7   Title tolerate high knee to stand transfers with minimal to no pain ( 12/01/15)    Status Achieved     PT LONG TERM GOAL #8   Title pass her PT test, 2.5 miles in the allowable time, 35 minutes ( 12/01/15)    Time 4   Status On-going               Plan - 12/01/15 1801    Clinical Impression Statement Pt is continuing to make gains with her function and activity tolerance. FOTO score is continuing to improve with score of 41% (54% 11/03/15). Additionally the pt  has yet to pass her PFT test and will be retesting in 2 weeks.  Anticipating continuation of PT services to focus on advancing her strength and functional stability.    Rehab Potential Good   PT Frequency 1x / week   PT Duration 4 weeks   PT Next Visit Plan increase dynamic activities   Consulted and Agree with Plan of Care Patient      Patient will benefit from skilled therapeutic intervention in order to improve the following deficits and impairments:  Increased edema, Decreased strength, Pain, Impaired flexibility, Increased muscle spasms, Decreased range of motion  Visit Diagnosis: Stiffness of right knee, not elsewhere classified - Plan: PT plan of care cert/re-cert  Pain in right thigh - Plan: PT plan of care cert/re-cert  Muscle weakness (generalized) - Plan: PT plan of care cert/re-cert     Problem List Patient Active Problem List   Diagnosis Date Noted  . Right knee pain 07/12/2015  . Elevated platelet count ([place]) 07/12/2015  . H/O colonoscopy 03/29/2015  . Systemic lupus ([place]) 03/21/2015  . Sickle cell trait ([place]) 03/21/2015  . History of non anemic vitamin B12 deficiency 03/21/2015  . Chest discomfort 03/21/2015  . Chronic fatigue 03/21/2015    Linard Millers, PT, CSCS 12/01/2015, 6:17 PM  [facility] [place], [place], [address] Phone: [phone]   Fax:  [phone]  Name: Pam Gomez MRN: [id number] Date of Birth: [birth year]

## 2015-12-08 ENCOUNTER — Encounter: Admitting: Physical Therapy

## 2015-12-15 ENCOUNTER — Ambulatory Visit (INDEPENDENT_AMBULATORY_CARE_PROVIDER_SITE_OTHER): Admitting: Physical Therapy

## 2015-12-15 DIAGNOSIS — M6281 Muscle weakness (generalized): Secondary | ICD-10-CM | POA: Diagnosis not present

## 2015-12-15 DIAGNOSIS — M25661 Stiffness of right knee, not elsewhere classified: Secondary | ICD-10-CM | POA: Diagnosis not present

## 2015-12-15 DIAGNOSIS — M79651 Pain in right thigh: Secondary | ICD-10-CM

## 2015-12-15 NOTE — Therapy (Signed)
[place], [place], [address] Phone: [phone]   Fax:  [phone]  Physical Therapy Treatment  Patient Details  Name: Pam Gomez MRN: [id number] Date of Birth: 1965-12-24 Referring Provider: Dr. Sheppard Coil   Encounter Date: 12/15/2015      PT End of Session - 12/15/15 1541    Visit Number 21   Number of Visits 24   Date for PT Re-Evaluation 12/29/15   PT Start Time [id number]   PT Stop Time [id number]   PT Time Calculation (min) 49 min   Activity Tolerance Patient tolerated treatment well;No increased pain      No past medical history on file.  Past Surgical History:  Procedure Laterality Date  . CESAREAN SECTION  2002  . QUADRICEPS TENDON REPAIR  07/06/2000    There were no vitals filed for this visit.      Subjective Assessment - 12/15/15 1541    Subjective Pt reports she has felt a returned tightness and cramping in Rt quad. she feels discouraged.  She has been walking and exercising less.    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    Currently in Pain? No/denies  just stiffness             [facility] PT Assessment - 12/15/15 0001      Flexibility   Quadriceps Rt 120          [facility] Adult PT Treatment/Exercise - 12/15/15 0001      Knee/Hip Exercises: Stretches   Active Hamstring Stretch Both;30 seconds;3 reps   Quad Stretch Right;Left;3 reps;60 seconds  pool noodle under thigh     Knee/Hip Exercises: Aerobic   Nustep L5x6'     Knee/Hip Exercises: Standing   Wall Squat 1 set;10 reps   Lunge Walking - Round Trips 25 ft, challenging - VC / tactile cues for improved form.    Other Standing Knee Exercises Fast feet (front /back) x 30 sec x 3 sets; side to side @ fast speed x 30 sec x 2 reps      Modalities   Modalities Gomez Heat     Gomez Heat Therapy   Number Minutes Gomez Heat 10 Minutes   Gomez Heat  Location Knee  Right     Manual Therapy   Manual Therapy Myofascial release   Myofascial Release to Rt lateral/midbelly quad             PT Long Term Goals - 12/15/15 1552      PT LONG TERM GOAL #1   Title I with advanced HEP ( 12/01/15)    Time 4   Period Weeks   Status On-going     PT LONG TERM GOAL #2   Title be able to alternate stairs with good quad control and no pain ( 11/02/15)    Time 8   Period Weeks   Status Achieved     PT LONG TERM GOAL #3   Title increase Rt hip and knee strength =/> 5-/5 (11/02/15)      PT LONG TERM GOAL #4   Title demo Rt prone quad flexibility =/> 125 degrees ( 11/02/15)    Time 6   Period Weeks   Status On-going     PT LONG TERM GOAL #5   Title improve FOTO =/< 37% limited ( 12/01/15)    Time 4   Period Weeks   Status On-going  PT LONG TERM GOAL #6   Title tolerates ellipitical with no more than 1/10 pain for 30-40' ( 12/01/15)   Time 4   Period Weeks   Status Achieved     PT LONG TERM GOAL #7   Title tolerate high knee to stand transfers with minimal to no pain ( 12/01/15)    Time 4   Period Weeks   Status Achieved     PT LONG TERM GOAL #8   Title pass her PT test, 2.5 miles in the allowable time, 35 minutes ( 12/01/15)    Time 4   Period Weeks   Status On-going               Plan - 12/15/15 1611    Clinical Impression Statement Pt reported her Rt knee felt less stiff after ther ex.  Pt has yet to pass her PFT and anticipates retesting in December.  Encouraged pt to return to walking program/ HEP to assist with meeting established goals.    Rehab Potential Good   PT Frequency 1x / week   PT Duration 4 weeks   PT Treatment/Interventions Manual techniques;Therapeutic exercise;Gomez Heat;Vasopneumatic Device;Taping;Therapeutic activities;Iontophoresis 4mg /ml Dexamethasone;Electrical Stimulation;Dry needling;Stair training;Cryotherapy;Patient/family education;Ultrasound;Neuromuscular re-education   PT Next Visit  Plan increase dynamic activities   Consulted and Agree with Plan of Care Patient      Patient will benefit from skilled therapeutic intervention in order to improve the following deficits and impairments:  Increased edema, Decreased strength, Pain, Impaired flexibility, Increased muscle spasms, Decreased range of motion  Visit Diagnosis: Stiffness of right knee, not elsewhere classified  Pain in right thigh  Muscle weakness (generalized)     Problem List Patient Active Problem List   Diagnosis Date Noted  . Right knee pain 07/12/2015  . Elevated platelet count ([place]) 07/12/2015  . H/O colonoscopy 03/29/2015  . Systemic lupus ([place]) 03/21/2015  . Sickle cell trait ([place]) 03/21/2015  . History of non anemic vitamin B12 deficiency 03/21/2015  . Chest discomfort 03/21/2015  . Chronic fatigue 03/21/2015   Kerin Perna, PTA 12/15/15 5:30 PM  [facility] [place], [place], [address] Phone: [phone]   Fax:  [phone]  Name: Pam Gomez MRN: [id number] Date of Birth: 11/08/1965

## 2015-12-20 ENCOUNTER — Encounter: Admitting: Rehabilitative and Restorative Service Providers"

## 2015-12-26 ENCOUNTER — Encounter: Payer: Self-pay | Admitting: Rehabilitative and Restorative Service Providers"

## 2015-12-26 ENCOUNTER — Ambulatory Visit (INDEPENDENT_AMBULATORY_CARE_PROVIDER_SITE_OTHER): Admitting: Rehabilitative and Restorative Service Providers"

## 2015-12-26 DIAGNOSIS — M6281 Muscle weakness (generalized): Secondary | ICD-10-CM | POA: Diagnosis not present

## 2015-12-26 DIAGNOSIS — M25661 Stiffness of right knee, not elsewhere classified: Secondary | ICD-10-CM

## 2015-12-26 DIAGNOSIS — M79651 Pain in right thigh: Secondary | ICD-10-CM

## 2015-12-26 NOTE — Therapy (Signed)
[facility] [place], [place], [address] Phone: [phone]   Fax:  [phone]  Physical Therapy Treatment  Patient Details  Name: Pam Gomez MRN: [id number] Date of Birth: 07-Aug-1965 Referring Provider: Dr Emeterio Reeve  Encounter Date: 12/26/2015      PT End of Session - 12/26/15 1111    Visit Number 22   Number of Visits 24   Date for PT Re-Evaluation 12/29/15   PT Start Time 1108   PT Stop Time 1159   PT Time Calculation (min) 51 min   Activity Tolerance Patient tolerated treatment well      History reviewed. No pertinent past medical history.  Past Surgical History:  Procedure Laterality Date  . CESAREAN SECTION  2002  . QUADRICEPS TENDON REPAIR  07/06/2000    There were no vitals filed for this visit.      Subjective Assessment - 12/26/15 1112    Subjective Patient reports that she is feeling tired - no energy. Her knee has had some pain and stiffness. "Sharp/dull pain" today.    Currently in Pain? Yes   Pain Score 4    Pain Location Knee   Pain Orientation Right   Pain Descriptors / Indicators Tightness   Pain Type Acute pain   Pain Onset More than a month ago   Pain Frequency Intermittent            [facility] PT Assessment - 12/26/15 0001      Assessment   Medical Diagnosis Rt knee pain    Referring Provider Dr Emeterio Reeve   Onset Date/Surgical Date 07/07/14   Hand Dominance Right   Next MD Visit PRN   Prior Therapy ended in Feb 2017     AROM   Right Knee Flexion 122                     [facility] Adult PT Treatment/Exercise - 12/26/15 0001      Knee/Hip Exercises: Stretches   Active Hamstring Stretch Both;30 seconds;3 reps   Quad Stretch Right;Left;3 reps;60 seconds  pool noodle under thigh     Knee/Hip Exercises: Aerobic   Elliptical L3x4'     Knee/Hip Exercises: Standing   Forward Step Up 2 sets;10 reps  12 inch double step on stairs slow  eccentric lowering    Wall Squat 1 set;10 reps  20 sec hold    Lunge Walking - Round Trips 60 ft,      Modalities   Modalities Moist Heat     Moist Heat Therapy   Number Minutes Moist Heat 15 Minutes   Moist Heat Location Knee  Right                     PT Long Term Goals - 12/26/15 1313      PT LONG TERM GOAL #1   Title I with advanced HEP ( 12/01/15)    Time 4   Period Weeks   Status On-going     PT LONG TERM GOAL #2   Title be able to alternate stairs with good quad control and no pain ( 11/02/15)    Time 8   Period Weeks   Status Achieved     PT LONG TERM GOAL #3   Title increase Rt hip and knee strength =/> 5-/5 (11/02/15)    Time 6   Period Weeks   Status Achieved     PT LONG TERM GOAL #4  Title demo Rt prone quad flexibility =/> 125 degrees ( 11/02/15)    Time 6   Period Weeks   Status On-going     PT LONG TERM GOAL #5   Title improve FOTO =/< 37% limited ( 12/01/15)    Time 4   Period Weeks   Status On-going     PT LONG TERM GOAL #6   Title tolerates ellipitical with no more than 1/10 pain for 30-40' ( 12/01/15)   Time 4   Period Weeks   Status Achieved     PT LONG TERM GOAL #7   Title tolerate high knee to stand transfers with minimal to no pain ( 12/01/15)    Time 4   Period Weeks   Status Achieved     PT LONG TERM GOAL #8   Title pass her PT test, 2.5 miles in the allowable time, 35 minutes ( 12/01/15)    Time 4   Period Weeks   Status On-going               Plan - 12/26/15 1312    Clinical Impression Statement Continued feeling of stiffness - more so than weakness. Has started having some pain in the Lt knee. Has not been in the gym due to holiday travel and festivities. Encouraged independent work on HEP/gym program. Good ROM following stretching.    Rehab Potential Good   PT Frequency 1x / week   PT Duration 4 weeks   PT Treatment/Interventions Manual techniques;Therapeutic exercise;Moist Heat;Vasopneumatic  Device;Taping;Therapeutic activities;Iontophoresis 4mg /ml Dexamethasone;Electrical Stimulation;Dry needling;Stair training;Cryotherapy;Patient/family education;Ultrasound;Neuromuscular re-education   PT Next Visit Plan increase dynamic activities   Consulted and Agree with Plan of Care Patient      Patient will benefit from skilled therapeutic intervention in order to improve the following deficits and impairments:  Increased edema, Decreased strength, Pain, Impaired flexibility, Increased muscle spasms, Decreased range of motion  Visit Diagnosis: Stiffness of right knee, not elsewhere classified  Pain in right thigh  Muscle weakness (generalized)     Problem List Patient Active Problem List   Diagnosis Date Noted  . Right knee pain 07/12/2015  . Elevated platelet count ([place]) 07/12/2015  . H/O colonoscopy 03/29/2015  . Systemic lupus ([place]) 03/21/2015  . Sickle cell trait ([place]) 03/21/2015  . History of non anemic vitamin B12 deficiency 03/21/2015  . Chest discomfort 03/21/2015  . Chronic fatigue 03/21/2015    Alexarae Oliva Nilda Simmer PT, MPH  12/26/2015, 1:16 PM  [facility] [place], [place], [address] Phone: [phone]   Fax:  [phone]  Name: Kayleeann Koprowski MRN: [id number] Date of Birth: 05-Aug-1965

## 2015-12-29 ENCOUNTER — Ambulatory Visit (INDEPENDENT_AMBULATORY_CARE_PROVIDER_SITE_OTHER): Admitting: Physical Therapy

## 2015-12-29 ENCOUNTER — Encounter: Payer: Self-pay | Admitting: Physical Therapy

## 2015-12-29 DIAGNOSIS — M6281 Muscle weakness (generalized): Secondary | ICD-10-CM

## 2015-12-29 DIAGNOSIS — M25661 Stiffness of right knee, not elsewhere classified: Secondary | ICD-10-CM

## 2015-12-29 DIAGNOSIS — M79651 Pain in right thigh: Secondary | ICD-10-CM

## 2015-12-29 NOTE — Therapy (Signed)
[place], [place], [address] Phone: [phone]   Fax:  [phone]  Physical Therapy Treatment  Patient Details  Name: Pam Gomez MRN: [id number] Date of Birth: Jun 26, 1965 Referring Provider: Dr Emeterio Reeve  Encounter Date: 12/29/2015      PT End of Session - 12/29/15 1632    Visit Number 23   Date for PT Re-Evaluation 12/29/15   PT Start Time 1622   PT Stop Time 1701   PT Time Calculation (min) 39 min   Activity Tolerance Patient limited by pain  due to recent fall      History reviewed. No pertinent past medical history.  Past Surgical History:  Procedure Laterality Date  . CESAREAN SECTION  2002  . QUADRICEPS TENDON REPAIR  07/06/2000    There were no vitals filed for this visit.      Subjective Assessment - 12/29/15 1624    Subjective Pt was at work and was reaching for her stapler on her desk and a piece fell off, she reached down to get the piece and she fell out of her chair and she landed hard on the ground while in a semi twisted position landing on the Rt knee. Swelling present, using ice and heat and stretches.     Currently in Pain? Yes   Pain Location Knee   Pain Orientation Right;Lateral   Pain Descriptors / Indicators Sharp   Pain Type Acute pain   Pain Frequency Constant   Aggravating Factors  falling on her knee   Pain Relieving Factors ice             [facility] PT Assessment - 12/29/15 0001      Assessment   Medical Diagnosis Rt knee pain      Observation/Other Assessments   Focus on Therapeutic Outcomes (FOTO)  61% limited due to recent fall out of her chair     AROM   Right Knee Flexion 124   Left Knee Extension 0     Strength   Right Hip Flexion 5/5   Right Hip Extension 5/5   Right Hip ABduction 5/5   Right/Left Knee --  bilat WNL   Right Knee Flexion --  fair (+) eccentric Rt quad - having some pain due to recent      Flexibility    Quadriceps prone knee flex Rt 130, Lt 116                     [facility] Adult PT Treatment/Exercise - 12/29/15 0001      Knee/Hip Exercises: Standing   Other Standing Knee Exercises heel taps off step, increased pain due to recent fall.      Knee/Hip Exercises: Supine   Quad Sets Strengthening;Right;10 reps  10 sec     Modalities   Modalities Passenger transport manager Location Rt knee   Building services engineer Parameters to tolerance   Electrical Stimulation Goals Edema     Vasopneumatic   Number Minutes Vasopneumatic  15 minutes   Vasopnuematic Location  Knee   Vasopneumatic Pressure Low   Vasopneumatic Temperature  3*     Manual Therapy   Myofascial Release good patellar mobility.                      PT Long Term Goals - 12/29/15 1634      PT  LONG TERM GOAL #1   Title I with advanced HEP ( 12/01/15)    Status Achieved     PT LONG TERM GOAL #2   Title be able to alternate stairs with good quad control and no pain ( 11/02/15)    Status Achieved     PT LONG TERM GOAL #3   Title increase Rt hip and knee strength =/> 5-/5 (11/02/15)    Status Achieved     PT LONG TERM GOAL #4   Title demo Rt prone quad flexibility =/> 125 degrees ( 11/02/15)    Status Achieved     PT LONG TERM GOAL #5   Title improve FOTO =/< 37% limited ( 12/01/15)    Status Not Met  not met due to fall and having pain. 61% limited today.      PT LONG TERM GOAL #6   Title tolerates ellipitical with no more than 1/10 pain for 30-40' ( 12/01/15)   Status Achieved     PT LONG TERM GOAL #7   Title tolerate high knee to stand transfers with minimal to no pain ( 12/01/15)    Status Achieved     PT LONG TERM GOAL #8   Title pass her PT test, 2.5 miles in the allowable time, 35 minutes ( 12/01/15)    Status Not Met  not scheduled to perform until 01/14/16                Plan - 12/29/15 1655    Clinical Impression Statement Ivalene has done very well with therapy, her Rt knee ROM and flexibility continue to improve, her strength is excellent and she has met almost all of her goals.  She did have a slight set back this week when she fell out of her chair and developed some swelling and pain in the knee.  Even with this her ROM and strength are good.  Recommend discharge to HEP and the gym. The knee should settle down from this acute injury.    PT Next Visit Plan discharge.       Patient will benefit from skilled therapeutic intervention in order to improve the following deficits and impairments:     Visit Diagnosis: Stiffness of right knee, not elsewhere classified  Pain in right thigh  Muscle weakness (generalized)     Problem List Patient Active Problem List   Diagnosis Date Noted  . Right knee pain 07/12/2015  . Elevated platelet count ([place]) 07/12/2015  . H/O colonoscopy 03/29/2015  . Systemic lupus ([place]) 03/21/2015  . Sickle cell trait ([place]) 03/21/2015  . History of non anemic vitamin B12 deficiency 03/21/2015  . Chest discomfort 03/21/2015  . Chronic fatigue 03/21/2015    Jeral Pinch PT  12/29/2015, 4:57 PM  [facility] [place], [place], [address] Phone: [phone]   Fax:  [phone]  Name: Briannia Laba MRN: [id number] Date of Birth: [birth year]  PHYSICAL THERAPY DISCHARGE SUMMARY  Visits from Start of Care: 23  Current functional level related to goals / functional outcomes: See above   Remaining deficits: See above, some acute pain after falling out of her chair this week, motion and strength great.    Education / Equipment: HEP Plan: Patient agrees to discharge.  Patient goals were partially met. Patient is being discharged due to                                                     ?????  maximizing functional potential.      Jeral Pinch, PT 12/29/15 4:59 PM

## 2015-12-29 NOTE — Patient Instructions (Addendum)

## 2016-07-11 ENCOUNTER — Encounter: Payer: Self-pay | Admitting: Osteopathic Medicine

## 2016-07-11 ENCOUNTER — Telehealth: Payer: Self-pay | Admitting: Osteopathic Medicine

## 2016-07-11 ENCOUNTER — Ambulatory Visit (INDEPENDENT_AMBULATORY_CARE_PROVIDER_SITE_OTHER): Admitting: Osteopathic Medicine

## 2016-07-11 VITALS — BP 116/74 | HR 86 | Ht 65.0 in | Wt 190.0 lb

## 2016-07-11 DIAGNOSIS — L600 Ingrowing nail: Secondary | ICD-10-CM

## 2016-07-11 DIAGNOSIS — G8929 Other chronic pain: Secondary | ICD-10-CM | POA: Diagnosis not present

## 2016-07-11 DIAGNOSIS — M329 Systemic lupus erythematosus, unspecified: Secondary | ICD-10-CM

## 2016-07-11 DIAGNOSIS — Z0000 Encounter for general adult medical examination without abnormal findings: Secondary | ICD-10-CM | POA: Diagnosis not present

## 2016-07-11 DIAGNOSIS — M25561 Pain in right knee: Secondary | ICD-10-CM | POA: Diagnosis not present

## 2016-07-11 DIAGNOSIS — D573 Sickle-cell trait: Secondary | ICD-10-CM

## 2016-07-11 LAB — COMPLETE METABOLIC PANEL WITH GFR
ALBUMIN: 4.1 g/dL (ref 3.6–5.1)
ALK PHOS: 60 U/L (ref 33–130)
ALT: 16 U/L (ref 6–29)
AST: 17 U/L (ref 10–35)
BUN: 11 mg/dL (ref 7–25)
CALCIUM: 9.3 mg/dL (ref 8.6–10.4)
CHLORIDE: 107 mmol/L (ref 98–110)
CO2: 25 mmol/L (ref 20–31)
Creat: 0.98 mg/dL (ref 0.50–1.05)
GFR, EST NON AFRICAN AMERICAN: 67 mL/min (ref >=60)
GFR, Est African American: 77 mL/min (ref >=60)
GLUCOSE: 97 mg/dL (ref 65–99)
POTASSIUM: 4.2 mmol/L (ref 3.5–5.3)
SODIUM: 141 mmol/L (ref 135–146)
Total Bilirubin: 0.4 mg/dL (ref 0.2–1.2)
Total Protein: 7.4 g/dL (ref 6.1–8.1)

## 2016-07-11 LAB — CBC
HCT: 36.3 % (ref 35.0–45.0)
Hemoglobin: 11.9 g/dL (ref 11.7–15.5)
MCH: 27.7 pg (ref 27.0–33.0)
MCHC: 32.8 g/dL (ref 32.0–36.0)
MCV: 84.6 fL (ref 80.0–100.0)
MPV: 9.5 fL (ref 7.5–12.5)
Platelets: 396 10*3/uL (ref 140–400)
RBC: 4.29 MIL/uL (ref 3.80–5.10)
RDW: 14.1 % (ref 11.0–15.0)
WBC: 4.8 10*3/uL (ref 3.8–10.8)

## 2016-07-11 LAB — LIPID PANEL
CHOL/HDL RATIO: 4.5 ratio (ref <5.0)
CHOLESTEROL: 205 mg/dL — AB (ref <200)
HDL: 46 mg/dL — AB (ref >50)
LDL Cholesterol: 140 mg/dL — ABNORMAL HIGH (ref <100)
Triglycerides: 94 mg/dL (ref <150)
VLDL: 19 mg/dL (ref <30)

## 2016-07-11 LAB — TSH: TSH: 1.44 mIU/L

## 2016-07-11 NOTE — Telephone Encounter (Signed)
This needs to go to the providers instead of me. I do not make that decision. Rhonda Cunningham,CMA

## 2016-07-11 NOTE — Telephone Encounter (Signed)
Pt.notified

## 2016-07-11 NOTE — Patient Instructions (Signed)
Plan: As long as labs are looking ok and you are feeling well, plan to follow up as directed by your specialists, and with me annually for wellness checks, sooner if needed.

## 2016-07-11 NOTE — Telephone Encounter (Signed)
Pam Gomez wants to switch PCP TO [facility], let her know of the decision as soon as you make one. Have PT contacted by PHONE, BUT NOTIFY SOMEONE TO CONTACT HER B/C SHE WANTS TO MAKE A FUTURE APPT

## 2016-07-11 NOTE — Telephone Encounter (Signed)
I am ok with switch as I do not know patient ;however, I do think we need to get a reason for switch. Also patient needs to be aware that I will be out of office for 8 weeks coming soon and I do not think now is a great time to switch if she has any urgent needs.

## 2016-07-11 NOTE — Telephone Encounter (Signed)
If it's okay with Luvenia Starch, it is okay with me. Please be sure to switch the PCP in the chart and it sounds like the patient is expecting a follow-up phone call - Amber can you take care of this please? Thanks.

## 2016-07-11 NOTE — Progress Notes (Signed)
HPI: Pam Gomez is a 51 y.o. female who presents to [facility] today for chief complaint of:  Chief Complaint  Patient presents with  . Annual Exam    Annual exam - see below for preventive care review  Postmenopausal - vaginal dryness Has been an issue for her, OB/GYN had previously had her on hormones but this wasn't particularly helpful, not taking these at the moment.  Knee - history of quadriceps tendon repair, occasionally feels some laxity in the knee, would like me to take a look at this today.  Ingrown toenail - would like to see about referral for removal  History of lupus and sickle cell trait - not currently on any medications, previously on Plaquenil, would like to know if I can see inflammatory markers from previous specialty follow-up.   Past medical, social and family history reviewed: No past medical history on file. Past Surgical History:  Procedure Laterality Date  . CESAREAN SECTION  2002  . QUADRICEPS TENDON REPAIR  07/06/2000   Social History  Substance Use Topics  . Smoking status: Never Smoker  . Smokeless tobacco: Never Used  . Alcohol use No   Family History  Problem Relation Age of Onset  . Diabetes Other   . Hypertension Father   . Depression Paternal Uncle   . Hypertension Paternal Uncle   . Diabetes Paternal Grandmother   . Hypertension Paternal Grandmother   . Stroke Paternal Grandmother   . Alcohol abuse Paternal Grandfather   . Hypertension Paternal Grandfather   . Heart attack Paternal Aunt     Current Outpatient Prescriptions  Medication Sig Dispense Refill  . cycloSPORINE (RESTASIS) 0.05 % ophthalmic emulsion 1 drop 2 (two) times daily.     No current facility-administered medications for this visit.    Allergies  Allergen Reactions  . Iodinated Diagnostic Agents Nausea And Vomiting  . Penicillins Hives      Review of Systems: CONSTITUTIONAL:  No  fever, no chills, No recent illness,  No unintentional weight changes HEAD/EYES/EARS/NOSE/THROAT: No  headache, no vision change, CARDIAC: No  chest pain, No  pressure RESPIRATORY: No  cough, No  shortness of breath GASTROINTESTINAL: No  nausea, No  vomiting, No  abdominal pain, MUSCULOSKELETAL: (+) myalgia/arthralgia R knee SKIN: No  rash/wounds/concerning lesions NEUROLOGIC: No  weakness, No  dizziness, No  slurred speech PSYCHIATRIC: No  concerns with depression, No  concerns with anxiety, No sleep problems  Exam:  BP 116/74   Pulse 86   Ht 5\' 5"  (1.651 m)   Wt 190 lb (86.2 kg)   LMP 11/08/2011   BMI 31.62 kg/m  Constitutional: VS see above. General Appearance: alert, well-developed, well-nourished, NAD Eyes: Normal lids and conjunctive, non-icteric sclera Ears, Nose, Mouth, Throat: MMM, Normal external inspection ears/nares/mouth/lips/gums,  Neck: No masses, trachea midline. No thyroid enlargement. No tenderness/mass appreciated. No lymphadenopathy Respiratory: Normal respiratory effort. no wheeze, no rhonchi, no rales Cardiovascular: S1/S2 normal, no murmur, no rub/gallop auscultated. RRR.  Gastrointestinal: Nontender, no masses. No hepatomegaly, no splenomegaly. No hernia appreciated. Bowel sounds normal. Rectal exam deferred.  Musculoskeletal: Gait normal. No clubbing/cyanosis of digits. (+) crepitus on passive motion of R knee, ant/post drawer negative, neg varus/valgus stress, neg McMurrays and Apley's Neurological: No cranial nerve deficit on limited exam. Motor and sensation intact and symmetric Skin: warm, dry, intact. No rash/ulcer. No concerning nevi or subq nodules on limited exam.   Psychiatric: Normal judgment/insight. Normal mood and affect. Oriented x3.  ASSESSMENT/PLAN:  Annual physical exam - Plan: CBC, COMPLETE METABOLIC PANEL WITH GFR, Lipid panel, TSH, VITAMIN D 25 Hydroxy (Vit-D Deficiency, Fractures), Vitamin B12, MM Digital Screening  Ingrown toenail without infection - Plan: Ambulatory  referral to Podiatry  Systemic lupus erythematosus, unspecified SLE type, unspecified organ involvement status ([place])  Chronic pain of right knee  Sickle cell trait ([place])   All questions were answered. Visit summary with medication list and pertinent instructions was printed for patient to review. ER/RTC precautions were reviewed with the patient. Return in about 1 year (around 07/11/2017) for [place], [place] if needed.   FEMALE PREVENTIVE CARE  ANNUAL SCREENING/COUNSELING Tobacco - noNever  Alcohol - social drinker Diet/Exercise - HEALTHY HABITS DISCUSSED TO DECREASE CV RISK Depression - PQH2 Negative Domestic violence concerns - no HTN SCREENING - SEE VITALS Vaccination status - SEE BELOW  SEXUAL HEALTH Sexually active in the past year - yes With  - Yes with female. STI - The patient denies history of sexually transmitted disease. STI testing today? - no  INFECTIOUS DISEASE SCREENING HIV - all adults 15-65 - does not need GC/CT - sexually active - does not need HepC - DOB [birth year] - does not need TB - does not need  DISEASE SCREENING Lipid - need DM2 - need Osteoporosis - does not need  CANCER SCREENING  Cervical - does not need Breast - MAMMO - needs Lung - does not need Colon - does not need  ADULT VACCINATION Influenza - already has Td - already has HPV - was not indicated Zoster - was not indicated Pneumonia - was not indicated  OTHER Fall - exercise and Vit D age 24+ - does not need Consider ASA - age 26-59 - does not need

## 2016-07-12 LAB — VITAMIN D 25 HYDROXY (VIT D DEFICIENCY, FRACTURES): Vit D, 25-Hydroxy: 23 ng/mL — ABNORMAL LOW (ref 30–100)

## 2016-07-12 LAB — VITAMIN B12: Vitamin B-12: 351 pg/mL (ref 200–1100)

## 2016-07-16 ENCOUNTER — Ambulatory Visit: Payer: Self-pay | Admitting: Podiatry

## 2016-07-18 ENCOUNTER — Emergency Department (INDEPENDENT_AMBULATORY_CARE_PROVIDER_SITE_OTHER)
Admission: EM | Admit: 2016-07-18 | Discharge: 2016-07-18 | Disposition: A | Discharge status: Home / Self Care | Source: Home / Self Care | Attending: Family Medicine | Admitting: Family Medicine

## 2016-07-18 DIAGNOSIS — B029 Zoster without complications: Secondary | ICD-10-CM

## 2016-07-18 MED ORDER — LIDOCAINE 5 % EX OINT: 1.0000 "application " | TOPICAL_OINTMENT | Freq: Four times a day (QID) | CUTANEOUS | 0 refills | Status: DC | PRN

## 2016-07-18 MED ORDER — VALACYCLOVIR HCL 1 G PO TABS: 1000.0000 mg | ORAL_TABLET | Freq: Three times a day (TID) | ORAL | 0 refills | Status: DC

## 2016-07-18 MED ORDER — TRAMADOL HCL 50 MG PO TABS: 50.0000 mg | ORAL_TABLET | Freq: Four times a day (QID) | ORAL | 0 refills | Status: DC | PRN

## 2016-07-18 NOTE — ED Provider Notes (Signed)
CSN: [id number]     Arrival date & time 07/18/16  1737 History   First MD Initiated Contact with Patient 07/18/16 1752     Chief Complaint  Patient presents with  . Rash   (Consider location/radiation/quality/duration/timing/severity/associated sxs/prior Treatment) HPI Pam Gomez is a 51 y.o. female presenting to UC with c/o tingling and muscle spasm type pain in Right mid back that started this past weekend but over the last 24 hours she noticed a rash appearing in the same area as the pain.  Pain is mild at this time but was sharp and cramping this past weekend.  Pt concerned it may be shingles. She did have chicken pox growing up but she has never had shingles.  Denies fever, chills, n/v/d. Denies recent illness but has been under a lot of stress at home and work.  Pt notes she is leaving for the weekend for work and will be doing a lot of traveling [place] the [place].  She will only be gone for the weekend though.    History reviewed. No pertinent past medical history. Past Surgical History:  Procedure Laterality Date  . CESAREAN SECTION  2002  . QUADRICEPS TENDON REPAIR  07/06/2000   Family History  Problem Relation Age of Onset  . Diabetes Other   . Hypertension Father   . Depression Paternal Uncle   . Hypertension Paternal Uncle   . Diabetes Paternal Grandmother   . Hypertension Paternal Grandmother   . Stroke Paternal Grandmother   . Alcohol abuse Paternal Grandfather   . Hypertension Paternal Grandfather   . Heart attack Paternal Aunt    Social History  Substance Use Topics  . Smoking status: Never Smoker  . Smokeless tobacco: Never Used  . Alcohol use No   OB History    No data available     Review of Systems  Constitutional: Negative for chills and fever.  Gastrointestinal: Negative for abdominal pain, diarrhea, nausea and vomiting.  Musculoskeletal: Positive for back pain and myalgias. Negative for arthralgias.  Skin: Positive for color change and  rash. Negative for wound.  Neurological: Negative for weakness and numbness.    Allergies  Iodinated diagnostic agents and Penicillins  Home Medications   Prior to Admission medications   Medication Sig Start Date End Date Taking? Authorizing Provider  cycloSPORINE (RESTASIS) 0.05 % ophthalmic emulsion 1 drop 2 (two) times daily.    [provider]  lidocaine (XYLOCAINE) 5 % ointment Apply 1 application topically 4 (four) times daily as needed. To rash 07/18/16   Noe Gens, PA-C  traMADol (ULTRAM) 50 MG tablet Take 1 tablet (50 mg total) by mouth every 6 (six) hours as needed. 07/18/16   Noe Gens, PA-C  valACYclovir (VALTREX) 1000 MG tablet Take 1 tablet (1,000 mg total) by mouth 3 (three) times daily. 07/18/16   Noe Gens, PA-C   Meds Ordered and Administered this Visit  Medications - No data to display  BP (!) 151/84 (BP Location: Left Arm)   Pulse 90   Temp 98.2 F (36.8 C) (Oral)   Ht 5' 3.5" (1.613 m)   Wt 195 lb (88.5 kg)   LMP 11/08/2011   SpO2 95%   BMI 34.00 kg/m  No data found.   Physical Exam  Constitutional: She is oriented to person, place, and time. She appears well-developed and well-nourished. No distress.  HENT:  Head: Normocephalic and atraumatic.  Mouth/Throat: Oropharynx is clear and moist.  Eyes: EOM are normal.  Neck:  Normal range of motion.  Cardiovascular: Normal rate and regular rhythm.   Pulmonary/Chest: Effort normal. No respiratory distress.  Musculoskeletal: Normal range of motion. She exhibits tenderness. She exhibits no edema.       Back:  Tenderness to Right mid back muscles. Three small erythematous papules. Mildly tender.  Neurological: She is alert and oriented to person, place, and time.  Skin: Skin is warm and dry. Rash noted. She is not diaphoretic.  Psychiatric: She has a normal mood and affect. Her behavior is normal.  Nursing note and vitals reviewed.   Urgent Care Course     Procedures (including  critical care time)  Labs Review Labs Reviewed - No data to display  Imaging Review No results found.   MDM   1. Herpes zoster without complication    Hx and exam c/w herpes zoster, expect additional rash to develop over the next few days. Pt is going out of town for work Will start Valtrex  Rx: Valtrex, tramadol and lidocaine cream  Home care instructions provided. F/u with PCP in 1 week if not improving.     Noe Gens, [place] 07/18/16 1947

## 2016-07-18 NOTE — Discharge Instructions (Signed)
°  Tramadol is strong pain medication. While taking, do not drink alcohol, drive, or perform any other activities that requires focus while taking these medications.  ° °

## 2016-07-18 NOTE — ED Triage Notes (Signed)
Pt stated that she had tingling and muscle spasms on the right side of her back this weekend.  Broke out in a rash in the last 24 hours.

## 2016-07-24 ENCOUNTER — Ambulatory Visit (INDEPENDENT_AMBULATORY_CARE_PROVIDER_SITE_OTHER)

## 2016-07-24 DIAGNOSIS — Z1231 Encounter for screening mammogram for malignant neoplasm of breast: Secondary | ICD-10-CM

## 2016-07-25 ENCOUNTER — Encounter: Payer: Self-pay | Admitting: *Deleted

## 2016-07-25 ENCOUNTER — Emergency Department (INDEPENDENT_AMBULATORY_CARE_PROVIDER_SITE_OTHER)
Admission: EM | Admit: 2016-07-25 | Discharge: 2016-07-25 | Disposition: A | Discharge status: Home / Self Care | Source: Home / Self Care | Attending: Family Medicine | Admitting: Family Medicine

## 2016-07-25 DIAGNOSIS — B029 Zoster without complications: Secondary | ICD-10-CM | POA: Diagnosis not present

## 2016-07-25 MED ORDER — LIDOCAINE 5 % EX OINT: 1.0000 "application " | TOPICAL_OINTMENT | Freq: Four times a day (QID) | CUTANEOUS | 0 refills | Status: DC | PRN

## 2016-07-25 NOTE — ED Provider Notes (Signed)
CSN: [id number]     Arrival date & time 07/25/16  1736 History   First MD Initiated Contact with Patient 07/25/16 1754     Chief Complaint  Patient presents with  . Rash   (Consider location/radiation/quality/duration/timing/severity/associated sxs/prior Treatment) HPI  Pam Gomez is a 51 y.o. female presenting to UC for recheck of shingles rash on Right side of chest and mid back that she was seen for at [facility] on 07/18/16.  She states she still has some stinging pain but it is controlled with acetaminophen and ibuprofen.  Denies fever or chills. No new rashes. She is requesting a work note stating she is not contagious.   Pt has f/u with PCP tomorrow at [facility].  History reviewed. No pertinent past medical history. Past Surgical History:  Procedure Laterality Date  . CESAREAN SECTION  2002  . QUADRICEPS TENDON REPAIR  07/06/2000   Family History  Problem Relation Age of Onset  . Diabetes Other   . Hypertension Father   . Depression Paternal Uncle   . Hypertension Paternal Uncle   . Diabetes Paternal Grandmother   . Hypertension Paternal Grandmother   . Stroke Paternal Grandmother   . Alcohol abuse Paternal Grandfather   . Hypertension Paternal Grandfather   . Heart attack Paternal Aunt    Social History  Substance Use Topics  . Smoking status: Never Smoker  . Smokeless tobacco: Never Used  . Alcohol use No   OB History    No data available     Review of Systems  Constitutional: Negative for chills and fever.  Musculoskeletal: Positive for back pain. Negative for myalgias.  Skin: Positive for rash.    Allergies  Iodinated diagnostic agents and Penicillins  Home Medications   Prior to Admission medications   Medication Sig Start Date End Date Taking? Authorizing Provider  cycloSPORINE (RESTASIS) 0.05 % ophthalmic emulsion 1 drop 2 (two) times daily.    [provider]  lidocaine (XYLOCAINE) 5 % ointment Apply 1 application topically 4 (four) times daily as  needed. To rash 07/25/16   Noe Gens, PA-C  traMADol (ULTRAM) 50 MG tablet Take 1 tablet (50 mg total) by mouth every 6 (six) hours as needed. 07/18/16   Noe Gens, PA-C  valACYclovir (VALTREX) 1000 MG tablet Take 1 tablet (1,000 mg total) by mouth 3 (three) times daily. 07/18/16   Noe Gens, PA-C   Meds Ordered and Administered this Visit  Medications - No data to display  BP 134/81 (BP Location: Left Arm)   Pulse 74   Temp 98.2 F (36.8 C) (Oral)   Resp 16   Ht 5' 3.25" (1.607 m)   Wt 194 lb (88 kg)   LMP 11/08/2011   SpO2 100%   BMI 34.09 kg/m  No data found.   Physical Exam  Constitutional: She is oriented to person, place, and time. She appears well-developed and well-nourished.  HENT:  Head: Normocephalic and atraumatic.  Eyes: EOM are normal.  Neck: Normal range of motion.  Cardiovascular: Normal rate.   Pulmonary/Chest: Effort normal.  Musculoskeletal: Normal range of motion. She exhibits tenderness. She exhibits no edema.       Back:  Mild tenderness to Right side mid back   Neurological: She is alert and oriented to person, place, and time.  Skin: Skin is warm and dry. Rash noted.  Right side thoracic region: faint erythema with pin-point scabbed lesions, 3 close to spine and 4 under Right breast. No bleeding or discharge.  Psychiatric: She has a normal mood and affect. Her behavior is normal.  Nursing note and vitals reviewed.   Urgent Care Course     Procedures (including critical care time)  Labs Review Labs Reviewed - No data to display  Imaging Review Mm Digital Screening  Result Date: 07/25/2016 CLINICAL DATA:  Screening. EXAM: DIGITAL SCREENING BILATERAL MAMMOGRAM WITH CAD COMPARISON:  Previous exam(s). ACR Breast Density Category c: The breast tissue is heterogeneously dense, which may obscure small masses. FINDINGS: There are no findings suspicious for malignancy. Images were processed with CAD. IMPRESSION: No mammographic evidence of  malignancy. A result letter of this screening mammogram will be mailed directly to the patient. RECOMMENDATION: Screening mammogram in one year. (Code:SM-B-01Y) BI-RADS CATEGORY  1: Negative. Electronically Signed   By: Lovey Newcomer M.D.   On: 07/25/2016 09:07     MDM   1. Herpes zoster without complication    Rash healed well, nearly resolved.  Pt was given work note last week on 07/18/16 that she was clear to return to work on 07/19/16 and light duty for 5 days.  Note today provided stating she is not considered contagious. Encouraged to f/u with Dr. Madilyn Fireman tomorrow as previously scheduled. Refill of Lidocaine cream provided.    Noe Gens, [place] 07/25/16 1851

## 2016-07-25 NOTE — ED Triage Notes (Signed)
Pam Gomez is here today for a follow up of her rash.

## 2016-07-26 ENCOUNTER — Encounter: Payer: Self-pay | Admitting: Family Medicine

## 2016-07-26 ENCOUNTER — Ambulatory Visit (INDEPENDENT_AMBULATORY_CARE_PROVIDER_SITE_OTHER): Admitting: Family Medicine

## 2016-07-26 VITALS — BP 121/77 | HR 63 | Wt 194.0 lb

## 2016-07-26 DIAGNOSIS — B029 Zoster without complications: Secondary | ICD-10-CM

## 2016-07-26 NOTE — Progress Notes (Addendum)
   Subjective:    Patient ID: Pam Gomez, female    DOB: 02-28-1965, 51 y.o.   MRN: [id number]  HPI 51 yo female is here today for Follow-up on shingles. She was originally seen on June 20 and started on valacyclovir and given lidocaine gel and given tramadol. She did go back to work the following day with light duty. She comes in today because she still having some discomfort and pain but overall is getting better. She was not able to tolerate the tramadol because it was too sedating for her work. She said she needs to be is mentally clear as possible while at work. He still has a few more days on the Valtrex. She ran out of Tylenol and actually took Excedrin Migraine today at work and says it actually did provide some mild relief for her. Now today it is almost feels like a muscle spasm in her back.   Review of Systems     Objective:   Physical Exam  Constitutional: She is oriented to person, place, and time. She appears well-developed and well-nourished.  HENT:  Head: Normocephalic and atraumatic.  Eyes: Conjunctivae and EOM are normal.  Cardiovascular: Normal rate.   Pulmonary/Chest: Effort normal.  Neurological: She is alert and oriented to person, place, and time.  Skin: Skin is dry. Rash noted. No pallor.  Dry papules scattered along the right side of body/flank. No vesicles.   Psychiatric: She has a normal mood and affect. Her behavior is normal.  Vitals reviewed.         Assessment & Plan:  Herpes zoster-make sure to complete a course of Valtrex. Continue to use the lidocaine gel as needed. Recommended anti-inflammatories and she wants to wait medications that are sedating. Recommend 2 Aleve twice a day. Can add muscle relaxer if needed but would be potentially sedating as well. She can still use the tramadol in the evenings when she is home. If pain persists for more than 2 weeks and call us back.

## 2016-07-26 NOTE — Patient Instructions (Signed)
If pain persists for more than 2 weeks and call us back.

## 2016-08-02 NOTE — Progress Notes (Signed)
Subjective:    Patient ID: Pam Gomez, female    DOB: 1965-07-26, 51 y.o.   MRN: [id number]  HPI Follow-up shingles-she feels like the rash itself is actually clearing up. She does not have any new blisters but unfortunately she is still having the pain and itching.  She would also like to discuss menopausal symptoms today. She is 51 And has been postmenopausal for about 3 years. She is really struggling with weight gain, food cravings, dealing with stress at work. And vaginal dryness.she would like to consider trial of phentermine.    Review of Systems  BP 125/70   Pulse 78   Temp 98.3 F (36.8 C) (Oral)   Wt 193 lb (87.5 kg)   LMP 11/08/2011   BMI 33.92 kg/m     Allergies  Allergen Reactions  . Iodinated Diagnostic Agents Nausea And Vomiting  . Penicillins Hives    No past medical history on file.  Past Surgical History:  Procedure Laterality Date  . CESAREAN SECTION  2002  . QUADRICEPS TENDON REPAIR  07/06/2000    Social History   Social History  . Marital status: Married    Spouse name: N/A  . Number of children: N/A  . Years of education: N/A   Occupational History  . Not on file.   Social History Main Topics  . Smoking status: Never Smoker  . Smokeless tobacco: Never Used  . Alcohol use No  . Drug use: No  . Sexual activity: Not on file   Other Topics Concern  . Not on file   Social History Narrative  . No narrative on file    Family History  Problem Relation Age of Onset  . Diabetes Other   . Hypertension Father   . Depression Paternal Uncle   . Hypertension Paternal Uncle   . Diabetes Paternal Grandmother   . Hypertension Paternal Grandmother   . Stroke Paternal Grandmother   . Alcohol abuse Paternal Grandfather   . Hypertension Paternal Grandfather   . Heart attack Paternal Aunt     Outpatient Encounter Prescriptions as of 08/03/2016  Medication Sig  . cycloSPORINE (RESTASIS) 0.05 % ophthalmic emulsion 1 drop 2 (two) times daily.   Marland Kitchen lidocaine (XYLOCAINE) 5 % ointment Apply 1 application topically 4 (four) times daily as needed. To rash  . traMADol (ULTRAM) 50 MG tablet Take 1 tablet (50 mg total) by mouth every 6 (six) hours as needed.  . gabapentin (NEURONTIN) 100 MG capsule 1 tab po QHS x 2 days, then increase to BID x 2 days, then increase to TID.  Marland Kitchen phentermine 15 MG capsule Take 1 capsule (15 mg total) by mouth every morning.  . [DISCONTINUED] valACYclovir (VALTREX) 1000 MG tablet Take 1 tablet (1,000 mg total) by mouth 3 (three) times daily. (Patient not taking: Reported on 08/03/2016)   No facility-administered encounter medications on file as of 08/03/2016.          Objective:   Physical Exam  Constitutional: She is oriented to person, place, and time. She appears well-developed and well-nourished.  HENT:  Head: Normocephalic and atraumatic.  Cardiovascular: Normal rate, regular rhythm and normal heart sounds.   Pulmonary/Chest: Effort normal and breath sounds normal.  Neurological: She is alert and oriented to person, place, and time.  Skin: Skin is warm and dry.  Psychiatric: She has a normal mood and affect. Her behavior is normal.       Assessment & Plan:  Postherpetic neuralgia-we'll start gabapentin. Discussed how this  medication works and discussed potential for sedation etc. Follow-up in one month.  Normal weight gain/BMI 34-discussed options. Discussed pros and cons of medication management. Also discussed nonmedication management including regular exercise, tracking calories. Recommended a smart phone application called my fitness pal. Also recommended increasing protein and eating low amounts of carbohydrates to help burn fat. We also discussed the possibility of a bariatric referral. She is most interested in phentermine. Discussed the potential side effects of the medications and pros and cons. She is to stop the medication immediately if she expresses any chest pain or shortness of breath or  palpitations. Otherwise I will see her back in 4 weeks.

## 2016-08-03 ENCOUNTER — Ambulatory Visit (INDEPENDENT_AMBULATORY_CARE_PROVIDER_SITE_OTHER): Admitting: Family Medicine

## 2016-08-03 VITALS — BP 125/70 | HR 78 | Temp 98.3°F | Wt 193.0 lb

## 2016-08-03 DIAGNOSIS — R635 Abnormal weight gain: Secondary | ICD-10-CM

## 2016-08-03 DIAGNOSIS — Z6834 Body mass index (BMI) 34.0-34.9, adult: Secondary | ICD-10-CM | POA: Diagnosis not present

## 2016-08-03 DIAGNOSIS — B0229 Other postherpetic nervous system involvement: Secondary | ICD-10-CM | POA: Diagnosis not present

## 2016-08-03 MED ORDER — PHENTERMINE HCL 15 MG PO CAPS: 15.0000 mg | ORAL_CAPSULE | ORAL | 0 refills | Status: DC

## 2016-08-03 MED ORDER — GABAPENTIN 100 MG PO CAPS: ORAL_CAPSULE | ORAL | 3 refills | Status: DC

## 2016-08-03 NOTE — Patient Instructions (Addendum)
Recommend My Fitness Pal to track calories. Can try premier protein shake if you would like to try this. Follow-up in one month for blood pressure and weight check.

## 2016-08-06 ENCOUNTER — Ambulatory Visit (INDEPENDENT_AMBULATORY_CARE_PROVIDER_SITE_OTHER): Admitting: Podiatry

## 2016-08-06 ENCOUNTER — Encounter: Payer: Self-pay | Admitting: Podiatry

## 2016-08-06 VITALS — BP 121/74 | HR 78 | Ht 63.0 in | Wt 188.0 lb

## 2016-08-06 DIAGNOSIS — M21969 Unspecified acquired deformity of unspecified lower leg: Secondary | ICD-10-CM | POA: Diagnosis not present

## 2016-08-06 DIAGNOSIS — L600 Ingrowing nail: Secondary | ICD-10-CM | POA: Diagnosis not present

## 2016-08-06 DIAGNOSIS — M216X2 Other acquired deformities of left foot: Secondary | ICD-10-CM

## 2016-08-06 DIAGNOSIS — M2042 Other hammer toe(s) (acquired), left foot: Secondary | ICD-10-CM

## 2016-08-06 DIAGNOSIS — M216X1 Other acquired deformities of right foot: Secondary | ICD-10-CM

## 2016-08-06 NOTE — Progress Notes (Signed)
SUBJECTIVE: 51 y.o. year old female presents complaining of painful nail. Patient points lateral border of left great toe that hurts off and on.  REVIEW OF SYSTEMS: A comprehensive review of systems was negative except for: Systemic Lupus, right knee surgery for injured Quadricep muscle right, recent episode of Shingles.  OBJECTIVE: DERMATOLOGIC EXAMINATION: Ingrown nail with thick cuticle at proximal base left great toe lateral border.  VASCULAR EXAMINATION OF LOWER LIMBS: Pedal pulses: All pedal pulses are palpable with normal pulsation.  Capillary Filling times within 3 seconds in all digits.  No edema or erythema noted. Temperature gradient from tibial crest to dorsum of foot is within normal bilateral.  NEUROLOGIC EXAMINATION OF THE LOWER LIMBS: Achilles DTR is present and within normal. Monofilament (Semmes-Weinstein 10-gm) sensory testing positive 6 out of 6, bilateral. Vibratory sensations(128Hz  turning fork) intact at medial and lateral forefoot bilateral.  Sharp and Dull discriminatory sensations at the plantar ball of hallux is intact bilateral.   MUSCULOSKELETAL EXAMINATION: Tight Achilles tendon L>R. Hypermobile first ray bilateral with mild bunion bilateral. Contracted 2nd toe left.  ASSESSMENT: Painful ingrown nail left great toe lateral border. Ankle equinus bilateral. 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 Hypermobile first ray with bunion deformity bilateral.  PLAN: Reviewed clinical findings and available treatment options. Affected nail border debrided. Reviewed stretch exercise for tight Achilles tendon. May benefit from custom orthotics for weak first ray and contracted lesser digits.

## 2016-08-06 NOTE — Patient Instructions (Signed)
Seen for ingrown nail on left great toe. Noted of build up cuticle at lateral border left great toe. Also noted of tight Achilles tendon that needs be stretched daily. May benefit from custom orthotics for weak first ray and contracted lesser toes. Will call with insurance info on orthotics.

## 2016-08-07 ENCOUNTER — Ambulatory Visit: Admitting: Family Medicine

## 2016-09-03 ENCOUNTER — Encounter: Payer: Self-pay | Admitting: Family Medicine

## 2016-09-03 ENCOUNTER — Ambulatory Visit (INDEPENDENT_AMBULATORY_CARE_PROVIDER_SITE_OTHER): Admitting: Family Medicine

## 2016-09-03 VITALS — BP 124/72 | HR 66 | Wt 191.0 lb

## 2016-09-03 DIAGNOSIS — R635 Abnormal weight gain: Secondary | ICD-10-CM

## 2016-09-03 DIAGNOSIS — Z6833 Body mass index (BMI) 33.0-33.9, adult: Secondary | ICD-10-CM | POA: Diagnosis not present

## 2016-09-03 MED ORDER — PHENTERMINE HCL 15 MG PO CAPS: 15.0000 mg | ORAL_CAPSULE | ORAL | 0 refills | Status: DC

## 2016-09-03 NOTE — Progress Notes (Signed)
   Subjective:    Patient ID: Pam Gomez, female    DOB: 02/06/1965, 51 y.o.   MRN: [id number]  HPI 51 year old female comes in today to follow-up for abnormal weight gain. She said initially on the phentermine she lost about 4 pounds but then seemed to fluctuate and gain it back. She has had some dry mouth on and if she takes it too late in the morning than she doesn't sleep well. So more recently she doesn't take it by about 9:30 in the morning and she just gets it. She denies any chest pain or palpitations. She is really made some changes to her diet. She's been eating low carb and trying to cut out sweets. She is trying to drink more water and she's been cutting out things like butter and sugars being added to cooked foods. Recently her daughter was diagnosed with having a borderline A1c so she is trying to get the whole family on board. She is still interested in doing a referral to nutrition.   Review of Systems     Objective:   Physical Exam  Constitutional: She is oriented to person, place, and time. She appears well-developed and well-nourished.  HENT:  Head: Normocephalic and atraumatic.  Cardiovascular: Normal rate, regular rhythm and normal heart sounds.   Pulmonary/Chest: Effort normal and breath sounds normal.  Neurological: She is alert and oriented to person, place, and time.  Skin: Skin is warm and dry.  Psychiatric: She has a normal mood and affect. Her behavior is normal.       Assessment & Plan:  BMI 33/abnormal weight gain-she actually gained 2 pounds on the phentermine but initially did lose some weight. She would like to try it for one more month. We discussed potential side effects and continue to monitor her blood pressure very carefully. We will use 15 mg again daily for a month. Did encourage her to consider starting to calorie count. Some but she's made some fantastic changes already in her diet. Follow-up in 4 weeks with nurse blood pressure and weight check if  she is doing well. If she still needs to discuss and strategize with clinician and then reschedule with PCP.   Diet: Has successfully cut back on carbohydrates and sweets. Doing well with water intake. Trying to avoid extra condiments like butter and sugar. Exercise-overall is active but no specific exercise regimen at this point in time. Medication-continue phentermine 15 mg. I did not want to increase her dose because of side effects. New goals: Recommend using my fitness pal to help track calories. Referral: To nutrition therapy Follow-up: One month nurse visit if doing well, PCP is still struggling.  Time spent 20 minutes, greater than 50% time spent counseling about diet exercise and weight goals.

## 2016-10-03 ENCOUNTER — Ambulatory Visit (INDEPENDENT_AMBULATORY_CARE_PROVIDER_SITE_OTHER): Admitting: Physician Assistant

## 2016-10-03 ENCOUNTER — Ambulatory Visit (INDEPENDENT_AMBULATORY_CARE_PROVIDER_SITE_OTHER): Admitting: Family Medicine

## 2016-10-03 ENCOUNTER — Encounter: Payer: Self-pay | Admitting: Family Medicine

## 2016-10-03 VITALS — BP 113/57 | HR 81 | Wt 190.0 lb

## 2016-10-03 DIAGNOSIS — Z23 Encounter for immunization: Secondary | ICD-10-CM | POA: Diagnosis not present

## 2016-10-03 DIAGNOSIS — M62838 Other muscle spasm: Secondary | ICD-10-CM | POA: Diagnosis not present

## 2016-10-03 MED ORDER — CYCLOBENZAPRINE HCL 5 MG PO TABS: 5.0000 mg | ORAL_TABLET | Freq: Every evening | ORAL | 1 refills | Status: DC | PRN

## 2016-10-03 NOTE — Patient Instructions (Addendum)
Thank you for coming in today. I think the pain is due to trapzieus muscle spasm.  Heating pad, TENS unit and PT are effective.  PT is my most effective treatment.   TENS UNIT: This is helpful for muscle pain and spasm.   Search and Purchase a TENS 7000 2nd edition at  www.tenspros.com or www.[employer].com It should be less than $30.     TENS unit instructions: Do not shower or bathe with the unit on Turn the unit off before removing electrodes or batteries If the electrodes lose stickiness add a drop of water to the electrodes after they are disconnected from the unit and place on plastic sheet. If you continued to have difficulty, call the TENS unit company to purchase more electrodes. Do not apply lotion on the skin area prior to use. Make sure the skin is clean and dry as this will help prolong the life of the electrodes. After use, always check skin for unusual red areas, rash or other skin difficulties. If there are any skin problems, does not apply electrodes to the same area. Never remove the electrodes from the unit by pulling the wires. Do not use the TENS unit or electrodes other than as directed. Do not change electrode placement without consultating your therapist or physician. Keep 2 fingers with between each electrode. Wear time ratio is 2:1, on to off times.    For example on for 30 minutes off for 15 minutes and then on for 30 minutes off for 15 minutes

## 2016-10-03 NOTE — Progress Notes (Signed)
Pam Gomez is a 51 y.o. female who presents to [facility] today for right trapezius pain. Patient notes a 2 to three-day history of pain in the right trapezius. This is associated with stiffness and soreness. She's not sure of any inciting factors or injury but does note that she was moving boxes recently and started working out again. She denies any radiating pain weakness or numbness.  Additionally she notes that her blood pressure has been up and down recently. She measured her blood pressure at [facility] and it was in the 140s. She denies any personal history of hypertension and denies chest pain palpitations or shortness of breath.  Lastly she is interested in both the influenza vaccine and the shingles vaccine.   Past Medical History:  Diagnosis Date  . Sickle cell trait ([place]) 03/21/2015  . Systemic lupus erythematosus ([place]) 03/21/2015   Past Surgical History:  Procedure Laterality Date  . CESAREAN SECTION  2002  . QUADRICEPS TENDON REPAIR  07/06/2000   Social History  Substance Use Topics  . Smoking status: Never Smoker  . Smokeless tobacco: Never Used  . Alcohol use No     ROS:  As above   Medications: Current Outpatient Prescriptions  Medication Sig Dispense Refill  . cyclobenzaprine (FLEXERIL) 5 MG tablet Take 1-2 tablets (5-10 mg total) by mouth at bedtime as needed for muscle spasms. 30 tablet 1  . cycloSPORINE (RESTASIS) 0.05 % ophthalmic emulsion 1 drop 2 (two) times daily.     No current facility-administered medications for this visit.    Allergies  Allergen Reactions  . Iodinated Diagnostic Agents Nausea And Vomiting  . Penicillins Hives     Exam:  BP (!) 113/57   Pulse 81   Wt 190 lb (86.2 kg)   LMP 11/08/2011   SpO2 98%   BMI 32.87 kg/m  General: Well Developed, well nourished, and in no acute distress.  Neuro/Psych: Alert and oriented x3, extra-ocular muscles intact, able to move all 4 extremities,  sensation grossly intact. Skin: Warm and dry, no rashes noted.  Respiratory: Not using accessory muscles, speaking in full sentences, trachea midline. Lungs are clear to auscultation bilaterally normal work of breathing. Cardiovascular: Pulses palpable, no extremity edema.Regular rate and rhythm no murmurs rubs or gallops Abdomen: Does not appear distended. MSK:  C-spine is nontender to midline. Cervical motion intact however pain with left lateral flexion and left rotation Gender to palpation right trapezius. Right shoulder exam with normal motion and strength. Bilateral upper extremity strength sensation reflexes are equal and normal throughout.    No results found for this or any previous visit (from the past 48 hour(s)). No results found.    Assessment and Plan: 51 y.o. female with  Right trapezius spasm and strain. Plan to treat with cyclobenzaprine at night TENS unit heating pad and referral to physical therapy. Plan for watchful waiting and return to clinic if needed.  Blood pressure: Blood pressure is normal today in clinic. Recommend home blood pressure log and recheck with primary care provider as needed.  Vaccines: Influenza and Shingrix vaccines given today. Nurse visit scheduled for 2 months for Shingrix vaccine 2/2.    Orders Placed This Encounter  Procedures  . Varicella-zoster vaccine IM (Shingrix)  . Flu Vaccine QUAD 36+ mos IM  . Ambulatory referral to Physical Therapy    Referral Priority:   Routine    Referral Type:   Physical Medicine    Referral Reason:   Specialty Services  Required    Requested Specialty:   Physical Therapy   Meds ordered this encounter  Medications  . cyclobenzaprine (FLEXERIL) 5 MG tablet    Sig: Take 1-2 tablets (5-10 mg total) by mouth at bedtime as needed for muscle spasms.    Dispense:  30 tablet    Refill:  1    Discussed warning signs or symptoms. Please see discharge instructions. Patient expresses understanding.  I  spent 25 minutes with this patient, greater than 50% was face-to-face time counseling regarding differential diagnosis treatment plan and options and vaccine counseling.

## 2016-10-04 NOTE — Progress Notes (Signed)
No Charge pt was seen by a provider.

## 2016-10-11 ENCOUNTER — Ambulatory Visit (INDEPENDENT_AMBULATORY_CARE_PROVIDER_SITE_OTHER): Admitting: Rehabilitative and Restorative Service Providers"

## 2016-10-11 ENCOUNTER — Encounter: Payer: Self-pay | Admitting: Rehabilitative and Restorative Service Providers"

## 2016-10-11 DIAGNOSIS — R293 Abnormal posture: Secondary | ICD-10-CM

## 2016-10-11 DIAGNOSIS — R29898 Other symptoms and signs involving the musculoskeletal system: Secondary | ICD-10-CM

## 2016-10-11 DIAGNOSIS — M542 Cervicalgia: Secondary | ICD-10-CM | POA: Diagnosis not present

## 2016-10-11 NOTE — Patient Instructions (Addendum)
Axial Extension (Chin Tuck)    Pull chin in and lengthen back of neck. Hold _10___ seconds while counting out loud. Repeat __5__ times. Do __several __ sessions per day. Also do this lying on back head supported on pillow      SUPINE Tips A    Being in the supine position means to be lying on the back. Lying on the back is the position of least compression on the bones and discs of the spine, and helps to re-align the natural curves of the back. Rest arms out to side hold gentle stretch 3-5 min can bend elbows to release stretch as needed   Shoulder Blade Squeeze    Rotate shoulders back, then squeeze shoulder blades down and back. Hold 10 sec Repeat __10__ times. Do ___several_ sessions per day. Can use swim noodle along spine to cue correct posture   Self massage using a 3 inch plastic ball   Scapula Adduction With Pectoralis Stretch: Low - Standing   Shoulders at 45 hands even with shoulders, keeping weight through legs, shift weight forward until you feel pull or stretch through the front of your chest. Hold _30__ seconds. Do _3__ times, _2-4__ times per day.   Scapula Adduction With Pectoralis Stretch: Mid-Range - Standing   Shoulders at 90 elbows even with shoulders, keeping weight through legs, shift weight forward until you feel pull or strength through the front of your chest. Hold __30_ seconds. Do _3__ times, __2-4_ times per day.   Scapula Adduction With Pectoralis Stretch: High - Standing   Shoulders at 120 hands up high on the doorway, keeping weight on feet, shift weight forward until you feel pull or stretch through the front of your chest. Hold _30__ seconds. Do _3__ times, _2-3__ times per day.   TENS UNIT: This is helpful for muscle pain and spasm.   Search and Purchase a TENS 7000 2nd edition at www.tenspros.com. It should be less than $30.     TENS unit instructions: Do not shower or bathe with the unit on Turn the unit off before  removing electrodes or batteries If the electrodes lose stickiness add a drop of water to the electrodes after they are disconnected from the unit and place on plastic sheet. If you continued to have difficulty, call the TENS unit company to purchase more electrodes. Do not apply lotion on the skin area prior to use. Make sure the skin is clean and dry as this will help prolong the life of the electrodes. After use, always check skin for unusual red areas, rash or other skin difficulties. If there are any skin problems, does not apply electrodes to the same area. Never remove the electrodes from the unit by pulling the wires. Do not use the TENS unit or electrodes other than as directed. Do not change electrode placement without consultating your therapist or physician. Keep 2 fingers with between each electrode.

## 2016-10-11 NOTE — Therapy (Signed)
[place], [place], [address] Phone: [phone]   Fax:  [phone]  Physical Therapy Evaluation  Patient Details  Name: Pam Gomez MRN: [id number] Date of Birth: [birth year] Referring Provider: Dr Lynne Leader   Encounter Date: 10/11/2016      PT End of Session - 10/11/16 1617    Visit Number 1   Number of Visits 12   Date for PT Re-Evaluation 11/21/16   PT Start Time 3790   PT Stop Time 1712   PT Time Calculation (min) 55 min   Activity Tolerance Patient tolerated treatment well      Past Medical History:  Diagnosis Date  . Sickle cell trait ([place]) 03/21/2015  . Systemic lupus erythematosus ([place]) 03/21/2015    Past Surgical History:  Procedure Laterality Date  . CESAREAN SECTION  2002  . QUADRICEPS TENDON REPAIR  07/06/2000    There were no vitals filed for this visit.       Subjective Assessment - 10/11/16 1624    Subjective Patient reports that she has had pain in the Rt neck and shoulder for the past month with symptoms increasing in the past two weeks with no known injuries. She has pain and limited mobility in the neck. She has been been doing some stretching and husband has been massaging but the tightness persists.     Pertinent History Rt knee injury quad tear 2016; allergies    How long can you sit comfortably? 2 hours    How long can you stand comfortably? 4 hours    How long can you walk comfortably? 30 min    Patient Stated Goals get rid of the pain and spasms in the shoudler area and feel normal again    Currently in Pain? Yes   Pain Score 4    Pain Location Shoulder   Pain Orientation Right   Pain Descriptors / Indicators Throbbing;Stabbing   Pain Type Acute pain   Pain Radiating Towards into back of head and the upper part of the Rt arm    Pain Onset More than a month ago   Pain Frequency Intermittent   Aggravating Factors  stress; tensing muscules; sitting at  desk and using the mouse/computer    Pain Relieving Factors heat; ice; massage            [facility] PT Assessment - 10/11/16 0001      Assessment   Medical Diagnosis Rt Trapezius strain; myofacial dysfunction    Referring Provider Dr Lynne Leader    Onset Date/Surgical Date 09/07/16   Hand Dominance Right   Next MD Visit PRN    Prior Therapy yes for knee      Precautions   Precautions None     Balance Screen   Has the patient fallen in the past 6 months No   Has the patient had a decrease in activity level because of a fear of falling?  No   Is the patient reluctant to leave their home because of a fear of falling?  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    Leisure household chores; child care      Observation/Other Assessments   Focus on Therapeutic Outcomes (FOTO)  41% limitations     Sensation   Additional Comments WFL's per pt report      Posture/Postural Control  Posture Comments head forward; shoulders rounded and elevated; increased thoracic kyphosis; scapulae abducted and rotated along the thoracic wall      AROM   Overall AROM Comments pain with extension; lateral flexion and rotation of cervical spine    Right/Left Shoulder --  tight end range Rt shd elevation w/ some pain    Cervical Flexion 51   Cervical Extension 30   Cervical - Right Side Bend 30   Cervical - Left Side Bend 16   Cervical - Right Rotation 64   Cervical - Left Rotation 61     Strength   Overall Strength Comments 5/5 bilat UE except lower trap 5-/5 and painful Rt      Palpation   Spinal mobility hypomobile cervical and upper thoracic spine with CPA and lateral mobs    Palpation comment significant muscular tightness Rt > Lt pecs; ant/lat/post cervical musculature; upper traps; leveator; teres     Special Tests    Special Tests --  (+) neural tension test bilat UE's Rt > Lt              Objective measurements completed on examination: See above findings.          [facility] [place] Adult PT Treatment/Exercise - 10/11/16 0001      Therapeutic Activites    Therapeutic Activities --  myofacial ball relese work      Neuro Re-ed    Neuro Re-ed Details  working on posture and alignment engaging posterior shoudler girdle muscualture      Shoulder Exercises: Supine   Other Supine Exercises chin tuck 10 sec x 5    Other Supine Exercises snow angel 1-2 min hold x 2 reps shoulders at ~ 80 deg abduction      Shoulder Exercises: Standing   Other Standing Exercises scap squeeze 10 sec x 10 with swim noodle    Other Standing Exercises axial extension 10 sec x 5      Shoulder Exercises: Stretch   Other Shoulder Stretches 3 way doorway stretch 30 sec x 3 reps      Moist Heat Therapy   Number Minutes Moist Heat 8 Minutes   Moist Heat Location Cervical;Shoulder     Manual Therapy   Manual therapy comments pt supine    Joint Mobilization CPA mobs upper thoracic and lower cervical    Soft tissue mobilization ant/lat/post cervical musculature; pecs; upper traps                 PT Education - 10/11/16 1708    Education provided Yes   Education Details HEP TENS unit; to work on Teacher, 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) Educated Patient   Methods Explanation;Demonstration;Tactile cues;Verbal cues;Handout   Comprehension Verbalized understanding;Returned demonstration;Verbal cues required;Tactile cues required             PT Long Term Goals - 10/11/16 1727      PT LONG TERM GOAL #1   Title I with advanced HEP 11/22/16   Time 6   Period Weeks   Status New     PT LONG TERM GOAL #2   Title Improve cervical and Rt shoudler ROM to WFL's and pain free throughout 11/22/16   Time 6   Period Weeks   Status New     PT LONG TERM GOAL #3   Title Painfree for normal functional and work activities 11/22/16   Time 6   Period Weeks   Status New     PT  LONG TERM GOAL #4   Title Improve posture and alignment with patient to demonstrate improved upright posture and alignment with posterior shoudler girdle engaged 11/22/16   Time 6   Period Weeks   Status New     PT LONG TERM GOAL #5   Title improve FOTO =/< 27% limited 11/22/16   Time 6   Period Days   Status New                Plan - 10/11/16 1719    Clinical Impression Statement Pam Gomez presents with at least one month history of cervical and right shoulder pain and dysfunction with the symptoms increasing in the past two weeks. She has poor posture and alignment; limited Rt shoulder ROM; decreased cervical ROM/mobilty; muscular tightness through Rt upper quarter; pain with functional activities and difficulty sleeping. Pam Gomez has muscular imbalance through pecs and posterior shoudler girdle musculature as well as upper traps and middle/lower traps. She states that she does prop her phone between her neck and shoulder when at work. She sits at a computer 8 hr/day which contributes to poor posture and muscular imbalance. Pam Gomez will benefit form PT to address problems identified.    Clinical Presentation Evolving   Clinical Decision Making Low   Rehab Potential Good   PT Frequency 2x / week   PT Duration 6 weeks   PT Treatment/Interventions Patient/family education;ADLs/Self Care Home Management;Cryotherapy;Electrical Stimulation;Iontophoresis 4mg /ml Dexamethasone;Moist Heat;Ultrasound;Dry needling;Manual techniques;Therapeutic activities;Therapeutic exercise;Neuromuscular re-education   PT Next Visit Plan review HEP; continue with ergonomic education; add neural mobilization; neuromuscular re-education; manual work through ant/lat/post cervical, pecs, traps Rt > Lt; modalities as indicated    Consulted and Agree with Plan of Care Patient      Patient will benefit from skilled therapeutic intervention in order to improve the following deficits and impairments:  Postural dysfunction,  Improper body mechanics, Pain, Increased fascial restricitons, Increased muscle spasms, Decreased range of motion, Decreased mobility, Decreased activity tolerance, Decreased strength  Visit Diagnosis: Cervicalgia - Plan: PT plan of care cert/re-cert  Other symptoms and signs involving the musculoskeletal system - Plan: PT plan of care cert/re-cert  Abnormal posture - Plan: PT plan of care cert/re-cert     Problem List Patient Active Problem List   Diagnosis Date Noted  . Right knee pain 07/12/2015  . Elevated platelet count 07/12/2015  . H/O colonoscopy 03/29/2015  . Systemic lupus erythematosus ([place]) 03/21/2015  . Sickle cell trait ([place]) 03/21/2015  . History of non anemic vitamin B12 deficiency 03/21/2015  . Chest discomfort 03/21/2015  . Chronic fatigue 03/21/2015    Sireen Halk Nilda Simmer PT, MPH  10/11/2016, 5:33 PM  [facility] [place], [place], [address] Phone: [phone]   Fax:  [phone]  Name: Tynika Luddy MRN: [id number] Date of Birth: 10-20-1965

## 2016-10-22 ENCOUNTER — Ambulatory Visit (INDEPENDENT_AMBULATORY_CARE_PROVIDER_SITE_OTHER): Admitting: Rehabilitative and Restorative Service Providers"

## 2016-10-22 ENCOUNTER — Encounter: Payer: Self-pay | Admitting: Rehabilitative and Restorative Service Providers"

## 2016-10-22 DIAGNOSIS — R29898 Other symptoms and signs involving the musculoskeletal system: Secondary | ICD-10-CM

## 2016-10-22 DIAGNOSIS — M542 Cervicalgia: Secondary | ICD-10-CM

## 2016-10-22 DIAGNOSIS — R293 Abnormal posture: Secondary | ICD-10-CM | POA: Diagnosis not present

## 2016-10-22 NOTE — Therapy (Signed)
[place], [place], [address] Phone: [phone]   Fax:  [phone]  Physical Therapy Treatment  Patient Details  Name: Pam Gomez MRN: [id number] Date of Birth: [birth year] Referring Provider: Dr Lynne Leader   Encounter Date: 10/22/2016      PT End of Session - 10/22/16 1517    Visit Number 2   Number of Visits 12   Date for PT Re-Evaluation 11/21/16   PT Start Time 6237   PT Stop Time 1612   PT Time Calculation (min) 55 min   Activity Tolerance Patient tolerated treatment well      Past Medical History:  Diagnosis Date  . Sickle cell trait ([place]) 03/21/2015  . Systemic lupus erythematosus ([place]) 03/21/2015    Past Surgical History:  Procedure Laterality Date  . CESAREAN SECTION  2002  . QUADRICEPS TENDON REPAIR  07/06/2000    There were no vitals filed for this visit.      Subjective Assessment - 10/22/16 1520    Subjective Patient reports that she is 60-65% improved since initial visit. She has a temporary headset so she is not propping her phone anymore - which has really helped.    Currently in Pain? Yes   Pain Score 0-No pain   Pain Location Shoulder   Pain Orientation Right   Pain Descriptors / Indicators Nagging   Pain Type Acute pain   Pain Onset More than a month ago   Pain Frequency Intermittent            [facility] PT Assessment - 10/22/16 0001      Assessment   Medical Diagnosis Rt Trapezius strain; myofacial dysfunction    Referring Provider Dr Lynne Leader    Onset Date/Surgical Date 09/07/16   Hand Dominance Right   Next MD Visit PRN    Prior Therapy yes for knee      AROM   Cervical Flexion 54   Cervical Extension 37   Cervical - Right Side Bend 35   Cervical - Left Side Bend 24   Cervical - Right Rotation 68   Cervical - Left Rotation 65     Palpation   Palpation comment significant muscular tightness Rt > Lt pecs; ant/lat/post cervical musculature;  upper traps; leveator; teres                     [facility] Adult PT Treatment/Exercise - 10/22/16 0001      Shoulder Exercises: Supine   Other Supine Exercises chin tuck 10 sec x 5    Other Supine Exercises snow angel 1-2 min hold x 2 reps shoulders at ~ 80 deg abduction      Shoulder Exercises: Standing   Extension Strengthening;Both;20 reps;Theraband   Theraband Level (Shoulder Extension) Level 2 (Red)   Row Strengthening;Both;20 reps;Theraband   Theraband Level (Shoulder Row) Level 2 (Red)   Retraction Strengthening;Both;20 reps;Theraband   Theraband Level (Shoulder Retraction) Level 1 (Yellow)   Other Standing Exercises scap squeeze 10 sec x 10 with swim noodle    Other Standing Exercises axial extension 10 sec x 5      Shoulder Exercises: Stretch   Other Shoulder Stretches 3 way doorway stretch 30 sec x 3 reps      Moist Heat Therapy   Number Minutes Moist Heat 20 Minutes   Moist Heat Location Cervical;Lumbar Spine     Electrical Stimulation   Electrical Stimulation Location bilat cervical upper traps    Electrical  Stimulation Action IFC   Electrical Stimulation Parameters to tolerance   Electrical Stimulation Goals Pain;Tone     Manual Therapy   Manual therapy comments pt supine    Joint Mobilization CPA mobs upper thoracic and lower cervical    Soft tissue mobilization ant/lat/post cervical musculature; pecs; upper traps                 PT Education - 10/22/16 1534    Education provided Yes   Education Details HEP    Person(s) Educated Patient   Methods Explanation;Demonstration;Tactile cues;Verbal cues;Handout   Comprehension Verbalized understanding;Returned demonstration;Verbal cues required;Tactile cues required             PT Long Term Goals - 10/22/16 1519      PT LONG TERM GOAL #1   Title I with advanced HEP 11/22/16   Time 6   Period Weeks   Status On-going     PT LONG TERM GOAL #2   Title Improve cervical and Rt shoudler  ROM to WFL's and pain free throughout 11/22/16   Time 6   Period Weeks   Status On-going     PT LONG TERM GOAL #3   Title Painfree for normal functional and work activities 11/22/16   Time 6   Period Weeks   Status On-going     PT LONG TERM GOAL #4   Title Improve posture and alignment with patient to demonstrate improved upright posture and alignment with posterior shoudler girdle engaged 11/22/16   Time 6   Period Weeks   Status On-going     PT LONG TERM GOAL #5   Title improve FOTO =/< 27% limited 11/22/16   Time 6   Period Weeks               Plan - 10/22/16 1521    Clinical Impression Statement Janaiah demonstrates improved symptoms. She is working on ergonomic changes at work which have helped. She has improved cervical and thoracic posture and alignment. There is less palpable tightness through the cervical musculature. Progressing well toward stated goals of therapy.    Rehab Potential Good   PT Frequency 2x / week   PT Duration 6 weeks   PT Treatment/Interventions Patient/family education;ADLs/Self Care Home Management;Cryotherapy;Electrical Stimulation;Iontophoresis 4mg /ml Dexamethasone;Moist Heat;Ultrasound;Dry needling;Manual techniques;Therapeutic activities;Therapeutic exercise;Neuromuscular re-education   PT Next Visit Plan review HEP; continue with ergonomic education; add neural mobilization; neuromuscular re-education; manual work through ant/lat/post cervical, pecs, traps Rt > Lt; modalities as indicated    Consulted and Agree with Plan of Care Patient      Patient will benefit from skilled therapeutic intervention in order to improve the following deficits and impairments:  Postural dysfunction, Improper body mechanics, Pain, Increased fascial restricitons, Increased muscle spasms, Decreased range of motion, Decreased mobility, Decreased activity tolerance, Decreased strength  Visit Diagnosis: Cervicalgia  Other symptoms and signs involving the  musculoskeletal system  Abnormal posture     Problem List Patient Active Problem List   Diagnosis Date Noted  . Right knee pain 07/12/2015  . Elevated platelet count 07/12/2015  . H/O colonoscopy 03/29/2015  . Systemic lupus erythematosus ([place]) 03/21/2015  . Sickle cell trait ([place]) 03/21/2015  . History of non anemic vitamin B12 deficiency 03/21/2015  . Chest discomfort 03/21/2015  . Chronic fatigue 03/21/2015    Iowa Kappes Nilda Simmer PT, MPH 10/22/2016, 3:57 PM  [facility] [place], [place], [address] Phone: [phone]   Fax:  [phone]  Name:  Pam Gomez MRN: [id number] Date of Birth: May 21, 1965

## 2016-10-22 NOTE — Patient Instructions (Signed)
Resisted External Rotation: in Neutral - Bilateral   PALMS UP Sit or stand, tubing in both hands, elbows at sides, bent to 90, forearms forward. Pinch shoulder blades together and rotate forearms out. Keep elbows at sides. Repeat __10__ times per set. Do _2-3___ sets per session. Do _1-2___ sessions per day.   Low Row: Standing   Face anchor, feet shoulder width apart. Palms up, pull arms back, squeezing shoulder blades together. Repeat 10__ times per set. Do 2-3__ sets per session. Do 1-2__ sessions per day. Anchor Height: Waist   Strengthening: Resisted Extension   Hold tubing in right hand, arm forward. Pull arm back, elbow straight. Repeat _10___ times per set. Do 2-3____ sets per session. Do 2-3____ sessions per day.

## 2016-10-25 ENCOUNTER — Encounter: Payer: Self-pay | Admitting: Rehabilitative and Restorative Service Providers"

## 2016-10-25 ENCOUNTER — Ambulatory Visit (INDEPENDENT_AMBULATORY_CARE_PROVIDER_SITE_OTHER): Admitting: Rehabilitative and Restorative Service Providers"

## 2016-10-25 DIAGNOSIS — R29898 Other symptoms and signs involving the musculoskeletal system: Secondary | ICD-10-CM

## 2016-10-25 DIAGNOSIS — R293 Abnormal posture: Secondary | ICD-10-CM

## 2016-10-25 DIAGNOSIS — M542 Cervicalgia: Secondary | ICD-10-CM

## 2016-10-25 NOTE — Therapy (Addendum)
[place], [place], [address] Phone: [phone]   Fax:  [phone]  Physical Therapy Treatment  Patient Details  Name: Pam Gomez MRN: [id number] Date of Birth: 1965/04/07 Referring Provider: Dr Lynne Leader   Encounter Date: 10/25/2016      PT End of Session - 10/25/16 1711    Visit Number 3   Number of Visits 12   Date for PT Re-Evaluation 11/21/16   PT Start Time 1708   PT Stop Time 1755   PT Time Calculation (min) 47 min   Activity Tolerance Patient tolerated treatment well      Past Medical History:  Diagnosis Date  . Sickle cell trait ([place]) 03/21/2015  . Systemic lupus erythematosus ([place]) 03/21/2015    Past Surgical History:  Procedure Laterality Date  . CESAREAN SECTION  2002  . QUADRICEPS TENDON REPAIR  07/06/2000    There were no vitals filed for this visit.      Subjective Assessment - 10/25/16 1711    Subjective Neck and shoudler are a little tight today but overall improving. Not propping the phone.    Currently in Pain? Yes   Pain Score 4    Pain Location Shoulder   Pain Orientation Right   Pain Descriptors / Indicators Tightness;Throbbing                         [facility] Adult PT Treatment/Exercise - 10/25/16 0001      Shoulder Exercises: Seated   Other Seated Exercises lateral cervical flexion 10 sec x 3 each    Other Seated Exercises upper trap stretch with overpressure 10 sec x 3 each side      Shoulder Exercises: Standing   Extension Strengthening;Both;20 reps;Theraband   Theraband Level (Shoulder Extension) Level 2 (Red)   Row Strengthening;Both;20 reps;Theraband   Theraband Level (Shoulder Row) Level 2 (Red)   Retraction Strengthening;Both;20 reps;Theraband   Theraband Level (Shoulder Retraction) Level 1 (Yellow)   Retraction Limitations L's W's x 10each    Other Standing Exercises scap squeeze 10 sec x 10 with swim noodle    Other  Standing Exercises axial extension 10 sec x 5      Shoulder Exercises: ROM/Strengthening   UBE (Upper Arm Bike) L2 x 2 min alt fwd/back      Shoulder Exercises: Stretch   Other Shoulder Stretches 3 way doorway stretch 30 sec x 3 reps      Moist Heat Therapy   Number Minutes Moist Heat 15 Minutes   Moist Heat Location Cervical     Electrical Stimulation   Electrical Stimulation Location bilat cervical upper traps    Electrical Stimulation Action IFC   Electrical Stimulation Parameters to tolerance   Electrical Stimulation Goals Pain;Tone     Manual Therapy   Manual therapy comments pt supine    Joint Mobilization CPA mobs upper thoracic and lower cervical    Soft tissue mobilization ant/lat/post cervical musculature; pecs; upper traps    Manual Traction cervical 15-20 sec pull x 4 throughout treatment                 PT Education - 10/25/16 1727    Education provided Yes   Education Details HEP    Person(s) Educated Patient   Methods Explanation;Demonstration;Tactile cues;Verbal cues;Handout   Comprehension Verbalized understanding;Returned demonstration;Verbal cues required;Tactile cues required             PT Long Term Goals -  10/22/16 1519      PT LONG TERM GOAL #1   Title I with advanced HEP 11/22/16   Time 6   Period Weeks   Status On-going     PT LONG TERM GOAL #2   Title Improve cervical and Rt shoudler ROM to WFL's and pain free throughout 11/22/16   Time 6   Period Weeks   Status On-going     PT LONG TERM GOAL #3   Title Painfree for normal functional and work activities 11/22/16   Time 6   Period Weeks   Status On-going     PT LONG TERM GOAL #4   Title Improve posture and alignment with patient to demonstrate improved upright posture and alignment with posterior shoudler girdle engaged 11/22/16   Time 6   Period Weeks   Status On-going     PT LONG TERM GOAL #5   Title improve FOTO =/< 27% limited 11/22/16   Time 6   Period Weeks                Plan - 10/25/16 1742    Clinical Impression Statement Muscular tightness noted Rt ant/lat/posterior cervical into Rt upper trap/leveator. Symptoms improving. Added stretching and postural strengthening without difficulty today.    Rehab Potential Good   PT Frequency 2x / week   PT Treatment/Interventions Patient/family education;ADLs/Self Care Home Management;Cryotherapy;Electrical Stimulation;Iontophoresis 33m/ml Dexamethasone;Moist Heat;Ultrasound;Dry needling;Manual techniques;Therapeutic activities;Therapeutic exercise;Neuromuscular re-education   PT Next Visit Plan review HEP; continue with ergonomic education; add neural mobilization; neuromuscular re-education; manual work through ant/lat/post cervical, pecs, traps Rt > Lt; modalities as indicated    Consulted and Agree with Plan of Care Patient      Patient will benefit from skilled therapeutic intervention in order to improve the following deficits and impairments:  Postural dysfunction, Improper body mechanics, Pain, Increased fascial restricitons, Increased muscle spasms, Decreased range of motion, Decreased mobility, Decreased activity tolerance, Decreased strength  Visit Diagnosis: Cervicalgia  Other symptoms and signs involving the musculoskeletal system  Abnormal posture     Problem List Patient Active Problem List   Diagnosis Date Noted  . Right knee pain 07/12/2015  . Elevated platelet count 07/12/2015  . H/O colonoscopy 03/29/2015  . [place] ([place] 03/21/2015  . S[place] cell trait ([place] 03/21/2015  . History of non anemic vitamin B12 deficiency 03/21/2015  . Chest discomfort 03/21/2015  . Chronic fatigue 03/21/2015    Advik Weatherspoon PNilda SimmerPT, MPH  10/25/2016, 5:44 PM  [facility] [address]Phone: [phone]  Fax:  [phone] Name: Pam SeatMRN: [id number] of Birth:  512/20/1967 PHYSICAL THERAPY DISCHARGE SUMMARY  Visits from Start of Care: 3  Current functional level related to goals / functional outcomes: See last progress note for discharge status   Remaining deficits: Unknown    Education / Equipment: HEP Plan: Patient agrees to discharge.  Patient goals were partially met. Patient is being discharged due to not returning since the last visit.  ?????     Tere Mcconaughey P. HHelene KelpPT, MPH 12/07/16 11:58 AM

## 2016-10-25 NOTE — Patient Instructions (Addendum)
AROM: Lateral Neck Flexion    Slowly tilt head toward one shoulder, then the other. Hold each position _10___ seconds. Repeat __3__ times per set. Do __3-4 __ sessions per day.   Flexibility: Upper Trapezius Stretch    Gently grasp right side of head while reaching behind back with other hand. Tilt head away until a gentle stretch is felt. Hold ____ seconds. Repeat ____ times per set. Do ____ sets per session. Do ____ sessions per day.    Upper Back Strength: Lower Trapezius / Rotator Cuff    Arms in waitress pose, palms up. Press hands back and slide shoulder blades down. Hold for _3-4___ breaths. Repeat _5-10___ times.    Scapular Retraction: Elbow Flexion (Standing)    With elbows bent to 90, pinch shoulder blades together and rotate arms out, keeping elbows bent. Repeat _10___ times per set. Do __1-2__ sets per session. Do __several __ sessions per day.

## 2016-10-29 ENCOUNTER — Encounter: Admitting: Rehabilitative and Restorative Service Providers"

## 2016-10-31 ENCOUNTER — Emergency Department (HOSPITAL_BASED_OUTPATIENT_CLINIC_OR_DEPARTMENT_OTHER)
Admission: EM | Admit: 2016-10-31 | Discharge: 2016-10-31 | Disposition: A | Discharge status: Home / Self Care | Attending: Emergency Medicine | Admitting: Emergency Medicine

## 2016-10-31 ENCOUNTER — Emergency Department (HOSPITAL_BASED_OUTPATIENT_CLINIC_OR_DEPARTMENT_OTHER)

## 2016-10-31 ENCOUNTER — Encounter (HOSPITAL_BASED_OUTPATIENT_CLINIC_OR_DEPARTMENT_OTHER): Payer: Self-pay

## 2016-10-31 DIAGNOSIS — Y92410 Unspecified street and highway as the place of occurrence of the external cause: Secondary | ICD-10-CM | POA: Insufficient documentation

## 2016-10-31 DIAGNOSIS — Y999 Unspecified external cause status: Secondary | ICD-10-CM | POA: Insufficient documentation

## 2016-10-31 DIAGNOSIS — M542 Cervicalgia: Secondary | ICD-10-CM | POA: Diagnosis present

## 2016-10-31 DIAGNOSIS — Y939 Activity, unspecified: Secondary | ICD-10-CM | POA: Diagnosis not present

## 2016-10-31 MED ORDER — IBUPROFEN 800 MG PO TABS: 800.0000 mg | ORAL_TABLET | Freq: Three times a day (TID) | ORAL | 0 refills | Status: DC | PRN

## 2016-10-31 MED ORDER — CYCLOBENZAPRINE HCL 10 MG PO TABS: 10.0000 mg | ORAL_TABLET | Freq: Two times a day (BID) | ORAL | 0 refills | Status: DC | PRN
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
## 2016-10-31 NOTE — Discharge Instructions (Signed)
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
## 2016-11-01 ENCOUNTER — Encounter: Admitting: Rehabilitative and Restorative Service Providers"

## 2016-12-10 ENCOUNTER — Encounter: Payer: Self-pay | Admitting: Physician Assistant

## 2016-12-10 ENCOUNTER — Ambulatory Visit (INDEPENDENT_AMBULATORY_CARE_PROVIDER_SITE_OTHER): Admitting: Physician Assistant

## 2016-12-10 VITALS — BP 119/55 | HR 61 | Ht 63.0 in | Wt 192.0 lb

## 2016-12-10 DIAGNOSIS — Z9109 Other allergy status, other than to drugs and biological substances: Secondary | ICD-10-CM | POA: Insufficient documentation

## 2016-12-10 DIAGNOSIS — Z8639 Personal history of other endocrine, nutritional and metabolic disease: Secondary | ICD-10-CM

## 2016-12-10 DIAGNOSIS — Z8619 Personal history of other infectious and parasitic diseases: Secondary | ICD-10-CM | POA: Insufficient documentation

## 2016-12-10 DIAGNOSIS — E559 Vitamin D deficiency, unspecified: Secondary | ICD-10-CM

## 2016-12-10 DIAGNOSIS — M25561 Pain in right knee: Secondary | ICD-10-CM | POA: Diagnosis not present

## 2016-12-10 DIAGNOSIS — M329 Systemic lupus erythematosus, unspecified: Secondary | ICD-10-CM | POA: Diagnosis not present

## 2016-12-10 DIAGNOSIS — Z6834 Body mass index (BMI) 34.0-34.9, adult: Secondary | ICD-10-CM

## 2016-12-10 DIAGNOSIS — G8929 Other chronic pain: Secondary | ICD-10-CM

## 2016-12-10 DIAGNOSIS — E6609 Other obesity due to excess calories: Secondary | ICD-10-CM | POA: Insufficient documentation

## 2016-12-10 DIAGNOSIS — Z6835 Body mass index (BMI) 35.0-35.9, adult: Secondary | ICD-10-CM

## 2016-12-10 DIAGNOSIS — E66811 Other obesity due to excess calories: Secondary | ICD-10-CM

## 2016-12-10 HISTORY — DX: Vitamin D deficiency, unspecified: E55.9

## 2016-12-10 HISTORY — DX: Other allergy status, other than to drugs and biological substances: Z91.09

## 2016-12-10 HISTORY — DX: Other obesity due to excess calories: E66.09

## 2016-12-10 HISTORY — DX: Other obesity due to excess calories: E66.811

## 2016-12-10 HISTORY — DX: Personal history of other infectious and parasitic diseases: Z86.19

## 2016-12-10 MED ORDER — CYANOCOBALAMIN 1000 MCG/ML IJ SOLN
1000.0000 ug | Freq: Once | INTRAMUSCULAR | Status: AC
  Administered 2016-12-10: 1000 ug via INTRAMUSCULAR

## 2016-12-10 MED ORDER — LORCASERIN HCL ER 20 MG PO TB24: 1.0000 | ORAL_TABLET | Freq: Every day | ORAL | 2 refills | Status: DC

## 2016-12-10 MED ORDER — ERGOCALCIFEROL 1.25 MG (50000 UT) PO CAPS: 50000.0000 [IU] | ORAL_CAPSULE | ORAL | 0 refills | Status: DC

## 2016-12-10 NOTE — Progress Notes (Signed)
Subjective:    Patient ID: Pam Gomez, female    DOB: 1965/08/10, 51 y.o.   MRN: [id number]  HPI  Patient is a 51 year old pleasant female who presents to the clinic to establish care with myself.  She has seen another provider in the office and requested a change.  Today we are here to go over her chronic medical conditions.  She has lupus which is managed by rheumatology, Dr. Otho Ket.  She has severe environmental allergies where she gets biweekly injections.  Managed by Dr. Clarene Essex.  Patient needs a letter today for the [employer] so that she can stay [place] an off [place] hotel instead of [place] due to her allergies.  She had labs done in June and her B12 was on the low normal and her vitamin D was in the insufficiency range.  Patient had shingles in July of this year.  She is here for her second shingles injection.  She mentions Dr. Madilyn Fireman gave her phentermine for weight loss.  She did not like the way she felt on this. She wonders if there is anything else she can try. She is exercising in form on walking regularly.   .. Active Ambulatory Problems    Diagnosis Date Noted  . Systemic lupus erythematosus ([place]) 03/21/2015  . Sickle cell trait ([place]) 03/21/2015  . History of non anemic vitamin B12 deficiency 03/21/2015  . Chest discomfort 03/21/2015  . Chronic fatigue 03/21/2015  . H/O colonoscopy 03/29/2015  . Right knee pain 07/12/2015  . Elevated platelet count 07/12/2015  . History of shingles 12/10/2016  . Environmental allergies 12/10/2016  . Class 1 obesity due to excess calories without serious comorbidity with body mass index (BMI) of 34.0 to 34.9 in adult 12/10/2016  . Vitamin D insufficiency 12/10/2016   Resolved Ambulatory Problems    Diagnosis Date Noted  . No Resolved Ambulatory Problems   Past Medical History:  Diagnosis Date  . Sickle cell trait ([place]) 03/21/2015  . Systemic lupus erythematosus ([place]) 03/21/2015      Review of Systems  All other systems  reviewed and are negative.      Objective:   Physical Exam  Constitutional: She is oriented to person, place, and time. She appears well-developed and well-nourished.  HENT:  Head: Normocephalic and atraumatic.  Neck: Normal range of motion. Neck supple. No thyromegaly present.  Cardiovascular: Normal rate, regular rhythm and normal heart sounds.  Pulmonary/Chest: Effort normal and breath sounds normal. She has no wheezes.  Neurological: She is alert and oriented to person, place, and time.  Psychiatric: She has a normal mood and affect. Her behavior is normal.          Assessment & Plan:  Marland KitchenMarland KitchenDiagnoses and all orders for this visit:  History of non anemic vitamin B12 deficiency -     cyanocobalamin ((VITAMIN B-12)) injection 1,000 mcg  History of shingles -     Varicella-zoster vaccine IM (Shingrix)  Systemic lupus erythematosus, unspecified SLE type, unspecified organ involvement status (HCC)  Class 1 obesity due to excess calories without serious comorbidity with body mass index (BMI) of 34.0 to 34.9 in adult -     Lorcaserin HCl ER (BELVIQ XR) 20 MG TB24; Take 1 tablet daily by mouth.  Chronic pain of right knee  Environmental allergies  Vitamin D insufficiency -     ergocalciferol (VITAMIN D2) 50000 units capsule; Take 1 capsule (50,000 Units total) once a week by mouth.   Given b12 shot today to give  her a boost. In normal range but not above 400. Start b12 1041mcg daily recheck in 3 months.   Start vitamin D weekly for 3 months and will check in 3 months.   Marland Kitchen.Discussed low carb diet with 1500 calories and 80g of protein.  Exercising at least 150 minutes a week.  [employer] could be a Microbiologist.  Started belviq. Discussed side effects. Follow up in 2 months.   Consider tumeric for knee pain. If continues to bother you consider sports medicine appt.   Wrote letter for patient to be able to stay outside [place] due to environmental allergies.    Marland Kitchen.Spent 30 minutes with patient and greater than 50 percent of visit spent counseling patient regarding treatment plan.

## 2016-12-10 NOTE — Patient Instructions (Addendum)
b12 sublingual 1036mcg Vitamin D weekly.  Tumeric 500mg  twice a day.   saxenda-injection belviq- pill.

## 2016-12-18 ENCOUNTER — Telehealth: Payer: Self-pay | Admitting: *Deleted

## 2016-12-18 DIAGNOSIS — Z6834 Body mass index (BMI) 34.0-34.9, adult: Principal | ICD-10-CM

## 2016-12-18 DIAGNOSIS — E6609 Other obesity due to excess calories: Secondary | ICD-10-CM

## 2016-12-18 NOTE — Telephone Encounter (Signed)
Pre Authorization sent to cover my meds.[id number]

## 2016-12-27 NOTE — Telephone Encounter (Signed)
Denied. Has to have engaged in dietary restriction for at least 6 months and failed to achieve desired weight loss.

## 2016-12-27 NOTE — Telephone Encounter (Signed)
Will you please and let pt know.  Marland Kitchen.Discussed low carb diet with 1500 calories and 80g of protein.  Exercising at least 150 minutes a week.  [employer] could be a Microbiologist.

## 2017-01-02 NOTE — Telephone Encounter (Signed)
Patient notified; she would like a referral placed for nutrition counseling.

## 2017-01-04 NOTE — Telephone Encounter (Signed)
Referral made. Insurance may not pay for this either. At times if you do not have diabetes dx they will not. If not then consider going through a local gym or personal trainer they ususally have nutrition training.

## 2017-01-04 NOTE — Telephone Encounter (Signed)
A child answered the phone and stated the patient was not there. Left a message them asking them to tell her to call our office back

## 2017-02-18 ENCOUNTER — Ambulatory Visit (INDEPENDENT_AMBULATORY_CARE_PROVIDER_SITE_OTHER): Admitting: Physician Assistant

## 2017-02-18 ENCOUNTER — Encounter: Payer: Self-pay | Admitting: Physician Assistant

## 2017-02-18 VITALS — BP 136/62 | HR 76 | Ht 63.0 in | Wt 187.0 lb

## 2017-02-18 DIAGNOSIS — R195 Other fecal abnormalities: Secondary | ICD-10-CM

## 2017-02-18 DIAGNOSIS — R194 Change in bowel habit: Secondary | ICD-10-CM | POA: Diagnosis not present

## 2017-02-18 DIAGNOSIS — Z9109 Other allergy status, other than to drugs and biological substances: Secondary | ICD-10-CM

## 2017-02-18 DIAGNOSIS — Z6834 Body mass index (BMI) 34.0-34.9, adult: Secondary | ICD-10-CM

## 2017-02-18 DIAGNOSIS — E6609 Other obesity due to excess calories: Secondary | ICD-10-CM

## 2017-02-18 MED ORDER — LORCASERIN HCL ER 20 MG PO TB24: 1.0000 | ORAL_TABLET | Freq: Every day | ORAL | 2 refills | Status: DC

## 2017-02-18 NOTE — Patient Instructions (Addendum)
Thrive shakes, patches, vitamins.  Consider increasing fiber and start a daily supplement.

## 2017-02-18 NOTE — Progress Notes (Signed)
Subjective:    Patient ID: Pam Gomez, female    DOB: 1965/10/18, 52 y.o.   MRN: [id number]  HPI  Pt is a 52 yo obese female who presents to the clinic to follow up on weight and bowel movements.   Pt was not able to afford belviq. She has already tried phentermine and lost weight but feels it is a temporary fix. She is working on her diet and exercise. She has lost 7lbs. She has appt with nutritionist today. She is exercising. She is not sure if she has used belviq card.   She does have loose consistency to her stool and feels like she has "sit for a long time for it all to come out". She also feels "she has to wipe multiple times to get clean". She denies any straining or hard stools.  She has had a recent colonoscopy that was normal. She has tried probiotics and seem to make her GI symptoms more loose.   Pt also request to write a more specific letter for [employer] that spells out why she cannot spend the night in the barracks due to her environmental allergies. Pt has allergist, Dr. Clarene Gomez, who has also written a letter.   .. Active Ambulatory Problems    Diagnosis Date Noted  . Systemic lupus erythematosus ([place]) 03/21/2015  . Sickle cell trait ([place]) 03/21/2015  . History of non anemic vitamin B12 deficiency 03/21/2015  . Chest discomfort 03/21/2015  . Chronic fatigue 03/21/2015  . H/O colonoscopy 03/29/2015  . Right knee pain 07/12/2015  . Elevated platelet count 07/12/2015  . History of shingles 12/10/2016  . Environmental allergies 12/10/2016  . Class 1 obesity due to excess calories without serious comorbidity with body mass index (BMI) of 34.0 to 34.9 in adult 12/10/2016  . Vitamin D insufficiency 12/10/2016   Resolved Ambulatory Problems    Diagnosis Date Noted  . No Resolved Ambulatory Problems   Past Medical History:  Diagnosis Date  . Sickle cell trait ([place]) 03/21/2015  . Systemic lupus erythematosus ([place]) 03/21/2015     Review of Systems See HPI.      Objective:   Physical Exam  Constitutional: She is oriented to person, place, and time. She appears well-developed and well-nourished.  Obese.   HENT:  Head: Normocephalic and atraumatic.  Eyes: Conjunctivae are normal.  Neck: Normal range of motion. Neck supple.  Cardiovascular: Normal rate, regular rhythm and normal heart sounds.  Pulmonary/Chest: Effort normal and breath sounds normal.  Abdominal: Soft. Bowel sounds are normal. She exhibits no distension and no mass. There is no tenderness. There is no rebound and no guarding.  Neurological: She is alert and oriented to person, place, and time.  Psychiatric: She has a normal mood and affect. Her behavior is normal.          Assessment & Plan:  Marland KitchenMarland KitchenJuletta was seen today for obesity.  Diagnoses and all orders for this visit:  Class 1 obesity due to excess calories without serious comorbidity with body mass index (BMI) of 34.0 to 34.9 in adult -     Lorcaserin HCl ER (BELVIQ XR) 20 MG TB24; Take 1 tablet by mouth daily.  Change in stool caliber  Environmental allergies  failed phentermine.  Gave patient a belviq card and encouraged to activate before taking belviq to have filled. Continue with diet and exercise. Hopefully will learn some good options for nutirition while seeing nutritionist. Encouraged thrive vitamins for energy and meal supplement whit trying to lose weight.  Discussed stools. Seems like she is BRistol stage 3. Encouraged her to increase fiber but stay hydrated. Follow up as needed. No abdominal pain do not think IBS at this time.   Wrote letter to avoid barracks due to environmental allergies. She can print letter form mychart.   Marland Kitchen.Spent 30 minutes with patient and greater than 50 percent of visit spent counseling patient regarding treatment plan.   Follow up in 3 months.

## 2017-02-19 ENCOUNTER — Encounter: Payer: Self-pay | Admitting: Physician Assistant

## 2017-02-19 DIAGNOSIS — R195 Other fecal abnormalities: Secondary | ICD-10-CM

## 2017-02-19 HISTORY — DX: Other fecal abnormalities: R19.5

## 2017-03-13 ENCOUNTER — Other Ambulatory Visit: Payer: Self-pay

## 2017-03-13 DIAGNOSIS — Z6834 Body mass index (BMI) 34.0-34.9, adult: Principal | ICD-10-CM

## 2017-03-13 DIAGNOSIS — E6609 Other obesity due to excess calories: Secondary | ICD-10-CM

## 2017-03-13 MED ORDER — LORCASERIN HCL ER 20 MG PO TB24: 1.0000 | ORAL_TABLET | Freq: Every day | ORAL | 0 refills | Status: DC

## 2017-04-14 ENCOUNTER — Emergency Department (INDEPENDENT_AMBULATORY_CARE_PROVIDER_SITE_OTHER)

## 2017-04-14 ENCOUNTER — Other Ambulatory Visit: Payer: Self-pay

## 2017-04-14 ENCOUNTER — Encounter: Payer: Self-pay | Admitting: Emergency Medicine

## 2017-04-14 ENCOUNTER — Emergency Department (INDEPENDENT_AMBULATORY_CARE_PROVIDER_SITE_OTHER)
Admission: EM | Admit: 2017-04-14 | Discharge: 2017-04-15 | Disposition: A | Discharge status: Home / Self Care | Source: Home / Self Care | Attending: Family Medicine | Admitting: Family Medicine

## 2017-04-14 DIAGNOSIS — M5416 Radiculopathy, lumbar region: Secondary | ICD-10-CM

## 2017-04-14 DIAGNOSIS — M5442 Lumbago with sciatica, left side: Secondary | ICD-10-CM

## 2017-04-14 MED ORDER — TRAMADOL HCL 50 MG PO TABS: 50.0000 mg | ORAL_TABLET | Freq: Four times a day (QID) | ORAL | 0 refills | Status: DC | PRN

## 2017-04-14 MED ORDER — METHYLPREDNISOLONE ACETATE 80 MG/ML IJ SUSP
80.0000 mg | Freq: Once | INTRAMUSCULAR | Status: AC
  Administered 2017-04-14: 80 mg via INTRAMUSCULAR

## 2017-04-14 MED ORDER — KETOROLAC TROMETHAMINE 60 MG/2ML IM SOLN
60.0000 mg | Freq: Once | INTRAMUSCULAR | Status: AC
  Administered 2017-04-14: 60 mg via INTRAMUSCULAR

## 2017-04-14 NOTE — Discharge Instructions (Signed)
Apply ice pack to lower back for 20 to 30 minutes, 3 to 4 times daily  Continue until pain and swelling decrease.  May also take Tylenol two or three times daily as needed.

## 2017-04-14 NOTE — ED Provider Notes (Signed)
[name]    CSN: [id number] Arrival date & time: 04/14/17  1635     History   Chief Complaint Chief Complaint  Patient presents with  . Hip Pain    HPI Pam Gomez is a 52 y.o. female.   Four days ago after short airplane trip, patient developed left lower back pain that now radiates to her left lateral hip and thigh.  The pain is worse when walking, and improved when supine on her right side.  She recalls no injury and has had no change in her activities.   She denies bowel or bladder dysfunction, and no saddle numbness.   She has had minimal improvement after taking Aleve and Tylenol.   The history is provided by the patient.  Back Pain  Location:  Lumbar spine Quality:  Aching Radiates to:  L thigh Pain severity:  Moderate Pain is:  Same all the time Onset quality:  Sudden Duration:  4 days Timing:  Constant Progression:  Worsening Chronicity:  New Context: not lifting heavy objects and not recent injury   Relieved by:  Nothing Worsened by:  Palpation, movement, heat and ambulation Ineffective treatments:  NSAIDs, heating pad and OTC medications Associated symptoms: leg pain   Associated symptoms: no abdominal pain, no abdominal swelling, no bladder incontinence, no bowel incontinence, no dysuria, no fever, no numbness, no paresthesias, no pelvic pain, no perianal numbness, no tingling, no weakness and no weight loss   Risk factors: obesity     Past Medical History:  Diagnosis Date  . Sickle cell trait ([place]) 03/21/2015  . Systemic lupus erythematosus ([place]) 03/21/2015    Patient Active Problem List   Diagnosis Date Noted  . Change in stool caliber 02/19/2017  . History of shingles 12/10/2016  . Environmental allergies 12/10/2016  . Class 1 obesity due to excess calories without serious comorbidity with body mass index (BMI) of 34.0 to 34.9 in adult 12/10/2016  . Vitamin D insufficiency 12/10/2016  . Right knee pain 07/12/2015  . Elevated  platelet count 07/12/2015  . H/O colonoscopy 03/29/2015  . Systemic lupus erythematosus ([place]) 03/21/2015  . Sickle cell trait ([place]) 03/21/2015  . History of non anemic vitamin B12 deficiency 03/21/2015  . Chest discomfort 03/21/2015  . Chronic fatigue 03/21/2015    Past Surgical History:  Procedure Laterality Date  . CESAREAN SECTION  2002  . QUADRICEPS TENDON REPAIR  07/06/2000    OB History    No data available       Home Medications    Prior to Admission medications   Medication Sig Start Date End Date Taking? Authorizing Provider  cycloSPORINE (RESTASIS) 0.05 % ophthalmic emulsion 1 drop 2 (two) times daily.    [provider]  ergocalciferol (VITAMIN D2) 50000 units capsule Take 1 capsule (50,000 Units total) once a week by mouth. 12/10/16   Breeback, Jade L, PA-C  ibuprofen (ADVIL,MOTRIN) 800 MG tablet Take 1 tablet (800 mg total) by mouth every 8 (eight) hours as needed. 10/31/16   Long, Wonda Olds, MD  Lorcaserin HCl ER (BELVIQ XR) 20 MG TB24 Take 1 tablet by mouth daily. 03/13/17   Breeback, Royetta Car, PA-C  traMADol (ULTRAM) 50 MG tablet Take 1 tablet (50 mg total) by mouth every 6 (six) hours as needed for moderate pain. 04/14/17   Kandra Nicolas, MD    Family History Family History  Problem Relation Age of Onset  . Diabetes Other   . Hypertension Father   . Depression Paternal  Uncle   . Hypertension Paternal Uncle   . Diabetes Paternal Grandmother   . Hypertension Paternal Grandmother   . Stroke Paternal Grandmother   . Alcohol abuse Paternal Grandfather   . Hypertension Paternal Grandfather   . Heart attack Paternal Aunt     Social History Social History   Tobacco Use  . Smoking status: Never Smoker  . Smokeless tobacco: Never Used  Substance Use Topics  . Alcohol use: No    Alcohol/week: 0.0 oz  . Drug use: No     Allergies   Iodinated diagnostic agents and Penicillins   Review of Systems Review of Systems  Constitutional: Negative  for fever and weight loss.  Gastrointestinal: Negative for abdominal pain and bowel incontinence.  Genitourinary: Negative for bladder incontinence, dysuria and pelvic pain.  Musculoskeletal: Positive for back pain.  Neurological: Negative for tingling, weakness, numbness and paresthesias.  All other systems reviewed and are negative.    Physical Exam Triage Vital Signs ED Triage Vitals  Enc Vitals Group     BP 04/14/17 1805 134/76     Pulse Rate 04/14/17 1805 78     Resp 04/14/17 1805 16     Temp 04/14/17 1805 (!) 97.5 F (36.4 C)     Temp Source 04/14/17 1805 Oral     SpO2 04/14/17 1805 98 %     Weight 04/14/17 1806 180 lb (81.6 kg)     Height 04/14/17 1806 5' 3.75" (1.619 m)     Head Circumference --      Peak Flow --      Pain Score 04/14/17 1805 5     Pain Loc --      Pain Edu? --      Excl. in [place]? --    No data found.  Updated Vital Signs BP 134/76 (BP Location: Right Arm)   Pulse 78   Temp (!) 97.5 F (36.4 C) (Oral)   Resp 16   Ht 5' 3.75" (1.619 m)   Wt 180 lb (81.6 kg)   LMP 11/08/2011   SpO2 98%   BMI 31.14 kg/m   Visual Acuity Right Eye Distance:   Left Eye Distance:   Bilateral Distance:    Right Eye Near:   Left Eye Near:    Bilateral Near:     Physical Exam  Constitutional: She appears well-developed and well-nourished. No distress.  HENT:  Head: Normocephalic.  Right Ear: External ear normal.  Left Ear: External ear normal.  Nose: Nose normal.  Mouth/Throat: Oropharynx is clear and moist.  Eyes: Conjunctivae are normal. Pupils are equal, round, and reactive to light.  Neck: Normal range of motion.  Cardiovascular: Normal heart sounds.  Pulmonary/Chest: Breath sounds normal.  Abdominal: Soft. There is no tenderness.  Musculoskeletal: She exhibits no edema.       Lumbar back: She exhibits tenderness and pain. She exhibits no bony tenderness, no swelling, no edema and no spasm.       Back:       Legs: Back:   Can heel/toe walk and  squat without difficulty.  Decreased forward flexion.  Tenderness in the midline and left paraspinous muscles from mid-thoracic area to Sacral area.  Straight leg raising test is negative.  Sitting knee extension test is negative.  Strength and sensation in the lower extremities is normal.  Patellar and achilles reflexes are normal. There is distinct tenderness to palpation over the left greater trochanter. No rash present.  Neurological: She is alert.  Skin: Skin  is warm and dry. No rash noted.  Nursing note and vitals reviewed.    UC Treatments / Results  Labs (all labs ordered are listed, but only abnormal results are displayed) Labs Reviewed - No data to display  EKG  EKG Interpretation None       Radiology No results found.  Procedures Procedures (including critical care time)  Medications Ordered in UC Medications  ketorolac (TORADOL) injection 60 mg (not administered)  methylPREDNISolone acetate (DEPO-MEDROL) injection 80 mg (not administered)     Initial Impression / Assessment and Plan / UC Course  I have reviewed the triage vital signs and the nursing notes.  Pertinent labs & imaging results that were available during my care of the patient were reviewed by me and considered in my medical decision making (see chart for details).    Probably LS strain but no obvious precipitating event.  Symptoms suggestive of left sided sciatica. She may also have a component of left trochanteric bursitis. Administered Toradol 60mg  IM  Administered Depo Medrol 80mg  IM  Rx for tramadol. Controlled Substance Prescriptions I have consulted the [place] Controlled Substances Registry for this patient, and feel the risk/benefit ratio today is favorable for proceeding with this prescription for a controlled substance.   Apply ice pack to lower back for 20 to 30 minutes, 3 to 4 times daily  Continue until pain and swelling decrease.  May also take Tylenol two or three times daily as  needed. Followup with Dr. Aundria Mems or Dr. Lynne Leader ([facility]) as soon as possible for follow-up.    Final Clinical Impressions(s) / UC Diagnoses   Final diagnoses:  Acute left-sided low back pain with left-sided sciatica    ED Discharge Orders        Ordered    traMADol (ULTRAM) 50 MG tablet  Every 6 hours PRN     04/14/17 2022         Kandra Nicolas, MD 04/15/17 1136

## 2017-04-14 NOTE — ED Triage Notes (Signed)
Patient noticed sudden onset of pain in left hip/lateral back 3 days ago while [place] airport; now difficult/painful to walk with constant pain and occasional stabbing sensations. No OTCs today.

## 2017-04-19 ENCOUNTER — Telehealth: Payer: Self-pay | Admitting: Physician Assistant

## 2017-04-19 ENCOUNTER — Encounter: Payer: Self-pay | Admitting: Physician Assistant

## 2017-04-19 ENCOUNTER — Ambulatory Visit (INDEPENDENT_AMBULATORY_CARE_PROVIDER_SITE_OTHER): Admitting: Physician Assistant

## 2017-04-19 ENCOUNTER — Ambulatory Visit (INDEPENDENT_AMBULATORY_CARE_PROVIDER_SITE_OTHER): Admitting: Family Medicine

## 2017-04-19 ENCOUNTER — Encounter: Payer: Self-pay | Admitting: Family Medicine

## 2017-04-19 VITALS — BP 114/58 | HR 86 | Ht 63.0 in | Wt 188.0 lb

## 2017-04-19 VITALS — BP 114/58 | HR 86 | Ht 63.75 in | Wt 188.0 lb

## 2017-04-19 DIAGNOSIS — M7062 Trochanteric bursitis, left hip: Secondary | ICD-10-CM

## 2017-04-19 DIAGNOSIS — S39012A Strain of muscle, fascia and tendon of lower back, initial encounter: Secondary | ICD-10-CM | POA: Diagnosis not present

## 2017-04-19 DIAGNOSIS — Z6833 Body mass index (BMI) 33.0-33.9, adult: Secondary | ICD-10-CM | POA: Diagnosis not present

## 2017-04-19 DIAGNOSIS — Z9109 Other allergy status, other than to drugs and biological substances: Secondary | ICD-10-CM

## 2017-04-19 DIAGNOSIS — E6609 Other obesity due to excess calories: Secondary | ICD-10-CM

## 2017-04-19 MED ORDER — CYCLOBENZAPRINE HCL 10 MG PO TABS: 5.0000 mg | ORAL_TABLET | Freq: Three times a day (TID) | ORAL | 0 refills | Status: DC | PRN

## 2017-04-19 MED ORDER — OMEPRAZOLE 40 MG PO CPDR
40.0000 mg | DELAYED_RELEASE_CAPSULE | Freq: Every day | ORAL | 0 refills | Status: DC
Start: 2017-04-19 — End: 2018-07-28

## 2017-04-19 NOTE — Patient Instructions (Signed)
Thank you for coming in today. Attend PT.  Use a TENS unit.  Use muscle relaxer at bedtime as needed.  Continue Aleve 2 pills twice daily as needed.  Recheck with me if not doing well.   Come back or go to the emergency room if you notice new weakness new numbness problems walking or bowel or bladder problems.  TENS UNIT: This is helpful for muscle pain and spasm.   Search and Purchase a TENS 7000 2nd edition at  www.tenspros.com or www.[employer].com It should be less than $30.     TENS unit instructions: Do not shower or bathe with the unit on Turn the unit off before removing electrodes or batteries If the electrodes lose stickiness add a drop of water to the electrodes after they are disconnected from the unit and place on plastic sheet. If you continued to have difficulty, call the TENS unit company to purchase more electrodes. Do not apply lotion on the skin area prior to use. Make sure the skin is clean and dry as this will help prolong the life of the electrodes. After use, always check skin for unusual red areas, rash or other skin difficulties. If there are any skin problems, does not apply electrodes to the same area. Never remove the electrodes from the unit by pulling the wires. Do not use the TENS unit or electrodes other than as directed. Do not change electrode placement without consultating your therapist or physician. Keep 2 fingers with between each electrode. Wear time ratio is 2:1, on to off times.    For example on for 30 minutes off for 15 minutes and then on for 30 minutes off for 15 minutes      Lumbosacral Strain Lumbosacral strain is an injury that causes pain in the lower back (lumbosacral spine). This injury usually occurs from overstretching the muscles or ligaments along your spine. A strain can affect one or more muscles or cord-like tissues that connect bones to other bones (ligaments). What are the causes? This condition may be caused by:  A hard,  direct hit (blow) to the back.  Excessive stretching of the lower back muscles. This may result from: ? A fall. ? Lifting something heavy. ? Repetitive movements such as bending or crouching.  What increases the risk? The following factors may increase your risk of getting this condition:  Participating in sports or activities that involve: ? A sudden twist of the back. ? Pushing or pulling motions.  Being overweight or obese.  Having poor strength and flexibility, especially tight hamstrings or weak muscles in the back or abdomen.  Having too much of a curve in the lower back.  Having a pelvis that is tilted forward.  What are the signs or symptoms? The main symptom of this condition is pain in the lower back, at the site of the strain. Pain may extend (radiate) down one or both legs. How is this diagnosed? This condition is diagnosed based on:  Your symptoms.  Your medical history.  A physical exam. ? Your health care provider may push on certain areas of your back to determine the source of your pain. ? You may be asked to bend forward, backward, and side to side to assess the severity of your pain and your range of motion.  Imaging tests, such as: ? X-rays. ? MRI.  How is this treated? Treatment for this condition may include:  Putting heat and cold on the affected area.  Medicines to help relieve pain  and relax your muscles (muscle relaxants).  NSAIDs to help reduce swelling and discomfort.  When your symptoms improve, it is important to gradually return to your normal routine as soon as possible to reduce pain, avoid stiffness, and avoid loss of muscle strength. Generally, symptoms should improve within 6 weeks of treatment. However, recovery time varies. Follow these instructions at home: Managing pain, stiffness, and swelling   If directed, put ice on the injured area during the first 24 hours after your strain. ? Put ice in a plastic bag. ? Place a  towel between your skin and the bag. ? Leave the ice on for 20 minutes, 2-3 times a day.  If directed, put heat on the affected area as often as told by your health care provider. Use the heat source that your health care provider recommends, such as a moist heat pack or a heating pad. ? Place a towel between your skin and the heat source. ? Leave the heat on for 20-30 minutes. ? Remove the heat if your skin turns bright red. This is especially important if you are unable to feel pain, heat, or cold. You may have a greater risk of getting burned. Activity  Rest and return to your normal activities as told by your health care provider. Ask your health care provider what activities are safe for you.  Avoid activities that take a lot of energy for as long as told by your health care provider. General instructions  Take over-the-counter and prescription medicines only as told by your health care provider.  Donot drive or use heavy machinery while taking prescription pain medicine.  Do not use any products that contain nicotine or tobacco, such as cigarettes and e-cigarettes. If you need help quitting, ask your health care provider.  Keep all follow-up visits as told by your health care provider. This is important. How is this prevented?  Use correct form when playing sports and lifting heavy objects.  Use good posture when sitting and standing.  Maintain a healthy weight.  Sleep on a mattress with medium firmness to support your back.  Be safe and responsible while being active to avoid falls.  Do at least 150 minutes of moderate-intensity exercise each week, such as brisk walking or water aerobics. Try a form of exercise that takes stress off your back, such as swimming or stationary cycling.  Maintain physical fitness, including: ? Strength. ? Flexibility. ? Cardiovascular fitness. ? Endurance. Contact a health care provider if:  Your back pain does not improve after 6 weeks  of treatment.  Your symptoms get worse. Get help right away if:  Your back pain is severe.  You cannot stand or walk.  You have difficulty controlling when you urinate or when you have a bowel movement.  You feel nauseous or you vomit.  Your feet get very cold.  You have numbness, tingling, weakness, or problems using your arms or legs.  You develop any of the following: ? Shortness of breath. ? Dizziness. ? Pain in your legs. ? Weakness in your buttocks or legs. ? Discoloration of the skin on your toes or legs. This information is not intended to replace advice given to you by your health care provider. Make sure you discuss any questions you have with your health care provider. Document Released: 10/25/2004 Document Revised: 08/05/2015 Document Reviewed: 06/19/2015 Elsevier Interactive Patient Education  2018 [place].   Hip Bursitis Hip bursitis is inflammation of a fluid-filled sac (bursa) in the hip joint.  The bursa protects the bones in the hip joint from rubbing against each other. Hip bursitis can cause mild to moderate pain, and symptoms often come and go over time. What are the causes? This condition may be caused by:  Injury to the hip.  Overuse of the muscles that surround the hip joint.  Arthritis or gout.  Diabetes.  Thyroid disease.  Cold weather.  Infection.  In some cases, the cause may not be known. What are the signs or symptoms? Symptoms of this condition may include:  Mild or moderate pain in the hip area. Pain may get worse with movement.  Tenderness and swelling of the hip, especially on the outer side of the hip.  Symptoms may come and go. If the bursa becomes infected, you may have the following symptoms:  Fever.  Red skin and a feeling of warmth in the hip area.  How is this diagnosed? This condition may be diagnosed based on:  A physical exam.  Your medical history.  X-rays.  Removal of fluid from your inflamed  bursa for testing (biopsy).  You may be sent to a health care provider who specializes in bone diseases (orthopedist) or a provider who specializes in joint inflammation (rheumatologist). How is this treated? This condition is treated by resting, raising (elevating), and applying pressure(compression) to the injured area. In some cases, this may be enough to make your symptoms go away. Treatment may also include:  Crutches.  Antibiotic medicine.  Draining fluid out of the bursa to help relieve swelling.  Injecting medicine that helps to reduce inflammation (cortisone).  Follow these instructions at home: Medicines  Take over-the-counter and prescription medicines only as told by your health care provider.  Do not drive or operate heavy machinery while taking prescription pain medicine, or as told by your health care provider.  If you were prescribed an antibiotic, take it as told by your health care provider. Do not stop taking the antibiotic even if you start to feel better. Activity  Return to your normal activities as told by your health care provider. Ask your health care provider what activities are safe for you.  Rest and protect your hip as much as possible until your pain and swelling get better. General instructions  Wear compression wraps only as told by your health care provider.  Elevate your hip above the level of your heart as much as you can without pain. To do this, try putting a pillow under your hips while you lie down.  Do not use your hip to support your body weight until your health care provider says that you can. Use crutches as told by your health care provider.  Gently massage and stretch your injured area as often as is comfortable.  Keep all follow-up visits as told by your health care provider. This is important. How is this prevented?  Exercise regularly, as told by your health care provider.  Warm up and stretch before being active.  Cool  down and stretch after being active.  If an activity irritates your hip or causes pain, avoid the activity as much as possible.  Avoid sitting down for long periods at a time. Contact a health care provider if:  You have a fever.  You develop new symptoms.  You have difficulty walking or doing everyday activities.  You have pain that gets worse or does not get better with medicine.  You develop red skin or a feeling of warmth in your hip area. Get help  right away if:  You cannot move your hip.  You have severe pain. This information is not intended to replace advice given to you by your health care provider. Make sure you discuss any questions you have with your health care provider. Document Released: 07/07/2001 Document Revised: 06/23/2015 Document Reviewed: 08/17/2014 Elsevier Interactive Patient Education  [employer].

## 2017-04-19 NOTE — Progress Notes (Signed)
Pam Gomez is a 52 y.o. female who presents to [facility] today for back pain and lateral hip pain.  [name] has a history of back pain ongoing now for about 8 days.  She denies any injury but notes the pain started after she was moving her back around with a domestic flight.  She notes the pain is located in the left lower back and left lateral hip.  She is been having occasional episodes of spasm that have woken her from sleep.  The pain does not radiate down her leg but has had spasm and pain upper thoracic back bilaterally.  She denies any chest pain or palpations or trouble breathing.  She denies any abdominal pain vomiting diarrhea or urinary symptoms.  She denies any radiating weakness or numbness bowel or bladder dysfunction or difficulty walking.  She is tried Advil which helps only a little.  She is tried heating pad which helps a bit as well. She notes that she is currently in the [employer] and has a physical fitness assessment on April 6 that she will not be able to complete and requests a note for that.   Past Medical History:  Diagnosis Date  . Sickle cell trait ([place]) 03/21/2015  . Systemic lupus erythematosus ([place]) 03/21/2015   Past Surgical History:  Procedure Laterality Date  . CESAREAN SECTION  2002  . QUADRICEPS TENDON REPAIR  07/06/2000   Social History   Tobacco Use  . Smoking status: Never Smoker  . Smokeless tobacco: Never Used  Substance Use Topics  . Alcohol use: No    Alcohol/week: 0.0 oz     ROS:  As above   Medications: Current Outpatient Medications  Medication Sig Dispense Refill  . cycloSPORINE (RESTASIS) 0.05 % ophthalmic emulsion 1 drop 2 (two) times daily.    . ergocalciferol (VITAMIN D2) 50000 units capsule Take 1 capsule (50,000 Units total) once a week by mouth. 12 capsule 0  . ibuprofen (ADVIL,MOTRIN) 800 MG tablet Take 1 tablet (800 mg total) by mouth every 8 (eight) hours as needed. 21 tablet  0  . Lorcaserin HCl ER (BELVIQ XR) 20 MG TB24 Take 1 tablet by mouth daily. 90 tablet 0  . traMADol (ULTRAM) 50 MG tablet Take 1 tablet (50 mg total) by mouth every 6 (six) hours as needed for moderate pain. 15 tablet 0  . cetirizine (ZYRTEC) 10 MG tablet Take 10 mg by mouth daily.    . cyclobenzaprine (FLEXERIL) 10 MG tablet Take 0.5-1 tablets (5-10 mg total) by mouth 3 (three) times daily as needed for muscle spasms. 30 tablet 0  . fluticasone (FLONASE) 50 MCG/ACT nasal spray Place into both nostrils daily.    Marland Kitchen omeprazole (PRILOSEC) 40 MG capsule Take 1 capsule (40 mg total) by mouth daily. 30 capsule 0   No current facility-administered medications for this visit.    Allergies  Allergen Reactions  . Iodinated Diagnostic Agents Nausea And Vomiting  . Penicillins Hives     Exam:  BP (!) 114/58   Pulse 86   Ht 5\' 3"  (1.6 m)   Wt 188 lb (85.3 kg)   LMP 11/08/2011   BMI 33.30 kg/m  General: Well Developed, well nourished, and in no acute distress.  Neuro/Psych: Alert and oriented x3, extra-ocular muscles intact, able to move all 4 extremities, sensation grossly intact. Skin: Warm and dry, no rashes noted.  Respiratory: Not using accessory muscles, speaking in full sentences, trachea midline.  Cardiovascular: Pulses  palpable, no extremity edema. Abdomen: Does not appear distended. MSK:  L-spine: Nontender to spinal midline.  Tender to palpation left SI joint region and lumbar paraspinal muscle group. Range of motion: Limited extension.  Mildly limited rotation and lateral flexion with pain with left rotation and left lateral flexion. Lower extremity strength is intact throughout except as documented below in the hip exam. Reflexes are diminished but equal bilateral lower extremities. Sensation is intact throughout.  Left hip: Normal-appearing normal motion. Tender to palpation greater trochanter.  Hip abduction strength is diminished with pain 3+/5 Right hip: Normal-appearing  normal motion Nontender to palpation.  Hip abduction strength is diminished 4+/5 without pain.   Mild antalgic gait.   EXAM: LUMBAR SPINE - COMPLETE 4+ VIEW  COMPARISON:  Chest radiographs 07/26/2014.  FINDINGS: Normal lumbar segmentation. Straightening of lordosis but otherwise normal lumbar vertebral height and alignment. No pars fracture. Relatively preserved disc spaces. Sacral ala and SI joints appear normal. Visible lower thoracic levels appear intact. Visible pelvis appears intact. Negative visible bowel gas pattern.  IMPRESSION: Straightening of lumbar lordosis. Otherwise negative for age radiographic appearance of lumbar spine.   Electronically Signed   By: Genevie Ann M.D.   On: 04/14/2017 20:57 I personally (independently) visualized and performed the interpretation of the images attached in this note.    Assessment and Plan: 52 y.o. female with  Right low back pain and lateral hip pain is very likely lumbosacral myofascial strain and spasm and right hip trochanteric bursitis versus hip abductor tendinitis.  These issues are interconnected and fundamentally due to core muscular weakness.  Plan for trial of dedicated physical therapy.  Will also continue treatment with heating pad TENS unit, and prescription strength NSAIDs.  Will prescribe Flexeril for spasm especially at bedtime and will also prescribe omeprazole for GI prophylaxis with NSAIDs.  Note for [employer] fitness assessment provided.  Recheck in 1 month for reassessment about preparedness for fitness test.   Orders Placed This Encounter  Procedures  . Ambulatory referral to Physical Therapy    Referral Priority:   Routine    Referral Type:   Physical Medicine    Referral Reason:   Specialty Services Required    Requested Specialty:   Physical Therapy   Meds ordered this encounter  Medications  . cyclobenzaprine (FLEXERIL) 10 MG tablet    Sig: Take 0.5-1 tablets (5-10 mg total) by mouth 3 (three)  times daily as needed for muscle spasms.    Dispense:  30 tablet    Refill:  0  . omeprazole (PRILOSEC) 40 MG capsule    Sig: Take 1 capsule (40 mg total) by mouth daily.    Dispense:  30 capsule    Refill:  0    Discussed warning signs or symptoms. Please see discharge instructions. Patient expresses understanding.

## 2017-04-19 NOTE — Progress Notes (Signed)
Subjective:    Patient ID: Pam Gomez, female    DOB: 09-07-1965, 52 y.o.   MRN: [id number]  HPI  Pt is a 52 yo female who presents to the clinic to follow up on environmental allergies and weight.   She is in the [employer] and needs a note written so that she does not have to stay in [place] at [facility]. She has been seen by 2 allergist who have confirmed a mold allergy that is exacerbated by environment at [facility]. She taking zyrtec, flonase, and a mold formula for prevention.   She has not yet started belviq. Per patient she states they are waiting on PA. She has not tried any other medications. She has tried diet and exercise for over a year. She was seen once by nutiritionist. She has tried weight watchers, keto diet and other online plans. She has had very little results.   .. Active Ambulatory Problems    Diagnosis Date Noted  . Systemic lupus erythematosus (Pam Gomez) 03/21/2015  . Sickle cell trait (Pam Gomez) 03/21/2015  . History of non anemic vitamin B12 deficiency 03/21/2015  . Chest discomfort 03/21/2015  . Chronic fatigue 03/21/2015  . H/O colonoscopy 03/29/2015  . Right knee pain 07/12/2015  . Elevated platelet count 07/12/2015  . History of shingles 12/10/2016  . Environmental allergies 12/10/2016  . Class 1 obesity due to excess calories without serious comorbidity with body mass index (BMI) of 34.0 to 34.9 in adult 12/10/2016  . Vitamin D insufficiency 12/10/2016  . Change in stool caliber 02/19/2017   Resolved Ambulatory Problems    Diagnosis Date Noted  . No Resolved Ambulatory Problems   Past Medical History:  Diagnosis Date  . Sickle cell trait (Pam Gomez) 03/21/2015  . Systemic lupus erythematosus (Pam Gomez) 03/21/2015      Review of Systems  All other systems reviewed and are negative.      Objective:   Physical Exam  Constitutional: She is oriented to person, place, and time. She appears well-developed and well-nourished.  HENT:  Head: Normocephalic and  atraumatic.  Cardiovascular: Normal rate, regular rhythm and normal heart sounds.  Pulmonary/Chest: Effort normal and breath sounds normal.  Neurological: She is alert and oriented to person, place, and time.  Psychiatric: She has a normal mood and affect. Her behavior is normal.          Assessment & Plan:  Marland KitchenMarland KitchenRashi was seen today for asthma and obesity.  Diagnoses and all orders for this visit:  Environmental allergies  Class 1 obesity due to excess calories without serious comorbidity with body mass index (BMI) of 33.0 to 33.9 in adult   I will write letter. She is waiting to give me specific information about the first allergist she saw to complete the note.   Will send note to [facility], who handles PA authorization, to see what is going on with belviq. See HPI for what she has tried. BMI 33.3.  Marland Kitchen.Discussed low carb diet with 1500 calories and 80g of protein.  Exercising at least 150 minutes a week.  [employer] could be a Microbiologist.  Follow up 1 month after belviq start.     To whom it may concern:   The above patient is under my care along with her allergist Dr. Brock Ra. It is under our medical opinion that we recommend patient to not be required to stay in the barracks at [facility]. We believe that exposure to mold can exacerbate her ongoing conditions. Pt has  confirmed environmental allergies through testingand when she stays on site in barracks her symptoms worsen. Please allow her to stay off site.

## 2017-04-19 NOTE — Telephone Encounter (Signed)
Per patient at todays appt she states she has not heard anything the PA on Belviq. She is wondering about status. If not can we create a new one.

## 2017-04-19 NOTE — Telephone Encounter (Signed)
Approvedtoday  CaseId:[id number];Status:Approved;Review Type:Prior Auth;Coverage Start Date:03/20/2017;Coverage End Date:08/17/2017; Patient is aware.

## 2017-05-07 ENCOUNTER — Encounter: Payer: Self-pay | Admitting: Physician Assistant

## 2017-05-13 ENCOUNTER — Encounter: Payer: Self-pay | Admitting: Physical Therapy

## 2017-05-13 ENCOUNTER — Ambulatory Visit (INDEPENDENT_AMBULATORY_CARE_PROVIDER_SITE_OTHER): Admitting: Physical Therapy

## 2017-05-13 DIAGNOSIS — M545 Low back pain, unspecified: Secondary | ICD-10-CM

## 2017-05-13 DIAGNOSIS — M62830 Muscle spasm of back: Secondary | ICD-10-CM

## 2017-05-13 DIAGNOSIS — M25552 Pain in left hip: Secondary | ICD-10-CM | POA: Diagnosis not present

## 2017-05-13 NOTE — Therapy (Signed)
[facility] [place] [address] [place], [place], [address] Phone: [phone]   Fax:  [phone]  Physical Therapy Evaluation  Patient Details  Name: Pam Gomez MRN: [id number] Date of Birth: 1965/05/28 Referring Provider: Dr Lynne Leader   Encounter Date: 05/13/2017  PT End of Session - 05/13/17 1601    Visit Number  1    Number of Visits  12    Date for PT Re-Evaluation  06/24/17    PT Start Time  1603    PT Stop Time  1704    PT Time Calculation (min)  61 min    Activity Tolerance  Patient tolerated treatment well       Past Medical History:  Diagnosis Date  . Sickle cell trait ([place]) 03/21/2015  . Systemic lupus erythematosus ([place]) 03/21/2015    Past Surgical History:  Procedure Laterality Date  . CESAREAN SECTION  2002  . QUADRICEPS TENDON REPAIR  07/06/2000    There were no vitals filed for this visit.   Subjective Assessment - 05/13/17 1602    Subjective  Pt reports she was moving a bag on a flight about a month ago and felt a twinge in her low back. She has been alternating between ice/heat, waiting on a TENs unit, husband is performing gentle back massage and slow stretches.     Currently in Pain?  No/denies has intermittent back spasms    Pain Orientation  Left    Pain Descriptors / Indicators  Tightness    Pain Radiating Towards  and moves up her back to between the shoulder blades    Pain Onset  1 to 4 weeks ago    Pain Frequency  Intermittent    Aggravating Factors   poor posture, over do it    Pain Relieving Factors  heat/ice         [facility] PT Assessment - 05/13/17 0001      Assessment   Medical Diagnosis  lumbosacral strain & Lt hip bursitis    Referring Provider  Dr Lynne Leader    Onset Date/Surgical Date  04/12/17    Hand Dominance  Right    Prior Therapy  not for this      Precautions   Precautions  None      Balance Screen   Has the patient fallen in the past 6 months  No      Prior Function   Level of Independence  Independent    Vocation  Full time employment    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      Observation/Other Assessments   Focus on Therapeutic Outcomes (FOTO)   44% limited      Functional Tests   Functional tests  Squat      Squat   Comments  Knee pain,      Posture/Postural Control   Posture/Postural Control  Postural limitations    Postural Limitations  Forward head;Rounded Shoulders obesity      ROM / Strength   AROM / PROM / Strength  AROM;Strength      AROM   AROM Assessment Site  Lumbar    Lumbar Flexion  to floor    Lumbar Extension  WNL, with pain    Lumbar - Right Side Bend  WNL    Lumbar - Left Side Bend  75% present with pain    Lumbar - Right Rotation  WNL slight pain    Lumbar - Left Rotation  WNL with pain  Strength   Strength Assessment Site  Hip;Knee;Ankle;Lumbar    Right/Left Hip  -- Rt WNL, Lt grossly 4+/5    Right/Left Knee  -- WFL    Right/Left Ankle  -- [facility]    Lumbar Flexion  -- TA fair    Lumbar Extension  -- lumbar multifidi good (-)       Flexibility   Soft Tissue Assessment /Muscle Length  yes    Hamstrings  WNL    Quadriceps  prone knee flex to ~ 110 bilat    Piriformis  tight Lt side      Palpation   Palpation comment  tight and tender in Lt gluts and piriformis and bilat upper lumbar and lower thoracic paraspinals                 Objective measurements completed on examination: See above findings.      [place] Adult PT Treatment/Exercise - 05/13/17 0001      Exercises   Exercises  Lumbar      Lumbar Exercises: Stretches   Quadruped Mid Back Stretch  -- 10 reps cat cow, vc for form    Prone Mid Back Stretch  -- childs pose with stretch to each side.       Lumbar Exercises: Standing   Other Standing Lumbar Exercises  self TPR with ball against a wall to Lt buttocks and low back.       Lumbar Exercises: Supine   Other Supine Lumbar Exercises  over pool noodle long  ways and cross for releases      Modalities   Modalities  Electrical Stimulation;Moist Heat      Moist Heat Therapy   Number Minutes Moist Heat  15 Minutes    Moist Heat Location  Lumbar Spine & buttocks      Electrical Stimulation   Electrical Stimulation Location  lumbar and Lt buttocks    Electrical Stimulation Action  IFC    Electrical Stimulation Parameters  to tolerance    Electrical Stimulation Goals  Tone;Pain             PT Education - 05/13/17 1652    Education provided  Yes    Education Details  HEP    Person(s) Educated  Patient    Methods  Explanation;Handout    Comprehension  Returned demonstration;Verbalized understanding          PT Long Term Goals - 05/13/17 1600      PT LONG TERM GOAL #1   Title  I with advanced HEP (05/30/17)     Time  6    Period  Weeks    Status  New      PT LONG TERM GOAL #2   Title  demo painfree lumbar ROM ( 06/24/17)     Time  6    Period  Weeks    Status  New      PT LONG TERM GOAL #3   Title  improve FOTO -/< 33% limited ( 06/24/17)     Time  6    Period  Weeks    Status  On-going      PT LONG TERM GOAL #4   Title  sleep per her previous level without waking due to back spasms ( 06/24/17)     Time  6    Period  Weeks    Status  New      PT LONG TERM GOAL #5   Title  --    Period  --  Status  --             Plan - 05/13/17 1652    Clinical Impression Statement  52 yo female with ~ 1 month h/o low back pain after moving a bag on a flight.  Her pain starts in her buttocks and travels up the back mostly on the left side.  She does have muscular tightness.  Overall her strenght and motion are good however she has pain .     Clinical Presentation  Stable    Clinical Decision Making  Low    Rehab Potential  Good    PT Frequency  2x / week    PT Duration  6 weeks    PT Treatment/Interventions  Iontophoresis 4mg /ml Dexamethasone;Dry needling;Manual techniques;Moist Heat;Traction;Ultrasound;Therapeutic  activities;Patient/family education;Taping;Therapeutic exercise;Cryotherapy;Electrical Stimulation    PT Next Visit Plan  manual work low back and buttocks Lt, gentle stretching andmovement for body.  She will be out of town next week for [employer] and return after this.     Consulted and Agree with Plan of Care  Patient       Patient will benefit from skilled therapeutic intervention in order to improve the following deficits and impairments:  Pain, Increased muscle spasms, Obesity  Visit Diagnosis: Acute bilateral low back pain without sciatica - Plan: PT plan of care cert/re-cert  Pain in left hip - Plan: PT plan of care cert/re-cert  Muscle spasm of back - Plan: PT plan of care cert/re-cert     Problem List Patient Active Problem List   Diagnosis Date Noted  . Change in stool caliber 02/19/2017  . History of shingles 12/10/2016  . Environmental allergies 12/10/2016  . Class 1 obesity due to excess calories without serious comorbidity with body mass index (BMI) of 34.0 to 34.9 in adult 12/10/2016  . Vitamin D insufficiency 12/10/2016  . Right knee pain 07/12/2015  . Elevated platelet count 07/12/2015  . H/O colonoscopy 03/29/2015  . Systemic lupus erythematosus ([place]) 03/21/2015  . Sickle cell trait ([place]) 03/21/2015  . History of non anemic vitamin B12 deficiency 03/21/2015  . Chest discomfort 03/21/2015  . Chronic fatigue 03/21/2015    Jeral Pinch PT  05/13/2017, 5:01 PM  [facility] [place], [place], [address] Phone: [phone]   Fax:  [phone]  Name: Pam Gomez MRN: [id number] Date of Birth: 06/08/1965

## 2017-05-13 NOTE — Patient Instructions (Addendum)
Cat / Cow Flow    Inhale, press spine toward ceiling like a Halloween cat. Keeping strength in arms and abdominals, exhale to soften spine through neutral and into cow pose. Open chest and arch back. Initiate movement between cat and cow at tailbone, one vertebrae at a time. Repeat __10__ times. Hold 1-2 sec at each end range. Twice a day.   BACK: Child's Pose (Sciatica)    Sit in knee-chest position, with knees wide and reach arms forward. Separate knees for comfort. Hold position for _30-45__ secs. Repeat _1__ times. Do __2_ times per day. Can then walk hands side to side.   Thoracic Self-Mobilization Stretch (Supine) - straighten legs for deeper stretch.     With small rolled towel at lower ribs level, gently lie back until stretch is felt. Hold __60-120__ seconds. Relax. Repeat __1__ times per set. Do _1_ sets per session. Do __2__ sessions per day. Scoot down and rest again at different levels of your spine.     With rolled towel placed lengthwise at lower ribs level, lie back on towel with arms outstretched. Hold _60-120_ seconds. Relax. Repeat _1 times per set. Do _1__ sets per session. Do _2_ sessions per day.  SELF TRIGGER POINT RELEASE: Use a ball against a wall and roll out the tender areas.  Can also perform this lying down.   Work on walking with gentle arm swing to get the muscles moving.

## 2017-05-27 ENCOUNTER — Encounter: Payer: Self-pay | Admitting: Physical Therapy

## 2017-05-27 ENCOUNTER — Ambulatory Visit (INDEPENDENT_AMBULATORY_CARE_PROVIDER_SITE_OTHER): Admitting: Physical Therapy

## 2017-05-27 DIAGNOSIS — M545 Low back pain, unspecified: Secondary | ICD-10-CM

## 2017-05-27 DIAGNOSIS — M62830 Muscle spasm of back: Secondary | ICD-10-CM | POA: Diagnosis not present

## 2017-05-27 DIAGNOSIS — M25552 Pain in left hip: Secondary | ICD-10-CM

## 2017-05-27 NOTE — Therapy (Signed)
[place], [place], [address] Phone: [phone]   Fax:  [phone]  Physical Therapy Treatment  Patient Details  Name: Pam Gomez MRN: [id number] Date of Birth: [birth year] Referring Provider: Dr Lynne Leader   Encounter Date: 05/27/2017  PT End of Session - 05/27/17 1606    Visit Number  2    Number of Visits  12    Date for PT Re-Evaluation  06/24/17    PT Start Time  2595    PT Stop Time  1701    PT Time Calculation (min)  55 min    Activity Tolerance  Patient tolerated treatment well       Past Medical History:  Diagnosis Date  . Sickle cell trait ([place]) 03/21/2015  . Systemic lupus erythematosus ([place]) 03/21/2015    Past Surgical History:  Procedure Laterality Date  . CESAREAN SECTION  2002  . QUADRICEPS TENDON REPAIR  07/06/2000    There were no vitals filed for this visit.  Subjective Assessment - 05/27/17 1606    Subjective  Pam Gomez reports she is having some tightness, She did her HEP while she was away. 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     Patient is accompained by:  Family member daughter    Currently in Pain?  Yes    Pain Score  5     Pain Location  Back into thoracic area    Pain Orientation  Left;Right    Pain Descriptors / Indicators  Tightness    Pain Type  Acute pain    Pain Onset  More than a month ago    Pain Frequency  Intermittent    Aggravating Factors   travel    Pain Relieving Factors  heat and stretches         [facility] PT Assessment - 05/27/17 0001      Assessment   Medical Diagnosis  lumbosacral strain & Lt hip bursitis      Strength   Right/Left Hip  -- bilat hip ext 5/5                   [facility] Adult PT Treatment/Exercise - 05/27/17 0001      Lumbar Exercises: Stretches   Passive Hamstring Stretch  Left;Right;60 seconds      Lumbar Exercises: Aerobic   Stationary Bike  L2x5'      Lumbar Exercises: Supine    Other Supine Lumbar Exercises  TA contraction then lift legs into table top 10 x 5sec      Lumbar Exercises: Prone   Opposite Arm/Leg Raise  Left arm/Right leg;Right arm/Left leg;10 reps    Other Prone Lumbar Exercises  pelvic press with 10 reps knee flex/ext VC to keep press       Modalities   Modalities  Electrical Stimulation;Moist Heat      Moist Heat Therapy   Number Minutes Moist Heat  15 Minutes    Moist Heat Location  -- lower thoracic and lumbar paraspinals      Electrical Stimulation   Electrical Stimulation Location  thoracic and lumbar paraspinals    Electrical Stimulation Action  IFC    Electrical Stimulation Parameters  to tolerance    Electrical Stimulation Goals  Tone;Pain      Manual Therapy   Manual Therapy  Soft tissue mobilization    Soft tissue mobilization  STM to thoracic and lumbar paraspinals with focus on Rt rhombiods and thoracic paraspinals  PT Long Term Goals - 05/27/17 1617      PT LONG TERM GOAL #1   Title  I with advanced HEP (05/30/17)     Status  On-going      PT LONG TERM GOAL #2   Title  demo painfree lumbar ROM ( 06/24/17)     Status  On-going      PT LONG TERM GOAL #3   Title  improve FOTO -/< 33% limited ( 06/24/17)     Status  On-going      PT LONG TERM GOAL #4   Title  sleep per her previous level without waking due to back spasms ( 06/24/17)     Status  On-going only has spasms one night while out of town.       PT LONG TERM GOAL #5   Title  improve FOTO =/< 27% limited 11/22/16    Status  On-going            Plan - 05/27/17 1653    Clinical Impression Statement  Pam Gomez presented with tightness in her thoracic and upper lumbar paraspinals, Rt > Lt.  This is a little different spot from two weeks ago.  She feels the traveling and pushing a suitcase through the airport attributes to this. She is sleeping better and having less muscle spasms.  It is only her second visit, no goals met at this time.   Reported decreased tightness after session.     Rehab Potential  Good    PT Frequency  2x / week    PT Duration  6 weeks    PT Treatment/Interventions  Iontophoresis 66m/ml Dexamethasone;Dry needling;Manual techniques;Moist Heat;Traction;Ultrasound;Therapeutic activities;Patient/family education;Taping;Therapeutic exercise;Cryotherapy;Electrical Stimulation    PT Next Visit Plan  progress HEP , thoracic openers. manual work and modalities PRN    Consulted and Agree with Plan of Care  Patient       Patient will benefit from skilled therapeutic intervention in order to improve the following deficits and impairments:     Visit Diagnosis: Acute bilateral low back pain without sciatica  Pain in left hip  Muscle spasm of back     Problem List Patient Active Problem List   Diagnosis Date Noted  . Change in stool caliber 02/19/2017  . History of shingles 12/10/2016  . Environmental allergies 12/10/2016  . Class 1 obesity due to excess calories without serious comorbidity with body mass index (BMI) of 34.0 to 34.9 in adult 12/10/2016  . Vitamin D insufficiency 12/10/2016  . Right knee pain 07/12/2015  . Elevated platelet count 07/12/2015  . H/O colonoscopy 03/29/2015  . [place] 03/21/2015  . [place] cell [place] ([place] 03/21/2015  . History of non anemic vitamin B12 deficiency 03/21/2015  . Chest discomfort 03/21/2015  . Chronic fatigue 03/21/2015    SJeral PinchPT  05/27/2017, 4:56 PM  [facility] [address]Phone: [phone]  Fax:  [phone] Name: Pam OuttenMRN: [id number] of Birth: 5January 05, 1967

## 2017-05-30 ENCOUNTER — Ambulatory Visit (INDEPENDENT_AMBULATORY_CARE_PROVIDER_SITE_OTHER): Admitting: Rehabilitative and Restorative Service Providers"

## 2017-05-30 DIAGNOSIS — M25552 Pain in left hip: Secondary | ICD-10-CM

## 2017-05-30 DIAGNOSIS — M62830 Muscle spasm of back: Secondary | ICD-10-CM | POA: Diagnosis not present

## 2017-05-30 DIAGNOSIS — M545 Low back pain, unspecified: Secondary | ICD-10-CM

## 2017-05-30 NOTE — Therapy (Signed)
[place], [place], [address] Phone: [phone]   Fax:  [phone]  Physical Therapy Treatment  Patient Details  Name: Pam Gomez MRN: [id number] Date of Birth: 20-Feb-1965 Referring Provider: Dr Lynne Leader   Encounter Date: 05/30/2017  PT End of Session - 05/30/17 1626    Visit Number  3    Number of Visits  12    Date for PT Re-Evaluation  06/24/17    PT Start Time  1622    PT Stop Time  1719    PT Time Calculation (min)  57 min    Activity Tolerance  Patient tolerated treatment well       Past Medical History:  Diagnosis Date  . Sickle cell trait ([place]) 03/21/2015  . Systemic lupus erythematosus ([place]) 03/21/2015    Past Surgical History:  Procedure Laterality Date  . CESAREAN SECTION  2002  . QUADRICEPS TENDON REPAIR  07/06/2000    There were no vitals filed for this visit.  Subjective Assessment - 05/30/17 1631    Subjective  Has been doing pretty good - but really tight today. She got mad and upset today and felt her LB tighten up.     Currently in Pain?  Yes    Pain Score  5     Pain Location  Back    Pain Orientation  Right;Left    Pain Descriptors / Indicators  Tightness    Pain Type  Acute pain    Pain Onset  More than a month ago    Pain Frequency  Intermittent    Aggravating Factors   stress/emotional upset; travel     Pain Relieving Factors  heat; stretch                        [facility] Adult PT Treatment/Exercise - 05/30/17 0001      Lumbar Exercises: Stretches   Passive Hamstring Stretch  Left;Right;60 seconds    Quadruped Mid Back Stretch  -- cat cow x10     Prone Mid Back Stretch  -- child's pose 20 x 3     Other Lumbar Stretch Exercise  3 way doorway stretch 30 sec x 2       Lumbar Exercises: Supine   Other Supine Lumbar Exercises  TA contraction then lift legs into table top 10 x 5sec      Modalities   Modalities  Electrical Stimulation;Moist  Heat      Moist Heat Therapy   Number Minutes Moist Heat  15 Minutes    Moist Heat Location  -- thoracic to lumbar       Electrical Stimulation   Electrical Stimulation Location  Rt thoracic and lumbar paraspinals    Electrical Stimulation Action  IFC    Electrical Stimulation Parameters  to tolerance    Electrical Stimulation Goals  Tone;Pain      Manual Therapy   Manual therapy comments  pt prone     Soft tissue mobilization  working through thoracic and lumbar paraspinals with greatest tightness noted in Rt thoracic paraspinals and middle/lower trap/rhomboids             PT Education - 05/30/17 1639    Education provided  Yes    Education Details  HEP     Person(s) Educated  Patient    Methods  Explanation;Demonstration;Tactile cues;Verbal cues;Handout    Comprehension  Verbalized understanding;Returned demonstration;Verbal cues required;Tactile cues required  PT Long Term Goals - 05/27/17 1617      PT LONG TERM GOAL #1   Title  I with advanced HEP (05/30/17)     Status  On-going      PT LONG TERM GOAL #2   Title  demo painfree lumbar ROM ( 06/24/17)     Status  On-going      PT LONG TERM GOAL #3   Title  improve FOTO -/< 33% limited ( 06/24/17)     Status  On-going      PT LONG TERM GOAL #4   Title  sleep per her previous level without waking due to back spasms ( 06/24/17)     Status  On-going only has spasms one night while out of town.       PT LONG TERM GOAL #5   Title  improve FOTO =/< 27% limited 11/22/16    Status  On-going            Plan - 05/30/17 1632    Clinical Impression Statement  Continued muscular tightness and discomfort which patient reports is increased today due to becoming upset at work today. She added knee to chest for paraspinal stretch and doorway stretch to open thoracic area without difficulty. Continued muscular tightness. Responds well to manual work and modalities.     Rehab Potential  Good    PT Frequency  2x /  week    PT Duration  6 weeks    PT Treatment/Interventions  Iontophoresis 4mg /ml Dexamethasone;Dry needling;Manual techniques;Moist Heat;Traction;Ultrasound;Therapeutic activities;Patient/family education;Taping;Therapeutic exercise;Cryotherapy;Electrical Stimulation    PT Next Visit Plan  progress HEP , thoracic openers. manual work and modalities PRN    Consulted and Agree with Plan of Care  Patient       Patient will benefit from skilled therapeutic intervention in order to improve the following deficits and impairments:  Pain, Increased muscle spasms, Obesity  Visit Diagnosis: Acute bilateral low back pain without sciatica  Pain in left hip  Muscle spasm of back     Problem List Patient Active Problem List   Diagnosis Date Noted  . Change in stool caliber 02/19/2017  . History of shingles 12/10/2016  . Environmental allergies 12/10/2016  . Class 1 obesity due to excess calories without serious comorbidity with body mass index (BMI) of 34.0 to 34.9 in adult 12/10/2016  . Vitamin D insufficiency 12/10/2016  . Right knee pain 07/12/2015  . Elevated platelet count 07/12/2015  . H/O colonoscopy 03/29/2015  . Systemic lupus erythematosus ([place]) 03/21/2015  . Sickle cell trait ([place]) 03/21/2015  . History of non anemic vitamin B12 deficiency 03/21/2015  . Chest discomfort 03/21/2015  . Chronic fatigue 03/21/2015    Evelise Reine Nilda Simmer PT, MPH  05/30/2017, 6:19 PM  [facility] [name], [place], [address] Phone: [phone]   Fax:  [phone]  Name: Pam Gomez MRN: [id number] Date of Birth: Mar 26, 1965

## 2017-05-30 NOTE — Patient Instructions (Signed)
Scapula Adduction With Pectoralis Stretch: Low - Standing   Shoulders at 45 hands even with shoulders, keeping weight through legs, shift weight forward until you feel pull or stretch through the front of your chest. Hold _30__ seconds. Do _3__ times, _2-4__ times per day.   Scapula Adduction With Pectoralis Stretch: Mid-Range - Standing   Shoulders at 90 elbows even with shoulders, keeping weight through legs, shift weight forward until you feel pull or strength through the front of your chest. Hold __30_ seconds. Do _3__ times, __2-4_ times per day.   Scapula Adduction With Pectoralis Stretch: High - Standing   Shoulders at 120 hands up high on the doorway, keeping weight on feet, shift weight forward until you feel pull or stretch through the front of your chest. Hold _30__ seconds. Do _3__ times, _2-3__ times per day.   Double Knee to Chest (Flexion)    Gently pull both knees toward chest. Bring knees out to side. Feel stretch in lower back or buttock area. Breathing deeply, Hold _20-30___ seconds. Repeat _3___ times. Do __1-2__ sessions per day.

## 2017-06-03 ENCOUNTER — Ambulatory Visit (INDEPENDENT_AMBULATORY_CARE_PROVIDER_SITE_OTHER): Admitting: Rehabilitative and Restorative Service Providers"

## 2017-06-03 ENCOUNTER — Encounter: Payer: Self-pay | Admitting: Rehabilitative and Restorative Service Providers"

## 2017-06-03 DIAGNOSIS — M545 Low back pain, unspecified: Secondary | ICD-10-CM

## 2017-06-03 DIAGNOSIS — M62830 Muscle spasm of back: Secondary | ICD-10-CM | POA: Diagnosis not present

## 2017-06-03 DIAGNOSIS — M25552 Pain in left hip: Secondary | ICD-10-CM | POA: Diagnosis not present

## 2017-06-03 NOTE — Patient Instructions (Signed)
Resisted External Rotation: in Neutral - Bilateral   PALMS UP Sit or stand, tubing in both hands, elbows at sides, bent to 90, forearms forward. Pinch shoulder blades together and rotate forearms out. Keep elbows at sides. Repeat __10__ times per set. Do _2-3___ sets per session. Do 1-2___ sessions per day.   Low Row: Standing   Face anchor, feet shoulder width apart. Palms up, pull arms back, squeezing shoulder blades together. Repeat 10__ times per set. Do 2-3__ sets per session. Do1_ sessions per day. Anchor Height: Waist   Strengthening: Resisted Extension   Hold tubing in right hand, arm forward. Pull arm back, elbow straight. Repeat _10___ times per set. Do 2-3____ sets per session. Do1___ sessions per day.  Mouse forward keyboard tray

## 2017-06-03 NOTE — Therapy (Signed)
[place], [place], [address] Phone: [phone]   Fax:  [phone]  Physical Therapy Treatment  Patient Details  Name: Pam Gomez MRN: [id number] Date of Birth: 05-20-1965 Referring Provider: Dr Lynne Leader    Encounter Date: 06/03/2017  PT End of Session - 06/03/17 1518    Visit Number  4    Number of Visits  12    Date for PT Re-Evaluation  06/24/17    PT Start Time  7902    PT Stop Time  4097    PT Time Calculation (min)  58 min    Activity Tolerance  Patient tolerated treatment well       Past Medical History:  Diagnosis Date  . Sickle cell trait ([place]) 03/21/2015  . Systemic lupus erythematosus ([place]) 03/21/2015    Past Surgical History:  Procedure Laterality Date  . CESAREAN SECTION  2002  . QUADRICEPS TENDON REPAIR  07/06/2000    There were no vitals filed for this visit.  Subjective Assessment - 06/03/17 1522    Subjective  She had a good weekend with less painin the mid back area. Not much pain just a  couple of times when she felt some spasms in the mid back. She feels some stress in the shoulder blade area. Some increased pain in the mid back area today - may be due to work.     Currently in Pain?  Yes    Pain Score  4     Pain Location  Back    Pain Orientation  Right;Left;Mid    Pain Descriptors / Indicators  Tightness    Pain Type  Acute pain    Pain Onset  More than a month ago    Pain Frequency  Intermittent         [facility] PT Assessment - 06/03/17 0001      Assessment   Medical Diagnosis  lumbosacral strain & Lt hip bursitis    Referring Provider  Dr Lynne Leader     Onset Date/Surgical Date  04/12/17    Hand Dominance  Right      AROM   Lumbar Flexion  to floor    Lumbar Extension  WNL's minimal pain     Lumbar - Right Side Bend  WNL slight twinge    Lumbar - Left Side Bend  WNL    Lumbar - Right Rotation  WNL slight twinge     Lumbar - Left Rotation  WNL  with tightness       Palpation   Palpation comment  tight and tender in thoracic paraspinals - Rt > Lt                    [facility] Adult PT Treatment/Exercise - 06/03/17 0001      Lumbar Exercises: Aerobic   UBE (Upper Arm Bike)  L2 x 4 min alt fwd/back       Lumbar Exercises: Standing   Scapular Retraction  Strengthening;Right;Left;20 reps;Theraband    Theraband Level (Scapular Retraction)  Level 2 (Red)    Row  Strengthening;Right;Left;20 reps;Theraband    Theraband Level (Row)  Level 3 (Green)    Shoulder Extension  Strengthening;Right;Left;20 reps;Theraband    Theraband Level (Shoulder Extension)  Level 3 (Green)      Lumbar Exercises: Supine   Other Supine Lumbar Exercises  lying on yoga egg mid back ~ 1 min       Lumbar Exercises: Prone  Other Prone Lumbar Exercises  prone prop 1-2 min       Moist Heat Therapy   Number Minutes Moist Heat  15 Minutes    Moist Heat Location  -- thoracic to lumbar       Electrical Stimulation   Electrical Stimulation Location  Rt thoracic paraspinals    Electrical Stimulation Action  IFC    Electrical Stimulation Parameters  to tolerance    Electrical Stimulation Goals  Tone;Pain      Manual Therapy   Manual therapy comments  pt prone     Soft tissue mobilization  deep tissue work through thoracic and lumbar paraspinals with greatest tightness noted in Rt thoracic paraspinals and middle/lower trap/rhomboids             PT Education - 06/03/17 1604    Education provided  Yes    Education Details  HEP mouse forward keyboard tray     Person(s) Educated  Patient    Methods  Explanation;Demonstration;Tactile cues;Verbal cues;Handout    Comprehension  Verbalized understanding;Returned demonstration;Verbal cues required;Tactile cues required          PT Long Term Goals - 06/03/17 1518      PT LONG TERM GOAL #1   Title  I with advanced HEP (05/30/17)     Time  6    Period  Weeks    Status  On-going      PT LONG  TERM GOAL #2   Title  demo painfree lumbar ROM ( 06/24/17)     Time  6    Period  Weeks    Status  On-going      PT LONG TERM GOAL #3   Title  improve FOTO -/< 33% limited ( 06/24/17)     Time  6    Period  Weeks    Status  On-going      PT LONG TERM GOAL #4   Title  sleep per her previous level without waking due to back spasms ( 06/24/17)     Time  6    Period  Weeks    Status  On-going      PT LONG TERM GOAL #5   Title  improve FOTO =/< 27% limited 11/22/16    Time  6    Period  Weeks    Status  On-going            Plan - 06/03/17 1554    Clinical Impression Statement  Discussed work - and use of mouse forward keyboard tray. Patient uses mouse for hours each day with mouse on the desktop. Added thoracic strengthening without difficulty. Good improvement noted in trunk mobility. Pt has continued muscular tightness to palpation Rt mid thoracic musculature. Good improvement in tissue extensibility with manual work and modalities. Progressing well toward stated goals of therapy.      Rehab Potential  Good    PT Frequency  2x / week    PT Duration  6 weeks    PT Treatment/Interventions  Iontophoresis 4mg /ml Dexamethasone;Dry needling;Manual techniques;Moist Heat;Traction;Ultrasound;Therapeutic activities;Patient/family education;Taping;Therapeutic exercise;Cryotherapy;Electrical Stimulation    PT Next Visit Plan  progress HEP , thoracic openers. manual work and modalities PRN    Consulted and Agree with Plan of Care  Patient       Patient will benefit from skilled therapeutic intervention in order to improve the following deficits and impairments:  Pain, Increased muscle spasms, Obesity  Visit Diagnosis: Acute bilateral low back pain without sciatica  Muscle spasm of  back  Pain in left hip     Problem List Patient Active Problem List   Diagnosis Date Noted  . Change in stool caliber 02/19/2017  . History of shingles 12/10/2016  . Environmental allergies  12/10/2016  . Class 1 obesity due to excess calories without serious comorbidity with body mass index (BMI) of 34.0 to 34.9 in adult 12/10/2016  . Vitamin D insufficiency 12/10/2016  . Right knee pain 07/12/2015  . Elevated platelet count 07/12/2015  . H/O colonoscopy 03/29/2015  . Systemic lupus erythematosus ([place]) 03/21/2015  . Sickle cell trait ([place]) 03/21/2015  . History of non anemic vitamin B12 deficiency 03/21/2015  . Chest discomfort 03/21/2015  . Chronic fatigue 03/21/2015    Delanee Xin Nilda Simmer  PT, MPH 06/03/2017, 4:04 PM  [facility] [place], [place], [address] Phone: [phone]   Fax:  [phone]  Name: Pam Gomez MRN: [id number] Date of Birth: [birth year]

## 2017-06-06 ENCOUNTER — Encounter: Payer: Self-pay | Admitting: Rehabilitative and Restorative Service Providers"

## 2017-06-06 ENCOUNTER — Ambulatory Visit (INDEPENDENT_AMBULATORY_CARE_PROVIDER_SITE_OTHER): Admitting: Rehabilitative and Restorative Service Providers"

## 2017-06-06 DIAGNOSIS — M545 Low back pain, unspecified: Secondary | ICD-10-CM

## 2017-06-06 DIAGNOSIS — M62830 Muscle spasm of back: Secondary | ICD-10-CM

## 2017-06-06 NOTE — Patient Instructions (Signed)
  Shoulder Blade Squeeze   Can use swim noodle along back  Rotate shoulders back, then squeeze shoulder blades down and back. Hold 10 sec Repeat ___10_ times. Do __2-3__ sessions per day.  Upper Back Strength: Lower Trapezius / Rotator Cuff " L's "     Arms in waitress pose, palms up. Press hands back and slide shoulder blades down and back. Hold for __5__ seconds. Repeat _10___ times. 1-2 times per day.    Scapular Retraction: Elbow Flexion (Standing)  "W's"     With elbows bent to 90, pinch shoulder blades together and rotate arms out, keeping elbows bent. Repeat __10__ times per set. Do __1-2__ sets per session. Do _several ___ sessions per day.  Cat / Cow Flow    Inhale, press spine toward ceiling like a Halloween cat. Keeping strength in arms and abdominals, exhale to soften spine through neutral and into cow pose. Open chest and arch back. Initiate movement between cat and cow at tailbone, one vertebrae at a time. Repeat ____ times.   BACK: Child's Pose (Sciatica)    Sit in knee-chest position and reach arms forward. Separate knees for comfort. Hold position for ___ breaths. Repeat ___ times. Do ___ times per day.   Sitting at edge of chair - knees apart - flatten back and bend forward reaching toward the floor  Then shake your shoulders   Double Knee to Chest (Flexion)    Gently pull both knees toward chest. Feel stretch in lower back or buttock area. Breathing deeply, Hold __20-30__ seconds. Repeat __3__ times. Do __1__ sessions per day.    [facility] [place], [place] [address]  [phone] (office) [phone] (fax)

## 2017-06-06 NOTE — Therapy (Signed)
[place], [place], [address] Phone: [phone]   Fax:  [phone]  Physical Therapy Treatment  Patient Details  Name: Pam Gomez MRN: [id number] Date of Birth: September 18, 1965 Referring Provider: Dr Lynne Leader    Encounter Date: 06/06/2017  PT End of Session - 06/06/17 1633    Visit Number  5    Number of Visits  12    Date for PT Re-Evaluation  06/24/17    PT Start Time  3664    PT Stop Time  1652    PT Time Calculation (min)  58 min    Activity Tolerance  Patient tolerated treatment well       Past Medical History:  Diagnosis Date  . Sickle cell trait ([place]) 03/21/2015  . Systemic lupus erythematosus ([place]) 03/21/2015    Past Surgical History:  Procedure Laterality Date  . CESAREAN SECTION  2002  . QUADRICEPS TENDON REPAIR  07/06/2000    There were no vitals filed for this visit.  Subjective Assessment - 06/06/17 1634    Subjective  Rt upper mid back is feeling good - having some pain in the lower back both sides today - thinks that the tightness is related to stress at work.     Currently in Pain?  Yes    Pain Score  5     Pain Location  Back    Pain Orientation  Lower;Mid;Right;Left    Pain Descriptors / Indicators  Tightness    Pain Type  Acute pain    Pain Onset  More than a month ago                       [facility] Adult PT Treatment/Exercise - 06/06/17 0001      Lumbar Exercises: Stretches   Double Knee to Chest Stretch  3 reps;30 seconds    Other Lumbar Stretch Exercise  3 way doorway stretch 30 sec x 2       Lumbar Exercises: Standing   Scapular Retraction  Strengthening;Right;Left;20 reps;Theraband    Theraband Level (Scapular Retraction)  Level 2 (Red)    Row  Strengthening;Right;Left;20 reps;Theraband    Theraband Level (Row)  Level 3 (Green)    Shoulder Extension  Strengthening;Right;Left;20 reps;Theraband    Theraband Level (Shoulder Extension)  Level 3  (Green)    Other Standing Lumbar Exercises  scap squeeze 10 sec x 10; L's x 10; W's x 10 with swim noodle       Lumbar Exercises: Seated   Other Seated Lumbar Exercises  posterior pelvic tilt rolling forward to strength lumbar spine reaching for the floor 30 sec x 3 reps       Lumbar Exercises: Prone   Other Prone Lumbar Exercises  prone prop 1-2 min       Lumbar Exercises: Quadruped   Madcat/Old Horse  10 reps    Other Quadruped Lumbar Exercises  child's pose 30 sec x 3       Moist Heat Therapy   Number Minutes Moist Heat  15 Minutes    Moist Heat Location  Lumbar Spine thoracic       Electrical Stimulation   Electrical Stimulation Location  bilat lower thoracic/upper lumbar     Electrical Stimulation Action  TENS     Electrical Stimulation Parameters  to tolerance     Electrical Stimulation Goals  Tone;Pain      Manual Therapy   Manual therapy comments  pt prone  Soft tissue mobilization  deep tissue work through thoracic and lumbar paraspinals with greatest tightness noted in bilat thoracic upper lumbar paraspinals              PT Education - 06/06/17 1643    Education provided  Yes    Education Details  HEP     Person(s) Educated  Patient    Methods  Explanation;Demonstration;Tactile cues;Verbal cues;Handout    Comprehension  Verbalized understanding;Returned demonstration;Verbal cues required;Tactile cues required          PT Long Term Goals - 06/03/17 1518      PT LONG TERM GOAL #1   Title  I with advanced HEP (05/30/17)     Time  6    Period  Weeks    Status  On-going      PT LONG TERM GOAL #2   Title  demo painfree lumbar ROM ( 06/24/17)     Time  6    Period  Weeks    Status  On-going      PT LONG TERM GOAL #3   Title  improve FOTO -/< 33% limited ( 06/24/17)     Time  6    Period  Weeks    Status  On-going      PT LONG TERM GOAL #4   Title  sleep per her previous level without waking due to back spasms ( 06/24/17)     Time  6    Period   Weeks    Status  On-going      PT LONG TERM GOAL #5   Title  improve FOTO =/< 27% limited 11/22/16    Time  6    Period  Weeks    Status  On-going            Plan - 06/06/17 1637    Clinical Impression Statement  Improving Rt thoracic pain and tightness but increased tightness reported in the lower back/lower rib area. REsponded well to stretching and manual work followed by modalities.     Rehab Potential  Good    PT Frequency  2x / week    PT Duration  6 weeks    PT Treatment/Interventions  Iontophoresis 4mg /ml Dexamethasone;Dry needling;Manual techniques;Moist Heat;Traction;Ultrasound;Therapeutic activities;Patient/family education;Taping;Therapeutic exercise;Cryotherapy;Electrical Stimulation    PT Next Visit Plan  progress HEP , thoracic openers. manual work and modalities PRN    Consulted and Agree with Plan of Care  Patient       Patient will benefit from skilled therapeutic intervention in order to improve the following deficits and impairments:  Pain, Increased muscle spasms, Obesity  Visit Diagnosis: Acute bilateral low back pain without sciatica  Muscle spasm of back     Problem List Patient Active Problem List   Diagnosis Date Noted  . Change in stool caliber 02/19/2017  . History of shingles 12/10/2016  . Environmental allergies 12/10/2016  . Class 1 obesity due to excess calories without serious comorbidity with body mass index (BMI) of 34.0 to 34.9 in adult 12/10/2016  . Vitamin D insufficiency 12/10/2016  . Right knee pain 07/12/2015  . Elevated platelet count 07/12/2015  . H/O colonoscopy 03/29/2015  . Systemic lupus erythematosus ([place]) 03/21/2015  . Sickle cell trait ([place]) 03/21/2015  . History of non anemic vitamin B12 deficiency 03/21/2015  . Chest discomfort 03/21/2015  . Chronic fatigue 03/21/2015    Aiyannah Fayad Nilda Simmer PT,MPH  06/06/2017, [place]  [place], [place], [address] Phone: [phone]   Fax:  [phone]  Name: Pam Gomez MRN: [id number] Date of Birth: [birth year]

## 2017-06-10 ENCOUNTER — Encounter: Payer: Self-pay | Admitting: Rehabilitative and Restorative Service Providers"

## 2017-06-10 ENCOUNTER — Ambulatory Visit (INDEPENDENT_AMBULATORY_CARE_PROVIDER_SITE_OTHER): Admitting: Rehabilitative and Restorative Service Providers"

## 2017-06-10 DIAGNOSIS — M545 Low back pain, unspecified: Secondary | ICD-10-CM

## 2017-06-10 DIAGNOSIS — M62830 Muscle spasm of back: Secondary | ICD-10-CM

## 2017-06-10 DIAGNOSIS — R29898 Other symptoms and signs involving the musculoskeletal system: Secondary | ICD-10-CM

## 2017-06-10 NOTE — Patient Instructions (Addendum)
Bow and arrow - step back  Green theraband  15-20 reps    Standing abdominal pulls - standing with band to side Push arms out away from chest then back to chest  Repeat  With opposite side  Green theraband  15-20 reps   Lower Trunk Rotation Stretch    Keeping back flat and feet together, rotate knees to left side. Hold _20-30___ seconds. Repeat __3__ times per set. Do _1___ sets per session. Do __1-2__ sessions per day.    Side Bend, Sitting feet flat on floor - not cross legged     Sit cross-legged on floor. Bend to one side, touching elbow to floor. Hold __20-30_ seconds. To increase stretch, raise other arm above head.  Repeat _2-3__ times per session. Do __1-2_ sessions per day.

## 2017-06-10 NOTE — Therapy (Signed)
[place], [place], [address] Phone: [phone]   Fax:  [phone]  Physical Therapy Treatment  Patient Details  Name: Pam Gomez MRN: [id number] Date of Birth: 03-05-1965 Referring Provider: Dr Lynne Leader    Encounter Date: 06/10/2017  PT End of Session - 06/10/17 1613    Visit Number  6    Number of Visits  12    Date for PT Re-Evaluation  06/24/17    PT Start Time  1608    PT Stop Time  1700    PT Time Calculation (min)  52 min    Activity Tolerance  Patient tolerated treatment well       Past Medical History:  Diagnosis Date  . Sickle cell trait ([place]) 03/21/2015  . Systemic lupus erythematosus ([place]) 03/21/2015    Past Surgical History:  Procedure Laterality Date  . CESAREAN SECTION  2002  . QUADRICEPS TENDON REPAIR  07/06/2000    There were no vitals filed for this visit.  Subjective Assessment - 06/10/17 1616    Subjective  Overall improving - some tightness in the Rt shoulder and through trunk.     Currently in Pain?  Yes    Pain Score  4     Pain Location  Back    Pain Orientation  Right;Mid;Lower    Pain Descriptors / Indicators  Tightness    Pain Type  Acute pain    Pain Onset  More than a month ago    Pain Frequency  Intermittent         [facility] PT Assessment - 06/10/17 0001      Assessment   Medical Diagnosis  lumbosacral strain & Lt hip bursitis    Referring Provider  Dr Lynne Leader     Onset Date/Surgical Date  04/12/17    Hand Dominance  Right      AROM   Lumbar Flexion  WNL's    Lumbar Extension  WNL's     Lumbar - Right Side Bend  WNL's     Lumbar - Left Side Bend  WNL's    Lumbar - Right Rotation  WNL's    Lumbar - Left Rotation  WNL's       Palpation   Palpation comment  tight and tender in thoracic paraspinals; lower and mid traps - Rt                   [facility] Adult PT Treatment/Exercise - 06/10/17 0001      Lumbar Exercises:  Stretches   Double Knee to Chest Stretch  3 reps;30 seconds    Other Lumbar Stretch Exercise  3 way doorway stretch 30 sec x 2     Other Lumbar Stretch Exercise  lateral trunk flexion sitting 30 sec x 3; trunk rotation supine x 3 each side 20 sec hold        Lumbar Exercises: Aerobic   UBE (Upper Arm Bike)  L2 x 4 min alt fwd/back       Lumbar Exercises: Standing   Scapular Retraction  Strengthening;Right;Left;20 reps;Theraband    Theraband Level (Scapular Retraction)  Level 2 (Red)    Row  Strengthening;Right;Left;20 reps;Theraband    Theraband Level (Row)  Level 3 (Green)    Shoulder Extension  Strengthening;Right;Left;20 reps;Theraband    Theraband Level (Shoulder Extension)  Level 3 (Green)    Other Standing Lumbar Exercises  scap squeeze 10 sec x 10; L's x 10; W's x 10 with  swim noodle     Other Standing Lumbar Exercises  bow and arrow step back x 15 each UE;  anti-rotation green TB x 15       Lumbar Exercises: Supine   Bridge  10 reps    Other Supine Lumbar Exercises  TA contraction then lift legs into table top 10 x 5sec      Lumbar Exercises: Prone   Other Prone Lumbar Exercises  prone prop 1-2 min       Lumbar Exercises: Quadruped   Madcat/Old Horse  5 reps    Other Quadruped Lumbar Exercises  child's pose 30 sec x 3       Moist Heat Therapy   Number Minutes Moist Heat  15 Minutes    Moist Heat Location  Lumbar Spine thoracic       Electrical Stimulation   Electrical Stimulation Location  Rt thoracic     Electrical Stimulation Action  TENS     Electrical Stimulation Parameters  to tolerance    Electrical Stimulation Goals  Tone;Pain      Manual Therapy   Manual therapy comments  pt prone     Soft tissue mobilization  deep tissue work through thoracic paraspinals with greatest tightness noted in Rt thoracic paraspinals, lower/mid trap             PT Education - 06/10/17 1700    Education provided  Yes    Education Details  HEP     Person(s) Educated   Patient    Methods  Explanation;Demonstration;Tactile cues;Verbal cues;Handout    Comprehension  Verbalized understanding;Returned demonstration;Verbal cues required;Tactile cues required          PT Long Term Goals - 06/10/17 1614      PT LONG TERM GOAL #1   Title  I with advanced HEP (05/30/17)     Time  6    Period  Weeks    Status  On-going      PT LONG TERM GOAL #2   Title  demo painfree lumbar ROM ( 06/24/17)     Time  6    Period  Weeks    Status  On-going      PT LONG TERM GOAL #3   Title  improve FOTO -/< 33% limited ( 06/24/17)     Time  6    Period  Weeks    Status  On-going      PT LONG TERM GOAL #4   Title  sleep per her previous level without waking due to back spasms ( 06/24/17)     Time  6    Period  Weeks    Status  On-going      PT LONG TERM GOAL #5   Title  improve FOTO =/< 27% limited 11/22/16    Time  6    Period  Weeks    Status  On-going            Plan - 06/10/17 1656    Clinical Impression Statement  Continued progress with less pain and tightness. Pain and tightness continue to change location but primarily are foces on Rt side through the thoracic paraspinals; mid/lower trap. She responds well to exercise; manual work and modalities. ROM is WFL's and patient is progressing well toward goals of therapy.     Rehab Potential  Good    PT Frequency  2x / week    PT Duration  6 weeks    PT Treatment/Interventions  Iontophoresis 4mg /ml Dexamethasone;Dry  needling;Manual techniques;Moist Heat;Traction;Ultrasound;Therapeutic activities;Patient/family education;Taping;Therapeutic exercise;Cryotherapy;Electrical Stimulation    PT Next Visit Plan  progress HEP , thoracic openers. manual work and modalities PRN    Consulted and Agree with Plan of Care  Patient       Patient will benefit from skilled therapeutic intervention in order to improve the following deficits and impairments:  Pain, Increased muscle spasms, Obesity  Visit Diagnosis: Acute  bilateral low back pain without sciatica  Muscle spasm of back  Other symptoms and signs involving the musculoskeletal system     Problem List Patient Active Problem List   Diagnosis Date Noted  . Change in stool caliber 02/19/2017  . History of shingles 12/10/2016  . Environmental allergies 12/10/2016  . Class 1 obesity due to excess calories without serious comorbidity with body mass index (BMI) of 34.0 to 34.9 in adult 12/10/2016  . Vitamin D insufficiency 12/10/2016  . Right knee pain 07/12/2015  . Elevated platelet count 07/12/2015  . H/O colonoscopy 03/29/2015  . Systemic lupus erythematosus ([place]) 03/21/2015  . Sickle cell trait ([place]) 03/21/2015  . History of non anemic vitamin B12 deficiency 03/21/2015  . Chest discomfort 03/21/2015  . Chronic fatigue 03/21/2015    Ramaj Frangos Nilda Simmer PT, MPH  06/10/2017, 5:03 PM  [facility] [place], [place], [address] Phone: [phone]   Fax:  [phone]  Name: Pam Gomez MRN: [id number] Date of Birth: 03-Dec-1965

## 2017-06-19 ENCOUNTER — Encounter: Admitting: Physical Therapy

## 2017-06-20 ENCOUNTER — Encounter: Admitting: Physical Therapy

## 2017-06-27 ENCOUNTER — Encounter: Payer: Self-pay | Admitting: Rehabilitative and Restorative Service Providers"

## 2017-06-27 ENCOUNTER — Ambulatory Visit (INDEPENDENT_AMBULATORY_CARE_PROVIDER_SITE_OTHER): Admitting: Rehabilitative and Restorative Service Providers"

## 2017-06-27 DIAGNOSIS — R29898 Other symptoms and signs involving the musculoskeletal system: Secondary | ICD-10-CM

## 2017-06-27 DIAGNOSIS — M545 Low back pain, unspecified: Secondary | ICD-10-CM

## 2017-06-27 DIAGNOSIS — M62830 Muscle spasm of back: Secondary | ICD-10-CM | POA: Diagnosis not present

## 2017-06-27 NOTE — Therapy (Addendum)
[place], [place], [address] Phone: [phone]   Fax:  [phone]  Physical Therapy Treatment  Patient Details  Name: Pam Gomez MRN: [id number] Date of Birth: Jan 24, 1966 Referring Provider: Dr Lynne Leader    Encounter Date: 06/27/2017  PT End of Session - 06/27/17 1656    Visit Number  7    Number of Visits  18    Date for PT Re-Evaluation  08/08/17    PT Start Time  1651    PT Stop Time  1750    PT Time Calculation (min)  59 min    Activity Tolerance  Patient tolerated treatment well       Past Medical History:  Diagnosis Date  . Sickle cell trait ([place]) 03/21/2015  . Systemic lupus erythematosus ([place]) 03/21/2015    Past Surgical History:  Procedure Laterality Date  . CESAREAN SECTION  2002  . QUADRICEPS TENDON REPAIR  07/06/2000    There were no vitals filed for this visit.  Subjective Assessment - 06/27/17 1701    Subjective  Overall improving - some tightness in the back on an intermittent basis - depends on her exercise or activities. Overall about 65-70% improval.     Currently in Pain?  No/denies         [facility] PT Assessment - 06/27/17 0001      Assessment   Medical Diagnosis  lumbosacral strain & Lt hip bursitis    Referring Provider  Dr Lynne Leader     Onset Date/Surgical Date  04/12/17    Hand Dominance  Right    Next MD Visit  PRN       Observation/Other Assessments   Focus on Therapeutic Outcomes (FOTO)   30% limitation       AROM   Lumbar Flexion  WNL's    Lumbar Extension  WNL's     Lumbar - Right Side Bend  WNL's     Lumbar - Left Side Bend  WNL's    Lumbar - Right Rotation  WNL's    Lumbar - Left Rotation  WNL's       Palpation   Palpation comment  tight and tender in thoracic paraspinals; lower and mid traps - Rt                   [facility] Adult PT Treatment/Exercise - 06/27/17 0001      Lumbar Exercises: Stretches   Double Knee to Chest  Stretch  3 reps;30 seconds    Other Lumbar Stretch Exercise  3 way doorway stretch 30 sec x 2       Lumbar Exercises: Aerobic   Stationary Bike  L3x5'      Lumbar Exercises: Supine   Bridge  10 reps      Lumbar Exercises: Quadruped   Madcat/Old Horse  5 reps    Other Quadruped Lumbar Exercises  child's pose 30 sec x 3       Moist Heat Therapy   Number Minutes Moist Heat  15 Minutes    Moist Heat Location  Lumbar Spine thoracic       Electrical Stimulation   Electrical Stimulation Location  Lt low back to central LB     Electrical Stimulation Action  IFC    Electrical Stimulation Parameters  to tolerance    Electrical Stimulation Goals  Tone;Pain      Manual Therapy   Manual therapy comments  pt prone     Soft  tissue mobilization  deep tissue work through [employer] lumbar and LB musculature                   PT Long Term Goals - 06/27/17 1702      PT LONG TERM GOAL #1   Title  I with advanced HEP (08/08/17)     Time  12    Period  Weeks    Status  Revised      PT LONG TERM GOAL #2   Title  demo painfree lumbar ROM ( 08/08/17)     Time  12    Period  Weeks    Status  Revised      PT LONG TERM GOAL #3   Title  improve FOTO -/< 33% limited ( 06/24/17)     Time  6    Period  Weeks    Status  Achieved      PT LONG TERM GOAL #4   Title  sleep per her previous level without waking due to back spasms ( 08/08/17)     Time  12    Period  Weeks    Status  Revised      PT LONG TERM GOAL #5   Title  -    Time  0    Period  Weeks    Status  Unable to assess            Plan - 06/27/17 1711    Clinical Impression Statement  Patient continues to improve with decreased pain and improved functional mobility. She continues to have intermittent pain and discomfort related to exercise and activities. patient will benefit from continued PT to progress with independent HEP.     Rehab Potential  Good    PT Frequency  2x / week    PT Duration  6 weeks    PT  Treatment/Interventions  Iontophoresis 4mg /ml Dexamethasone;Dry needling;Manual techniques;Moist Heat;Traction;Ultrasound;Therapeutic activities;Patient/family education;Taping;Therapeutic exercise;Cryotherapy;Electrical Stimulation    PT Next Visit Plan  progress HEP , thoracic openers. manual work and modalities PRN    Consulted and Agree with Plan of Care  Patient       Patient will benefit from skilled therapeutic intervention in order to improve the following deficits and impairments:  Pain, Increased muscle spasms, Obesity  Visit Diagnosis: Acute bilateral low back pain without sciatica - Plan: PT plan of care cert/re-cert  Muscle spasm of back - Plan: PT plan of care cert/re-cert  Other symptoms and signs involving the musculoskeletal system - Plan: PT plan of care cert/re-cert     Problem List Patient Active Problem List   Diagnosis Date Noted  . Change in stool caliber 02/19/2017  . History of shingles 12/10/2016  . Environmental allergies 12/10/2016  . Class 1 obesity due to excess calories without serious comorbidity with body mass index (BMI) of 34.0 to 34.9 in adult 12/10/2016  . Vitamin D insufficiency 12/10/2016  . Right knee pain 07/12/2015  . Elevated platelet count 07/12/2015  . H/O colonoscopy 03/29/2015  . Systemic lupus erythematosus ([place]) 03/21/2015  . Sickle cell trait ([place]) 03/21/2015  . History of non anemic vitamin B12 deficiency 03/21/2015  . Chest discomfort 03/21/2015  . Chronic fatigue 03/21/2015    Marjan Rosman Nilda Simmer PT, MPH  06/27/2017, 5:53 PM  [facility] [place], [place], [address] Phone: [phone]   Fax:  [phone]  Name: Lashuna Tamashiro MRN: [id number] Date of Birth: 1965-04-21

## 2017-06-27 NOTE — Addendum Note (Signed)
Addended by: Everardo All on: 06/27/2017 05:53 PM   Modules accepted: Orders

## 2017-07-08 ENCOUNTER — Ambulatory Visit (INDEPENDENT_AMBULATORY_CARE_PROVIDER_SITE_OTHER): Admitting: Rehabilitative and Restorative Service Providers"

## 2017-07-08 ENCOUNTER — Encounter: Payer: Self-pay | Admitting: Rehabilitative and Restorative Service Providers"

## 2017-07-08 DIAGNOSIS — R29898 Other symptoms and signs involving the musculoskeletal system: Secondary | ICD-10-CM | POA: Diagnosis not present

## 2017-07-08 DIAGNOSIS — M545 Low back pain, unspecified: Secondary | ICD-10-CM

## 2017-07-08 DIAGNOSIS — M62830 Muscle spasm of back: Secondary | ICD-10-CM | POA: Diagnosis not present

## 2017-07-08 NOTE — Therapy (Signed)
[facility] [place], [place], [address] Phone: [phone]   Fax:  [phone]  Physical Therapy Treatment  Patient Details  Name: Pam Gomez MRN: [id number] Date of Birth: May 27, 1965 Referring Provider: Dr Lynne Leader    Encounter Date: 07/08/2017  PT End of Session - 07/08/17 1607    Visit Number  8    Number of Visits  18    Date for PT Re-Evaluation  08/08/17    PT Start Time  6568    PT Stop Time  1704    PT Time Calculation (min)  59 min    Activity Tolerance  Patient tolerated treatment well       Past Medical History:  Diagnosis Date  . Sickle cell trait ([place]) 03/21/2015  . Systemic lupus erythematosus ([place]) 03/21/2015    Past Surgical History:  Procedure Laterality Date  . CESAREAN SECTION  2002  . QUADRICEPS TENDON REPAIR  07/06/2000    There were no vitals filed for this visit.  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 Also notes some "catching in the Rt low back area".     Currently in Pain?  No/denies    Pain Location  Back    Pain Orientation  Right;Mid;Lower    Pain Descriptors / Indicators  Tightness    Pain Type  Acute pain    Pain Onset  More than a month ago    Pain Frequency  Intermittent    Aggravating Factors   stress; UE use; emotiional upset     Pain Relieving Factors  heat; stretch          [facility] PT Assessment - 07/08/17 0001      Assessment   Medical Diagnosis  lumbosacral strain & Lt hip bursitis    Referring Provider  Dr Lynne Leader     Onset Date/Surgical Date  04/12/17    Hand Dominance  Right    Next MD Visit  PRN       AROM   AROM Assessment Site  -- tight end range trunk flexion       Palpation   Palpation comment  tight and tender in Rt thoracic paraspinals; lower and mid traps - Rt                   [facility]  Adult PT Treatment/Exercise - 07/08/17 0001      Lumbar Exercises: Stretches   Double Knee to Chest Stretch  3 reps;30 seconds    Other Lumbar Stretch Exercise  3 way doorway stretch 30 sec x 2     Other Lumbar Stretch Exercise  lateral trunk flexion sitting 30 sec x 3; trunk rotation supine x 3 each side 20 sec hold        Lumbar Exercises: Aerobic   UBE (Upper Arm Bike)  L2 x 4 min alt fwd/back       Lumbar Exercises: Supine   Bridge  10 reps    Other Supine Lumbar Exercises  sash with green TB x 10 each UE       Lumbar Exercises: Quadruped   Madcat/Old Horse  5 reps    Other Quadruped Lumbar Exercises  child's pose 30 sec x 3       Moist Heat Therapy   Number Minutes Moist Heat  15 Minutes  Moist Heat Location  Lumbar Spine thoracic       Electrical Stimulation   Electrical Stimulation Location  Rt low back to central LB     Electrical Stimulation Action  IFC    Electrical Stimulation Parameters  to tolerance    Electrical Stimulation Goals  Tone;Pain      Manual Therapy   Manual therapy comments  pt prone     Soft tissue mobilization  deep tissue work through [employer] lumbar and LB musculature                   PT Long Term Goals - 07/08/17 1608      PT LONG TERM GOAL #1   Title  I with advanced HEP (08/08/17)     Time  12    Period  Weeks    Status  On-going      PT LONG TERM GOAL #2   Title  demo painfree lumbar ROM ( 08/08/17)     Time  12    Period  Weeks    Status  On-going      PT LONG TERM GOAL #3   Title  improve FOTO -/< 33% limited ( 06/24/17)     Time  6    Period  Weeks    Status  Achieved      PT LONG TERM GOAL #4   Title  sleep per her previous level without waking due to back spasms ( 08/08/17)     Time  12    Period  Weeks    Status  On-going            Plan - 07/08/17 1619    Clinical Impression Statement  Some flare up of pain in the Rt mid to low back 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 She did not  experience significant increase in pain - just increaesd tightness. Tolerated exercise well. Responded well to manual work and modalities.     Rehab Potential  Good    PT Frequency  2x / week    PT Duration  6 weeks    PT Treatment/Interventions  Iontophoresis 4mg /ml Dexamethasone;Dry needling;Manual techniques;Moist Heat;Traction;Ultrasound;Therapeutic activities;Patient/family education;Taping;Therapeutic exercise;Cryotherapy;Electrical Stimulation    PT Next Visit Plan  progress HEP , thoracic openers. manual work and modalities PRN    Consulted and Agree with Plan of Care  Patient       Patient will benefit from skilled therapeutic intervention in order to improve the following deficits and impairments:  Pain, Increased muscle spasms, Obesity  Visit Diagnosis: Acute bilateral low back pain without sciatica  Muscle spasm of back  Other symptoms and signs involving the musculoskeletal system     Problem List Patient Active Problem List   Diagnosis Date Noted  . Change in stool caliber 02/19/2017  . History of shingles 12/10/2016  . Environmental allergies 12/10/2016  . Class 1 obesity due to excess calories without serious comorbidity with body mass index (BMI) of 34.0 to 34.9 in adult 12/10/2016  . Vitamin D insufficiency 12/10/2016  . Right knee pain 07/12/2015  . Elevated platelet count 07/12/2015  . H/O colonoscopy 03/29/2015  . Systemic lupus erythematosus ([place]) 03/21/2015  . Sickle cell trait ([place]) 03/21/2015  . History of non anemic vitamin B12 deficiency 03/21/2015  . Chest discomfort 03/21/2015  . Chronic fatigue 03/21/2015    Cory Kitt Nilda Simmer PT, MPH  07/08/2017, 4:38 PM  [facility]  [place], [place], [address] Phone: [phone]   Fax:  [phone]  Name: Pam Gomez MRN: [id number] Date of Birth: 12/02/1965

## 2017-07-11 ENCOUNTER — Encounter: Admitting: Rehabilitative and Restorative Service Providers"

## 2017-07-18 ENCOUNTER — Ambulatory Visit (INDEPENDENT_AMBULATORY_CARE_PROVIDER_SITE_OTHER): Admitting: Rehabilitative and Restorative Service Providers"

## 2017-07-18 DIAGNOSIS — M545 Low back pain, unspecified: Secondary | ICD-10-CM

## 2017-07-18 DIAGNOSIS — R29898 Other symptoms and signs involving the musculoskeletal system: Secondary | ICD-10-CM

## 2017-07-18 DIAGNOSIS — M62830 Muscle spasm of back: Secondary | ICD-10-CM

## 2017-07-18 NOTE — Therapy (Signed)
[facility] [place], [place], [address] Phone: [phone]   Fax:  [phone]  Physical Therapy Treatment  Patient Details  Name: Pam Gomez MRN: [id number] Date of Birth: December 31, 1965 Referring Provider: Dr Lynne Leader    Encounter Date: 07/18/2017  PT End of Session - 07/18/17 1625    Visit Number  9    Number of Visits  18    Date for PT Re-Evaluation  08/08/17    PT Start Time  1625    PT Stop Time  1700    PT Time Calculation (min)  35 min    Activity Tolerance  Patient tolerated treatment well       Past Medical History:  Diagnosis Date  . Sickle cell trait ([place]) 03/21/2015  . Systemic lupus erythematosus ([place]) 03/21/2015    Past Surgical History:  Procedure Laterality Date  . CESAREAN SECTION  2002  . QUADRICEPS TENDON REPAIR  07/06/2000    There were no vitals filed for this visit.  Subjective Assessment - 07/18/17 1632    Subjective  Patient reports that she has no pain in her mid or low back. She has some tightness in the neck area today. Feels confident in finishing up with therapy today and continuing with her HEP independently.     Currently in Pain?  No/denies                       [facility] Adult PT Treatment/Exercise - 07/18/17 0001      Lumbar Exercises: Stretches   Double Knee to Chest Stretch  3 reps;30 seconds    Other Lumbar Stretch Exercise  3 way doorway stretch 30 sec x 2     Other Lumbar Stretch Exercise  lateral trunk flexion sitting 30 sec x 3; trunk rotation supine x 3 each side 20 sec hold        Lumbar Exercises: Aerobic   Nustep  L5 x 5 min UE #6      Lumbar Exercises: Seated   Other Seated Lumbar Exercises  sit to stand core engaged x 10       Lumbar Exercises: Supine   Bridge  10 reps    Other Supine Lumbar Exercises  sash with green TB x 10 each UE       Lumbar Exercises: Prone   Opposite Arm/Leg Raise  Right arm/Left leg;Left arm/Right  leg;5 reps      Lumbar Exercises: Quadruped   Madcat/Old Horse  5 reps    Other Quadruped Lumbar Exercises  child's pose 30 sec x 3       Manual Therapy   Manual therapy comments  pt supine     Soft tissue mobilization  manual work through the upper thoracic and cervical musculature                   PT Long Term Goals - 07/18/17 1628      PT LONG TERM GOAL #1   Title  I with advanced HEP (08/08/17)     Time  12    Period  Weeks    Status  Achieved      PT LONG TERM GOAL #2   Title  demo painfree lumbar ROM ( 08/08/17)     Time  12    Period  Weeks    Status  Achieved      PT LONG TERM GOAL #3   Title  improve FOTO -/<  33% limited ( 06/24/17)     Time  6    Period  Weeks    Status  Achieved      PT LONG TERM GOAL #4   Title  sleep per her previous level without waking due to back spasms ( 08/08/17)     Time  12    Period  Weeks    Status  Achieved            Plan - 07/18/17 1629    Clinical Impression Statement  Patient reports that her back is feeling "pretty good". She has returned to gym program. Lumbar mobility is WFL's and there is minimal tenderness to palpation. There is minimal to no pain.      Clinical Presentation  Stable    Clinical Decision Making  Low    Rehab Potential  Good    PT Frequency  2x / week    PT Duration  6 weeks    PT Treatment/Interventions  Iontophoresis 37m/ml Dexamethasone;Dry needling;Manual techniques;Moist Heat;Traction;Ultrasound;Therapeutic activities;Patient/family education;Taping;Therapeutic exercise;Cryotherapy;Electrical Stimulation    PT Next Visit Plan  D/C to independent HEP     Consulted and Agree with Plan of Care  Patient       Patient will benefit from skilled therapeutic intervention in order to improve the following deficits and impairments:  Pain, Increased muscle spasms, Obesity  Visit Diagnosis: Acute bilateral low back pain without sciatica  Muscle spasm of back  Other symptoms and signs  involving the musculoskeletal system     Problem List Patient Active Problem List   Diagnosis Date Noted  . Change in stool caliber 02/19/2017  . History of shingles 12/10/2016  . Environmental allergies 12/10/2016  . Class 1 obesity due to excess calories without serious comorbidity with body mass index (BMI) of 34.0 to 34.9 in adult 12/10/2016  . Vitamin D insufficiency 12/10/2016  . Right knee pain 07/12/2015  . Elevated platelet count 07/12/2015  . H/O colonoscopy 03/29/2015  . Systemic lupus erythematosus ([place] 03/21/2015  . Sickle cell trait ([place] 03/21/2015  . History of non anemic vitamin B12 deficiency 03/21/2015  . Chest discomfort 03/21/2015  . Chronic fatigue 03/21/2015    Celyn PNilda SimmerPT, MPH 07/18/2017, 5:06 PM  [facility] [address]Phone: [phone]  Fax:  [phone] Name: Pam HeronMRN: [id number] of Birth: [birth year] PHYSICAL THERAPY DISCHARGE SUMMARY  Visits from Start of Care: 9  Current functional level related to goals / functional outcomes: See progress note for discharge status   Remaining deficits: No known deficits    Education / Equipment: HEP Plan: Patient agrees to discharge.  Patient goals were partially met. Patient is being discharged due to being pleased with the current functional level.  ?????    Celyn P. HHelene KelpPT, MPH 07/18/17 5:08 PM

## 2017-12-23 ENCOUNTER — Other Ambulatory Visit: Payer: Self-pay | Admitting: Osteopathic Medicine

## 2017-12-23 DIAGNOSIS — Z1239 Encounter for other screening for malignant neoplasm of breast: Secondary | ICD-10-CM

## 2017-12-25 ENCOUNTER — Ambulatory Visit (INDEPENDENT_AMBULATORY_CARE_PROVIDER_SITE_OTHER)

## 2017-12-25 DIAGNOSIS — Z1231 Encounter for screening mammogram for malignant neoplasm of breast: Secondary | ICD-10-CM

## 2017-12-25 DIAGNOSIS — Z1239 Encounter for other screening for malignant neoplasm of breast: Secondary | ICD-10-CM

## 2018-02-19 ENCOUNTER — Ambulatory Visit (INDEPENDENT_AMBULATORY_CARE_PROVIDER_SITE_OTHER): Payer: Federal, State, Local not specified - PPO | Admitting: Physician Assistant

## 2018-02-19 ENCOUNTER — Encounter: Payer: Self-pay | Admitting: Physician Assistant

## 2018-02-19 VITALS — BP 126/82 | HR 66 | Ht 63.0 in | Wt 190.0 lb

## 2018-02-19 DIAGNOSIS — M25561 Pain in right knee: Secondary | ICD-10-CM | POA: Diagnosis not present

## 2018-02-19 DIAGNOSIS — Z0000 Encounter for general adult medical examination without abnormal findings: Secondary | ICD-10-CM

## 2018-02-19 DIAGNOSIS — M25562 Pain in left knee: Secondary | ICD-10-CM

## 2018-02-19 DIAGNOSIS — Z6834 Body mass index (BMI) 34.0-34.9, adult: Secondary | ICD-10-CM

## 2018-02-19 DIAGNOSIS — Z13220 Encounter for screening for lipoid disorders: Secondary | ICD-10-CM

## 2018-02-19 DIAGNOSIS — E6609 Other obesity due to excess calories: Secondary | ICD-10-CM | POA: Diagnosis not present

## 2018-02-19 DIAGNOSIS — G8929 Other chronic pain: Secondary | ICD-10-CM

## 2018-02-19 DIAGNOSIS — E559 Vitamin D deficiency, unspecified: Secondary | ICD-10-CM

## 2018-02-19 DIAGNOSIS — Z131 Encounter for screening for diabetes mellitus: Secondary | ICD-10-CM

## 2018-02-19 DIAGNOSIS — E66811 Obesity, class 1: Secondary | ICD-10-CM

## 2018-02-19 MED ORDER — LORCASERIN HCL ER 20 MG PO TB24: 1.0000 | ORAL_TABLET | Freq: Every day | ORAL | 2 refills | Status: DC

## 2018-02-19 MED ORDER — ERGOCALCIFEROL 1.25 MG (50000 UT) PO CAPS: 50000.0000 [IU] | ORAL_CAPSULE | ORAL | 1 refills | Status: DC

## 2018-02-19 NOTE — Patient Instructions (Signed)

## 2018-02-19 NOTE — Progress Notes (Signed)
Subjective:     Pam Gomez is a 53 y.o. female and is here for a comprehensive physical exam. The patient reports problems - she would like to discuss weight.    Having bilateral knee pain for months/years. Seems to be worsening. No trauma or injury. Taking as needed NSAIDs. Never had imaging.   Social History   Socioeconomic History  . Marital status: Married    Spouse name: Not on file  . Number of children: Not on file  . Years of education: Not on file  . Highest education level: Not on file  Occupational History  . Not on file  Social Needs  . Financial resource strain: Not on file  . Food insecurity:    Worry: Not on file    Inability: Not on file  . Transportation needs:    Medical: Not on file    Non-medical: Not on file  Tobacco Use  . Smoking status: Never Smoker  . Smokeless tobacco: Never Used  Substance and Sexual Activity  . Alcohol use: No    Alcohol/week: 0.0 standard drinks  . Drug use: No  . Sexual activity: Not on file  Lifestyle  . Physical activity:    Days per week: Not on file    Minutes per session: Not on file  . Stress: Not on file  Relationships  . Social connections:    Talks on phone: Not on file    Gets together: Not on file    Attends religious service: Not on file    Active member of club or organization: Not on file    Attends meetings of clubs or organizations: Not on file    Relationship status: Not on file  . Intimate partner violence:    Fear of current or ex partner: Not on file    Emotionally abused: Not on file    Physically abused: Not on file    Forced sexual activity: Not on file  Other Topics Concern  . Not on file  Social History Narrative  . Not on file   Health Maintenance  Topic Date Due  . INFLUENZA VACCINE  04/29/2018 (Originally 08/29/2017)  . HIV Screening  02/20/2019 (Originally 06/13/1979)  . MAMMOGRAM  12/26/2019  . PAP SMEAR-Modifier  07/11/2020  . TETANUS/TDAP  04/15/2023  . COLONOSCOPY   03/24/2025    The following portions of the patient's history were reviewed and updated as appropriate: allergies, current medications, past family history, past medical history, past social history, past surgical history and problem list.  Review of Systems Pertinent items noted in HPI and remainder of comprehensive ROS otherwise negative.   Objective:    BP 126/82   Pulse 66   Ht 5\' 3"  (1.6 m)   Wt 190 lb (86.2 kg)   LMP 11/08/2011   BMI 33.66 kg/m  General appearance: alert, cooperative and appears stated age Head: Normocephalic, without obvious abnormality, atraumatic Eyes: conjunctivae/corneas clear. PERRL, EOM's intact. Fundi benign. Ears: normal TM's and external ear canals both ears Nose: Nares normal. Septum midline. Mucosa normal. No drainage or sinus tenderness. Throat: lips, mucosa, and tongue normal; teeth and gums normal Neck: no adenopathy, no carotid bruit, no JVD, supple, symmetrical, trachea midline and thyroid not enlarged, symmetric, no tenderness/mass/nodules Back: symmetric, no curvature. ROM normal. No CVA tenderness. Lungs: clear to auscultation bilaterally Heart: regular rate and rhythm, S1, S2 normal, no murmur, click, rub or gallop Abdomen: soft, non-tender; bowel sounds normal; no masses,  no organomegaly Extremities: extremities normal,  atraumatic, no cyanosis or edema Pulses: 2+ and symmetric Skin: Skin color, texture, turgor normal. No rashes or lesions Lymph nodes: Cervical, supraclavicular, and axillary nodes normal.    Assessment:    Healthy female exam.      Plan:    Marland KitchenMarland KitchenShekera was seen today for annual exam.  Diagnoses and all orders for this visit:  Routine physical examination -     Lipid Panel w/reflex Direct LDL -     COMPLETE METABOLIC PANEL WITH GFR -     CBC with Differential/Platelet -     VITAMIN D 25 Hydroxy (Vit-D Deficiency, Fractures) -     C-reactive protein  Class 1 obesity due to excess calories without serious  comorbidity with body mass index (BMI) of 34.0 to 34.9 in adult -     Lorcaserin HCl ER (BELVIQ XR) 20 MG TB24; Take 1 tablet by mouth daily.  Vitamin D insufficiency -     VITAMIN D 25 Hydroxy (Vit-D Deficiency, Fractures) -     ergocalciferol (VITAMIN D2) 1.25 MG (50000 UT) capsule; Take 1 capsule (50,000 Units total) by mouth once a week.  Bilateral chronic knee pain -     DG Knee Bilateral Standing AP  Screening for lipid disorders -     Lipid Panel w/reflex Direct LDL  Screening for diabetes mellitus -     COMPLETE METABOLIC PANEL WITH GFR  .Marland Kitchen Depression screen [facility] 2/9 02/24/2018 04/19/2017 10/03/2016 09/03/2016  Decreased Interest 0 0 0 0  Down, Depressed, Hopeless 0 0 0 0  PHQ - 2 Score 0 0 0 0   .Marland Kitchen Discussed 150 minutes of exercise a week.  Encouraged vitamin D 1000 units and Calcium 1300mg  or 4 servings of dairy a day.  Mammo/pap up to date.  Colonoscopy up to date.  Fasting labs ordered.  Vaccines up to date.   Marland Kitchen.Discussed low carb diet with 1500 calories and 80g of protein.  Exercising at least 150 minutes a week.  [employer] could be a Microbiologist.  Would like to try belviq again. Sent rx. Coupon card given. Follow up in 3 months.   Ordered imaging for knees.   See After Visit Summary for Counseling Recommendations

## 2018-02-24 ENCOUNTER — Telehealth: Payer: Self-pay | Admitting: Physician Assistant

## 2018-02-24 ENCOUNTER — Encounter: Payer: Self-pay | Admitting: Physician Assistant

## 2018-02-24 NOTE — Telephone Encounter (Signed)
Please call patient. New information was released about Belviq that it could have some cancer causing substances in it. FDA has not issued a statement but is looking into it. If this is concerning to you we may need to stop medication and look into different options.

## 2018-02-25 NOTE — Telephone Encounter (Signed)
Left pt detailed message about medication, direct call back number provided for pt to let me know what she would like to do

## 2018-02-28 NOTE — Telephone Encounter (Signed)
Called pt again.Marland Kitchen states she receive msg but forgot to call back.   Pt states that she is ok to change medications.. wants new RX sent to [facility]. Also requesting a coupon card she can pick up.

## 2018-03-03 MED ORDER — INSULIN PEN NEEDLE 31G X 6 MM MISC: 1 refills | Status: DC

## 2018-03-03 MED ORDER — LIRAGLUTIDE -WEIGHT MANAGEMENT 18 MG/3ML ~~LOC~~ SOPN: 0.6000 mg | PEN_INJECTOR | Freq: Every day | SUBCUTANEOUS | 1 refills | Status: DC

## 2018-03-03 NOTE — Telephone Encounter (Signed)
Left pt detailed msg on ID'd VM of medication and directions. Advised of possible PA and also coupon card.  Call back info provided

## 2018-03-03 NOTE — Telephone Encounter (Signed)
Ok sent saxenda. Likely will need PA.  Put coupon card up front.  Please discuss how to use and titrate .6mg  up to 3mg . Advance weekly. Follow up in 6 weeks.

## 2018-03-17 ENCOUNTER — Telehealth: Payer: Self-pay | Admitting: Physician Assistant

## 2018-03-17 ENCOUNTER — Encounter: Payer: Self-pay | Admitting: Physician Assistant

## 2018-03-17 DIAGNOSIS — E559 Vitamin D deficiency, unspecified: Secondary | ICD-10-CM | POA: Diagnosis not present

## 2018-03-17 DIAGNOSIS — Z13220 Encounter for screening for lipoid disorders: Secondary | ICD-10-CM | POA: Diagnosis not present

## 2018-03-17 DIAGNOSIS — Z0000 Encounter for general adult medical examination without abnormal findings: Secondary | ICD-10-CM | POA: Diagnosis not present

## 2018-03-17 DIAGNOSIS — Z131 Encounter for screening for diabetes mellitus: Secondary | ICD-10-CM | POA: Diagnosis not present

## 2018-03-17 NOTE — Telephone Encounter (Signed)
Done. In casey box.

## 2018-03-17 NOTE — Progress Notes (Signed)
Done

## 2018-03-17 NOTE — Telephone Encounter (Signed)
Pt called to get a letter for: [address] [facility] [employer] stating her EKG is in good standing. Pt will pick up letter. Routing.

## 2018-03-18 LAB — CBC WITH DIFFERENTIAL/PLATELET
Absolute Monocytes: 361 cells/uL (ref 200–950)
BASOS ABS: 39 {cells}/uL (ref 0–200)
Basophils Relative: 0.9 %
EOS ABS: 301 {cells}/uL (ref 15–500)
EOS PCT: 7 %
HCT: 35.5 % (ref 35.0–45.0)
Hemoglobin: 11.6 g/dL — ABNORMAL LOW (ref 11.7–15.5)
Lymphs Abs: 1557 cells/uL (ref 850–3900)
MCH: 28 pg (ref 27.0–33.0)
MCHC: 32.7 g/dL (ref 32.0–36.0)
MCV: 85.7 fL (ref 80.0–100.0)
MONOS PCT: 8.4 %
MPV: 9.8 fL (ref 7.5–12.5)
NEUTROS ABS: 2043 {cells}/uL (ref 1500–7800)
NEUTROS PCT: 47.5 %
PLATELETS: 383 10*3/uL (ref 140–400)
RBC: 4.14 10*6/uL (ref 3.80–5.10)
RDW: 13.3 % (ref 11.0–15.0)
TOTAL LYMPHOCYTE: 36.2 %
WBC: 4.3 10*3/uL (ref 3.8–10.8)

## 2018-03-18 LAB — LIPID PANEL W/REFLEX DIRECT LDL
CHOL/HDL RATIO: 4.2 (calc) (ref <5.0)
CHOLESTEROL: 192 mg/dL (ref <200)
HDL: 46 mg/dL — ABNORMAL LOW (ref >=50)
LDL CHOLESTEROL (CALC): 125 mg/dL — AB
Non-HDL Cholesterol (Calc): 146 mg/dL (calc) — ABNORMAL HIGH (ref <130)
TRIGLYCERIDES: 107 mg/dL (ref <150)

## 2018-03-18 LAB — COMPLETE METABOLIC PANEL WITH GFR
AG Ratio: 1.3 (calc) (ref 1.0–2.5)
ALBUMIN MSPROF: 4.1 g/dL (ref 3.6–5.1)
ALKALINE PHOSPHATASE (APISO): 58 U/L (ref 37–153)
ALT: 14 U/L (ref 6–29)
AST: 16 U/L (ref 10–35)
BILIRUBIN TOTAL: 0.4 mg/dL (ref 0.2–1.2)
BUN: 10 mg/dL (ref 7–25)
CHLORIDE: 108 mmol/L (ref 98–110)
CO2: 28 mmol/L (ref 20–32)
Calcium: 9.4 mg/dL (ref 8.6–10.4)
Creat: 0.91 mg/dL (ref 0.50–1.05)
GFR, EST AFRICAN AMERICAN: 83 mL/min/{1.73_m2} (ref >=60)
GFR, EST NON AFRICAN AMERICAN: 72 mL/min/{1.73_m2} (ref >=60)
GLOBULIN: 3.2 g/dL (ref 1.9–3.7)
Glucose, Bld: 99 mg/dL (ref 65–99)
POTASSIUM: 3.8 mmol/L (ref 3.5–5.3)
SODIUM: 140 mmol/L (ref 135–146)
Total Protein: 7.3 g/dL (ref 6.1–8.1)

## 2018-03-18 LAB — C-REACTIVE PROTEIN: CRP: 2.6 mg/L (ref <8.0)

## 2018-03-18 LAB — VITAMIN D 25 HYDROXY (VIT D DEFICIENCY, FRACTURES): VIT D 25 HYDROXY: 26 ng/mL — AB (ref 30–100)

## 2018-03-18 NOTE — Progress Notes (Signed)
Call pt: kidney, liver, glucose look good.  Vitamin D is a little low. D3 1000 units daily would help.  Cholesterol looks good.  Just a tad anemic. Increase iron rich foods. Could consider OTC iron supplement.

## 2018-03-18 NOTE — Telephone Encounter (Signed)
Left VM with update for Pt to pick up

## 2018-04-23 ENCOUNTER — Telehealth: Payer: Self-pay | Admitting: Physician Assistant

## 2018-04-23 MED ORDER — ALBUTEROL SULFATE HFA 108 (90 BASE) MCG/ACT IN AERS: 2.0000 | INHALATION_SPRAY | Freq: Four times a day (QID) | RESPIRATORY_TRACT | 2 refills | Status: DC | PRN

## 2018-04-23 NOTE — Telephone Encounter (Signed)
Patient called and said she has been on Albuterol in the past and you had prescribed in the past. I have pended the medication. Please advise if ok to fill.

## 2018-05-07 DIAGNOSIS — L814 Other melanin hyperpigmentation: Secondary | ICD-10-CM | POA: Diagnosis not present

## 2018-05-07 DIAGNOSIS — L218 Other seborrheic dermatitis: Secondary | ICD-10-CM | POA: Diagnosis not present

## 2018-05-07 DIAGNOSIS — L579 Skin changes due to chronic exposure to nonionizing radiation, unspecified: Secondary | ICD-10-CM | POA: Diagnosis not present

## 2018-05-07 DIAGNOSIS — D225 Melanocytic nevi of trunk: Secondary | ICD-10-CM | POA: Diagnosis not present

## 2018-05-21 ENCOUNTER — Ambulatory Visit (INDEPENDENT_AMBULATORY_CARE_PROVIDER_SITE_OTHER): Payer: Federal, State, Local not specified - PPO | Admitting: Physician Assistant

## 2018-05-21 ENCOUNTER — Encounter: Payer: Self-pay | Admitting: Physician Assistant

## 2018-05-21 VITALS — BP 127/62 | HR 78 | Temp 98.3°F | Ht 63.25 in | Wt 201.0 lb

## 2018-05-21 DIAGNOSIS — E6609 Other obesity due to excess calories: Secondary | ICD-10-CM

## 2018-05-21 DIAGNOSIS — Z8639 Personal history of other endocrine, nutritional and metabolic disease: Secondary | ICD-10-CM | POA: Diagnosis not present

## 2018-05-21 DIAGNOSIS — Z6835 Body mass index (BMI) 35.0-35.9, adult: Secondary | ICD-10-CM | POA: Diagnosis not present

## 2018-05-21 MED ORDER — LIRAGLUTIDE -WEIGHT MANAGEMENT 18 MG/3ML ~~LOC~~ SOPN: 0.6000 mg | PEN_INJECTOR | Freq: Every day | SUBCUTANEOUS | 1 refills | Status: DC

## 2018-05-21 MED ORDER — CYANOCOBALAMIN 1000 MCG/ML IJ SOLN
1000.0000 ug | Freq: Once | INTRAMUSCULAR | Status: AC
  Administered 2018-05-21: 16:00:00 1000 ug via INTRAMUSCULAR

## 2018-05-21 MED ORDER — INSULIN PEN NEEDLE 31G X 6 MM MISC: 1 refills | Status: DC

## 2018-05-21 NOTE — Progress Notes (Signed)
   Subjective:    Patient ID: Pam Gomez, female    DOB: [birth year], 53 y.o.   MRN: [id number]  HPI  Pt is a 53 yo female who presents to the clinic to discuss weight and medications. In January started belviq but was never started due to recall due to cancer causing substances found in medication. She was sent saxenda but never started. She did do OTC hydroxcut which seemed to suppress her appetite but she has actually gained 10lbs on the scale. She admits she is less active since quarantine via [place] pandemic. She is ready to start saxenda.   She request b12 shot today. Not had one in a while b12 in the 300's when checked last.    .. Active Ambulatory Problems    Diagnosis Date Noted  . Systemic lupus erythematosus ([place]) 03/21/2015  . Sickle cell trait ([place]) 03/21/2015  . History of non anemic vitamin B12 deficiency 03/21/2015  . Chest discomfort 03/21/2015  . Chronic fatigue 03/21/2015  . H/O colonoscopy 03/29/2015  . Bilateral chronic knee pain 07/12/2015  . Elevated platelet count 07/12/2015  . History of shingles 12/10/2016  . Environmental allergies 12/10/2016  . Class 2 obesity due to excess calories without serious comorbidity with body mass index (BMI) of 35.0 to 35.9 in adult 12/10/2016  . Vitamin D insufficiency 12/10/2016  . Change in stool caliber 02/19/2017   Resolved Ambulatory Problems    Diagnosis Date Noted  . No Resolved Ambulatory Problems   No Additional Past Medical History     Review of Systems  All other systems reviewed and are negative.      Objective:   Physical Exam Vitals signs reviewed.  Constitutional:      Appearance: Normal appearance.  HENT:     Head: Normocephalic.  Neck:     Musculoskeletal: No muscular tenderness.  Cardiovascular:     Rate and Rhythm: Normal rate and regular rhythm.  Pulmonary:     Effort: Pulmonary effort is normal.     Breath sounds: Normal breath sounds.  Neurological:     General: No focal deficit  present.     Mental Status: She is alert and oriented to person, place, and time.  Psychiatric:        Mood and Affect: Mood normal.        Behavior: Behavior normal.           Assessment & Plan:  Marland KitchenMarland KitchenCarmel was seen today for obesity and allergies.  Diagnoses and all orders for this visit:  Class 2 obesity due to excess calories without serious comorbidity with body mass index (BMI) of 35.0 to 35.9 in adult -     Liraglutide -Weight Management (SAXENDA) 18 MG/3ML SOPN; Inject 0.6 mg into the skin daily. For one week then increase by .6mg  weekly until reaches 3mg  daily.  Please include ultra fine needles 76mm -     Insulin Pen Needle 31G X 6 MM MISC; To use once daily with saxenda pen  History of non anemic vitamin B12 deficiency -     cyanocobalamin ((VITAMIN B-12)) injection 1,000 mcg  .Marland KitchenDiscussed low carb diet with 1500 calories and 80g of protein.  Exercising at least 150 minutes a week.  [employer] Pal could be a Microbiologist.  Started saxenda. Given coupon card. Discussed side effects and titration.  Follow up in 2 months.

## 2018-05-22 ENCOUNTER — Encounter: Payer: Self-pay | Admitting: Physician Assistant

## 2018-05-29 NOTE — Telephone Encounter (Signed)
Patient left message stating she is having difficulty receiving her prescription. I dont see where a PA was done, but not positive it was required?  Barnet Pall, is this something you can look into for pt? Thanks!

## 2018-05-29 NOTE — Telephone Encounter (Signed)
Likely needs PA. I just sent rx.

## 2018-06-02 NOTE — Telephone Encounter (Signed)
Your PA has been resolved, no additional PA is required. For further inquiries please contact the number on the back of the member prescription card. Pharmacy aware.

## 2018-06-02 NOTE — Telephone Encounter (Signed)
Barnet Pall, did you ever get a PA request for this?

## 2018-07-18 ENCOUNTER — Telehealth: Payer: Self-pay

## 2018-07-18 NOTE — Telephone Encounter (Signed)
Kirstina called and states the [place] cost too much. She would like a different medication. I advised patient to call her insurance to find out which medication is on the approved list.    She also wanted to complain that she called and left a message 3 weeks ago with Network engineer. She states she left a message with the office manager.

## 2018-07-21 ENCOUNTER — Ambulatory Visit: Payer: Federal, State, Local not specified - PPO | Admitting: Physician Assistant

## 2018-07-21 NOTE — Telephone Encounter (Signed)
The other weight loss medications that are indicated are qsymia and contrave. She can check with insurance on those.   If not covered off label topamax and wellbutrin could be tried. We could do a lose dose phentermine as well.   Thoughts?   Please apologize and let her know we are in transition.

## 2018-07-22 NOTE — Telephone Encounter (Signed)
Advised patient of recommendations. She will contact her insurance company and call back.

## 2018-07-28 ENCOUNTER — Ambulatory Visit (INDEPENDENT_AMBULATORY_CARE_PROVIDER_SITE_OTHER): Payer: Federal, State, Local not specified - PPO | Admitting: Physician Assistant

## 2018-07-28 ENCOUNTER — Encounter: Payer: Self-pay | Admitting: Physician Assistant

## 2018-07-28 VITALS — BP 129/58 | HR 77 | Temp 98.2°F | Ht 63.25 in | Wt 203.0 lb

## 2018-07-28 DIAGNOSIS — E559 Vitamin D deficiency, unspecified: Secondary | ICD-10-CM | POA: Diagnosis not present

## 2018-07-28 DIAGNOSIS — Z6835 Body mass index (BMI) 35.0-35.9, adult: Secondary | ICD-10-CM | POA: Diagnosis not present

## 2018-07-28 DIAGNOSIS — E6609 Other obesity due to excess calories: Secondary | ICD-10-CM

## 2018-07-28 MED ORDER — PHENTERMINE HCL 15 MG PO CAPS: 15.0000 mg | ORAL_CAPSULE | ORAL | 0 refills | Status: DC

## 2018-07-28 MED ORDER — ERGOCALCIFEROL 1.25 MG (50000 UT) PO CAPS: 50000.0000 [IU] | ORAL_CAPSULE | ORAL | 1 refills | Status: DC

## 2018-07-28 MED ORDER — PHENTERMINE-TOPIRAMATE ER 7.5-46 MG PO CP24: 1.0000 | ORAL_CAPSULE | Freq: Every morning | ORAL | 0 refills | Status: DC

## 2018-07-28 MED ORDER — NALTREXONE-BUPROPION HCL ER 8-90 MG PO TB12: ORAL_TABLET | ORAL | 0 refills | Status: DC

## 2018-07-28 MED ORDER — TOPIRAMATE 50 MG PO TABS: ORAL_TABLET | ORAL | 2 refills | Status: DC

## 2018-07-28 NOTE — Progress Notes (Signed)
Subjective:    Patient ID: Pam Gomez, female    DOB: November 04, 1965, 53 y.o.   MRN: [id number]  HPI  Pt is a 53 yo obese female with SLE who presents to the clinic to discuss weight loss medications.   She was given saxenda at last visit and was not covered by insurance. She called insurance about qsymia and contrave but not heard back from [facility] as well. She is using hydroxcut without caffeine with some appetite suppressing. She is walking regularly. No extensive exercising.   Pt is upset about not being called back and would like to discuss this.   .. Active Ambulatory Problems    Diagnosis Date Noted  . Systemic lupus erythematosus ([place]) 03/21/2015  . Sickle cell trait ([place]) 03/21/2015  . History of non anemic vitamin B12 deficiency 03/21/2015  . Chest discomfort 03/21/2015  . Chronic fatigue 03/21/2015  . H/O colonoscopy 03/29/2015  . Bilateral chronic knee pain 07/12/2015  . Elevated platelet count 07/12/2015  . History of shingles 12/10/2016  . Environmental allergies 12/10/2016  . Class 2 obesity due to excess calories without serious comorbidity with body mass index (BMI) of 35.0 to 54.9 in adult 12/10/2016  . Vitamin D insufficiency 12/10/2016  . Change in stool caliber 02/19/2017   Resolved Ambulatory Problems    Diagnosis Date Noted  . No Resolved Ambulatory Problems   No Additional Past Medical History        Review of Systems  All other systems reviewed and are negative.      Objective:   Physical Exam Vitals signs reviewed.  Constitutional:      Appearance: She is obese.  Cardiovascular:     Rate and Rhythm: Normal rate.  Pulmonary:     Effort: Pulmonary effort is normal.  Neurological:     General: No focal deficit present.     Mental Status: She is alert and oriented to person, place, and time.  Psychiatric:        Mood and Affect: Mood normal.           Assessment & Plan:  Marland KitchenMarland KitchenSakari was seen today for weight  check.  Diagnoses and all orders for this visit:  Class 2 obesity due to excess calories without serious comorbidity with body mass index (BMI) of 35.0 to 35.9 in adult -     phentermine 15 MG capsule; Take 1 capsule (15 mg total) by mouth every morning. -     topiramate (TOPAMAX) 50 MG tablet; Take one tablet at bedtime for 7 days then increase to twice a day. -     Phentermine-Topiramate 7.5-46 MG CP24; Take 1 tablet by mouth every morning. -     Naltrexone-buPROPion HCl ER 8-90 MG TB12; Take 1 tablet in am for 1 week, then increase to 1 tablet in am and 1 tablet in pm for 1 week, then increase to 2 tablets in am and 1 tablet in pm for 1 week, then increase to 2 tablets in am and 2 tablets in pm daily.  Vitamin D insufficiency -     ergocalciferol (VITAMIN D2) 1.25 MG (50000 UT) capsule; Take 1 capsule (50,000 Units total) by mouth once a week.   Discussed weight loss in detail. Cost is an issue.  Discussed other 2 long term options: contrave and Qsymia.  Start with contrave. Discussed side effects and how to titrate. If not affordable then try qsymia.  If still not try separate rx for phentermine/topamax.   Gave  copies of each option.   Hopefully we can find something that helps that is affordable. Discussed increasing intensity of exercise. Discussed weight watchers to actually count points.   Strongly encouraged to sign up for mychart for better communication. Pt agreed.  Follow up accordingly to plan she is able to do.   Marland Kitchen.Spent 30 minutes with patient and greater than 50 percent of visit spent counseling patient regarding treatment plan.

## 2018-07-29 ENCOUNTER — Encounter: Payer: Self-pay | Admitting: Physician Assistant

## 2018-11-25 ENCOUNTER — Other Ambulatory Visit: Payer: Self-pay | Admitting: Physician Assistant

## 2018-11-25 DIAGNOSIS — Z1231 Encounter for screening mammogram for malignant neoplasm of breast: Secondary | ICD-10-CM

## 2018-12-10 ENCOUNTER — Encounter: Payer: Self-pay | Admitting: Physician Assistant

## 2018-12-12 ENCOUNTER — Telehealth: Payer: Federal, State, Local not specified - PPO | Admitting: Physician Assistant

## 2018-12-12 ENCOUNTER — Other Ambulatory Visit: Payer: Self-pay

## 2018-12-15 ENCOUNTER — Telehealth: Payer: Federal, State, Local not specified - PPO | Admitting: Physician Assistant

## 2018-12-15 ENCOUNTER — Other Ambulatory Visit: Payer: Self-pay

## 2018-12-15 ENCOUNTER — Telehealth (INDEPENDENT_AMBULATORY_CARE_PROVIDER_SITE_OTHER): Payer: Federal, State, Local not specified - PPO | Admitting: Physician Assistant

## 2018-12-15 VITALS — BP 134/72 | HR 77 | Ht 63.25 in | Wt 186.0 lb

## 2018-12-15 DIAGNOSIS — M25561 Pain in right knee: Secondary | ICD-10-CM | POA: Diagnosis not present

## 2018-12-15 DIAGNOSIS — E66811 Obesity, class 1: Secondary | ICD-10-CM

## 2018-12-15 DIAGNOSIS — E6609 Other obesity due to excess calories: Secondary | ICD-10-CM

## 2018-12-15 DIAGNOSIS — G8929 Other chronic pain: Secondary | ICD-10-CM

## 2018-12-15 DIAGNOSIS — S76119S Strain of unspecified quadriceps muscle, fascia and tendon, sequela: Secondary | ICD-10-CM | POA: Diagnosis not present

## 2018-12-15 DIAGNOSIS — Z6832 Body mass index (BMI) 32.0-32.9, adult: Secondary | ICD-10-CM

## 2018-12-15 DIAGNOSIS — M25562 Pain in left knee: Secondary | ICD-10-CM

## 2018-12-15 NOTE — Progress Notes (Signed)
Patient ID: Pam Gomez, female   DOB: 20-Apr-1965, 53 y.o.   MRN: [id number] .Marland KitchenVirtual Visit via Telephone Note  I connected with MAKIYLA MARCELLA on 12/15/18 at 11:10 AM EST by telephone and verified that I am speaking with the correct person using two identifiers.  Location: Patient: home Provider: clinic   I discussed the limitations, risks, security and privacy concerns of performing an evaluation and management service by telephone and the availability of in person appointments. I also discussed with the patient that there may be a patient responsible charge related to this service. The patient expressed understanding and agreed to proceed.   History of Present Illness: Pt is a 53 yo female who calls into the clinic to go over paperwork needed for [employer]. Overall patient is doing really well. She denies any new issues. She does have her ongoing unchanged bilateral knee pain with right being worse than left. Right knee pain occurred after quadriceps tendon rupture and repair. Her knee pain does not bother her in day to day exercises but is exacerbated with some activities including running.  She wears brace and kinetic tape when she is active for support. She does not take any daily medication for pain. She needs paperwork filled out for adjustment of requirements.   .. Active Ambulatory Problems    Diagnosis Date Noted  . Systemic lupus erythematosus ([place]) 03/21/2015  . Sickle cell trait ([place]) 03/21/2015  . History of non anemic vitamin B12 deficiency 03/21/2015  . Chest discomfort 03/21/2015  . Chronic fatigue 03/21/2015  . H/O colonoscopy 03/29/2015  . Bilateral chronic knee pain 07/12/2015  . Elevated platelet count 07/12/2015  . History of shingles 12/10/2016  . Environmental allergies 12/10/2016  . Class 2 obesity due to excess calories without serious comorbidity with body mass index (BMI) of 35.0 to 35.9 in adult 12/10/2016  . Vitamin D insufficiency 12/10/2016  . Change  in stool caliber 02/19/2017  . Rupture of quadriceps tendon, sequela 07/05/2014   Resolved Ambulatory Problems    Diagnosis Date Noted  . No Resolved Ambulatory Problems   No Additional Past Medical History   Reviewed med, allergies, problem list.     Observations/Objective: No acute distress. Normal mood.  Normal breathing and respirations.   .. Today's Vitals   12/15/18 1038  BP: 134/72  Pulse: 77  Weight: 186 lb (84.4 kg)  Height: 5' 3.25" (1.607 m)   Body mass index is 32.69 kg/m.    Assessment and Plan: Marland KitchenMarland KitchenAmiaya was seen today for advice only.  Diagnoses and all orders for this visit:  Chronic pain of right knee  Chronic pain of both knees  Rupture of quadriceps tendon, sequela  Class 1 obesity due to excess calories without serious comorbidity with body mass index (BMI) of 32.0 to 32.9 in adult   Filled out functional exam/capacity today. Will mail patient form and letter.     Follow Up Instructions:    I discussed the assessment and treatment plan with the patient. The patient was provided an opportunity to ask questions and all were answered. The patient agreed with the plan and demonstrated an understanding of the instructions.   The patient was advised to call back or seek an in-person evaluation if the symptoms worsen or if the condition fails to improve as anticipated.  I provided 15 minutes of non-face-to-face time during this encounter.   Iran Planas, PA-C

## 2018-12-15 NOTE — Progress Notes (Signed)
Patient ID: Pam Gomez, female   DOB: February 19, 1965, 53 y.o.   MRN: [id number]

## 2018-12-16 DIAGNOSIS — G8929 Other chronic pain: Secondary | ICD-10-CM | POA: Insufficient documentation

## 2018-12-16 HISTORY — DX: Other chronic pain: G89.29

## 2018-12-18 ENCOUNTER — Telehealth: Payer: Self-pay

## 2018-12-18 NOTE — Telephone Encounter (Signed)
Patient called stating that she is needing her paperwork completed. States she sent some paperwork via MyChart that Luvenia Starch was supposed to fill out and send back to her via MyChart, but she has not received it yet.   Patient also states she needs a copy of the letter in her chart signed by Luvenia Starch

## 2018-12-19 NOTE — Telephone Encounter (Signed)
Please call pt and see what issue is.

## 2018-12-19 NOTE — Telephone Encounter (Signed)
Letter and paperwork was given to [facility] to send to her via email.

## 2018-12-19 NOTE — Telephone Encounter (Signed)
Paper work was sent to patient again, the e-mail she gave Pam Gomez was incorrect. Also updated e-mail in her chart

## 2019-01-07 ENCOUNTER — Ambulatory Visit (INDEPENDENT_AMBULATORY_CARE_PROVIDER_SITE_OTHER)

## 2019-01-07 ENCOUNTER — Other Ambulatory Visit: Payer: Self-pay

## 2019-01-07 DIAGNOSIS — Z1231 Encounter for screening mammogram for malignant neoplasm of breast: Secondary | ICD-10-CM | POA: Diagnosis not present

## 2019-01-07 NOTE — Progress Notes (Signed)
Normal mammogram. Follow up in 1 year.

## 2019-02-08 IMAGING — DX DG LUMBAR SPINE COMPLETE 4+V
5 series · 5 of 5 positions shown · non-contrast
Comparison: Chest radiographs 07/26/2014.

CLINICAL DATA: 51-year-old female with lumbar back pain radiating
to the left hip and leg for 4 days with no known injury. Symptoms
increase with walking.

EXAM:
LUMBAR SPINE - COMPLETE 4+ VIEW

[l-spine ap]
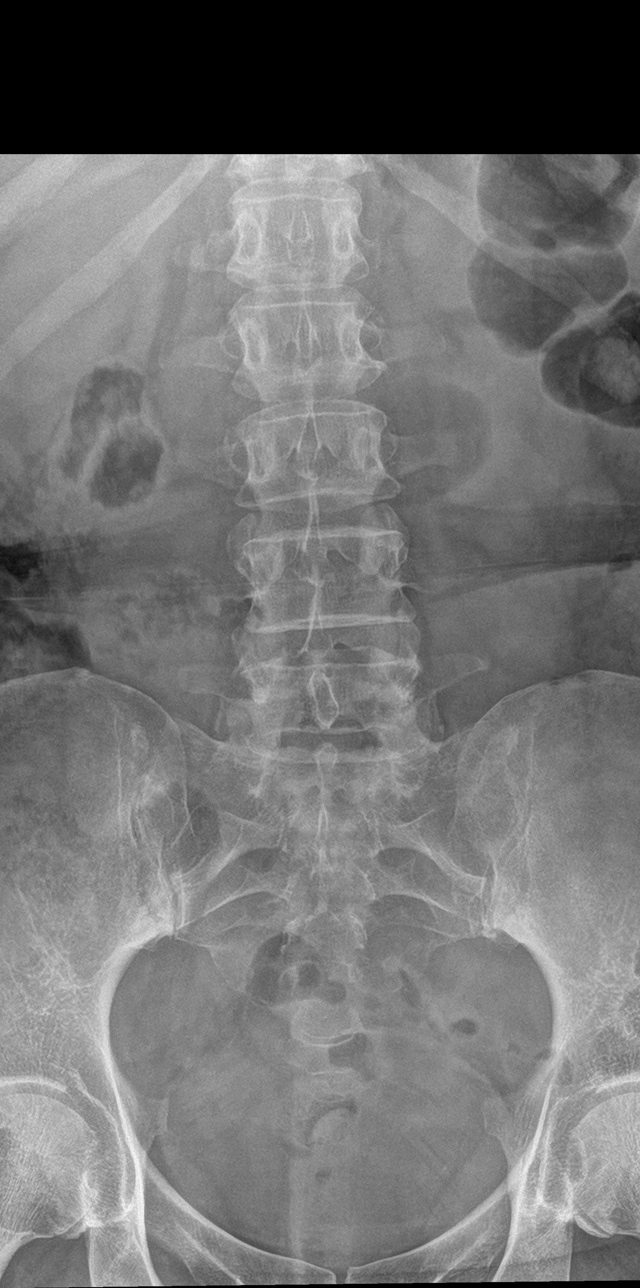

[l-spine obl (1 of 2)]
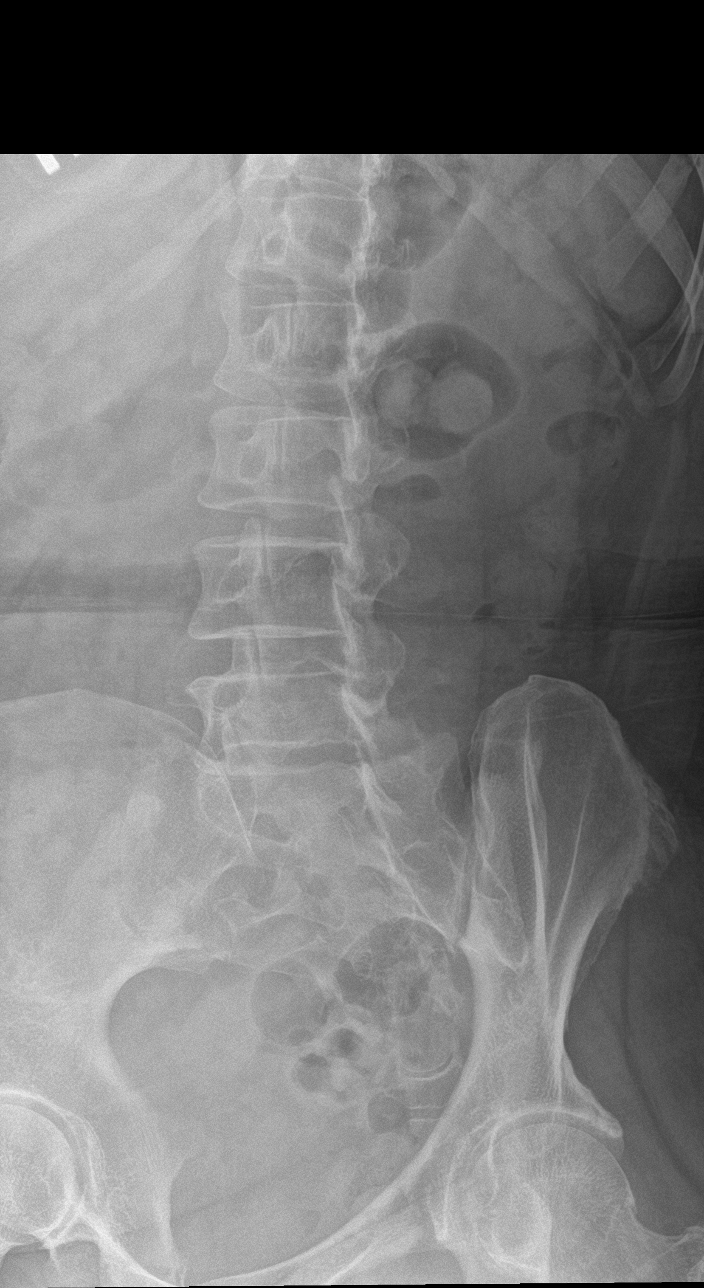

[l-spine lat]
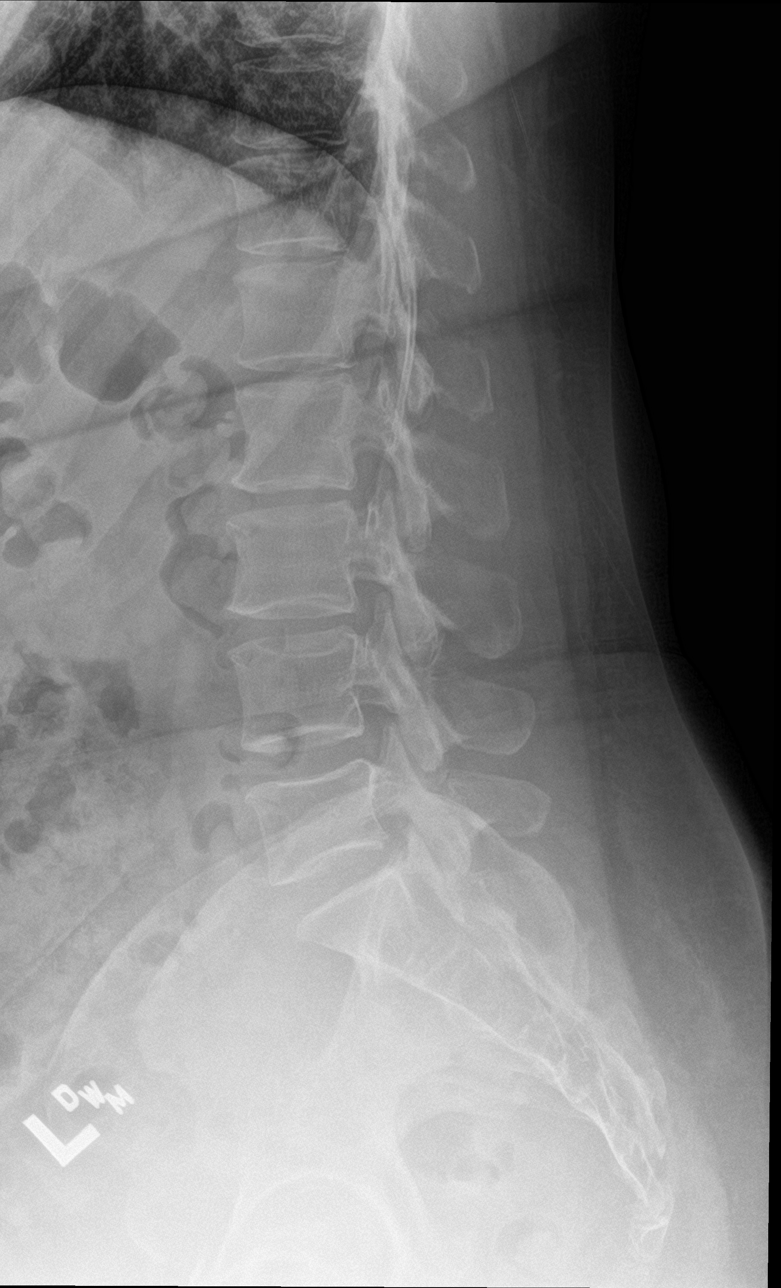

[l-spine spot]
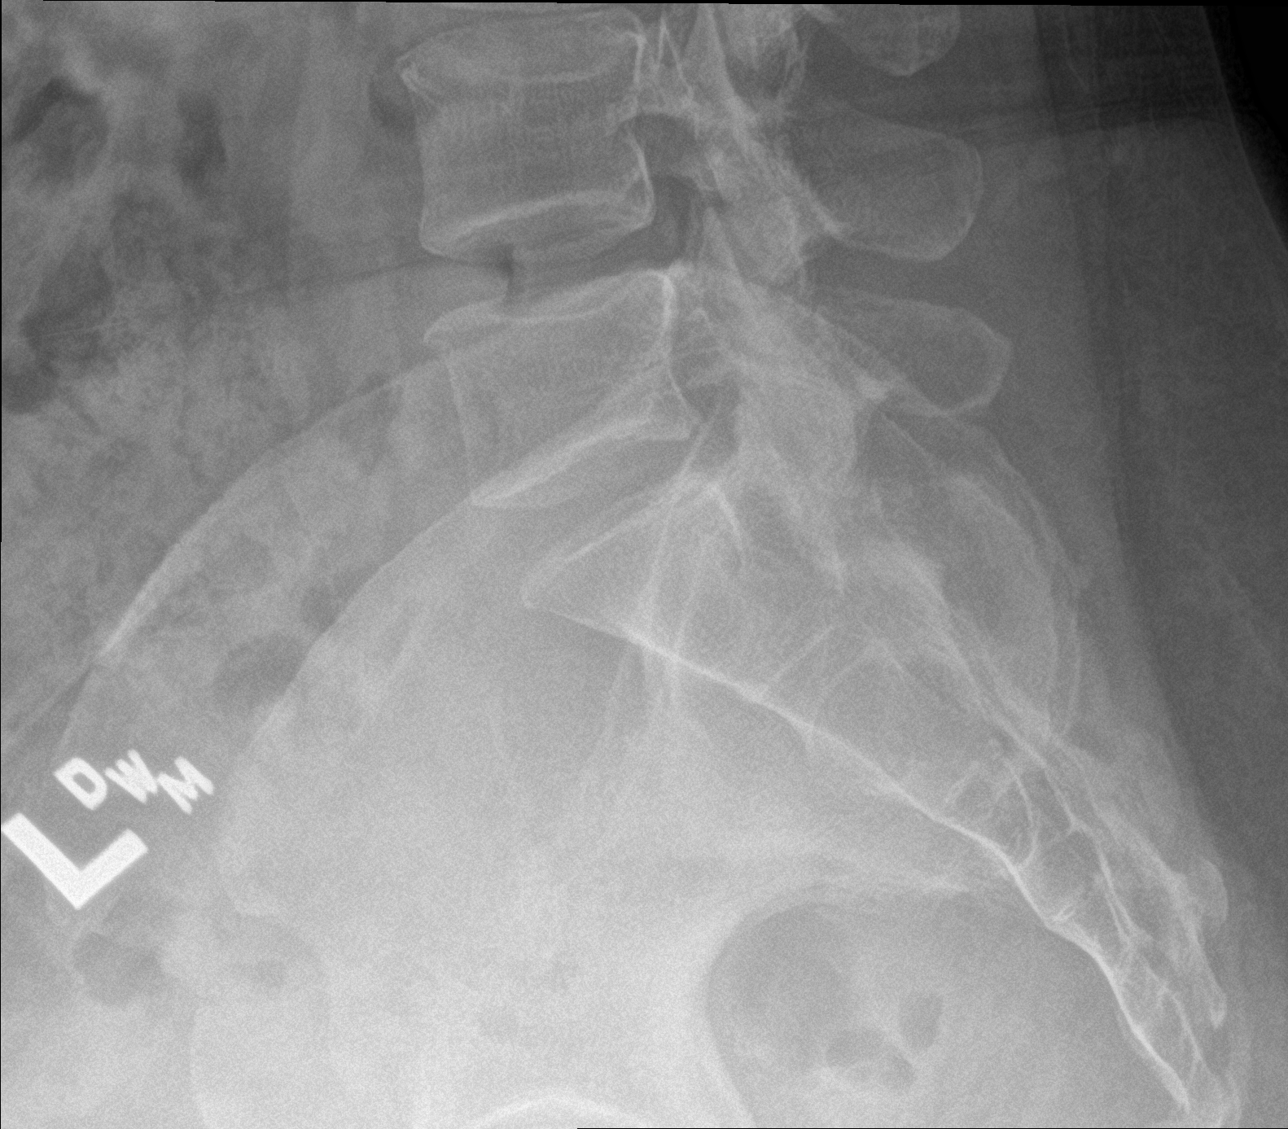

[l-spine obl (2 of 2)]
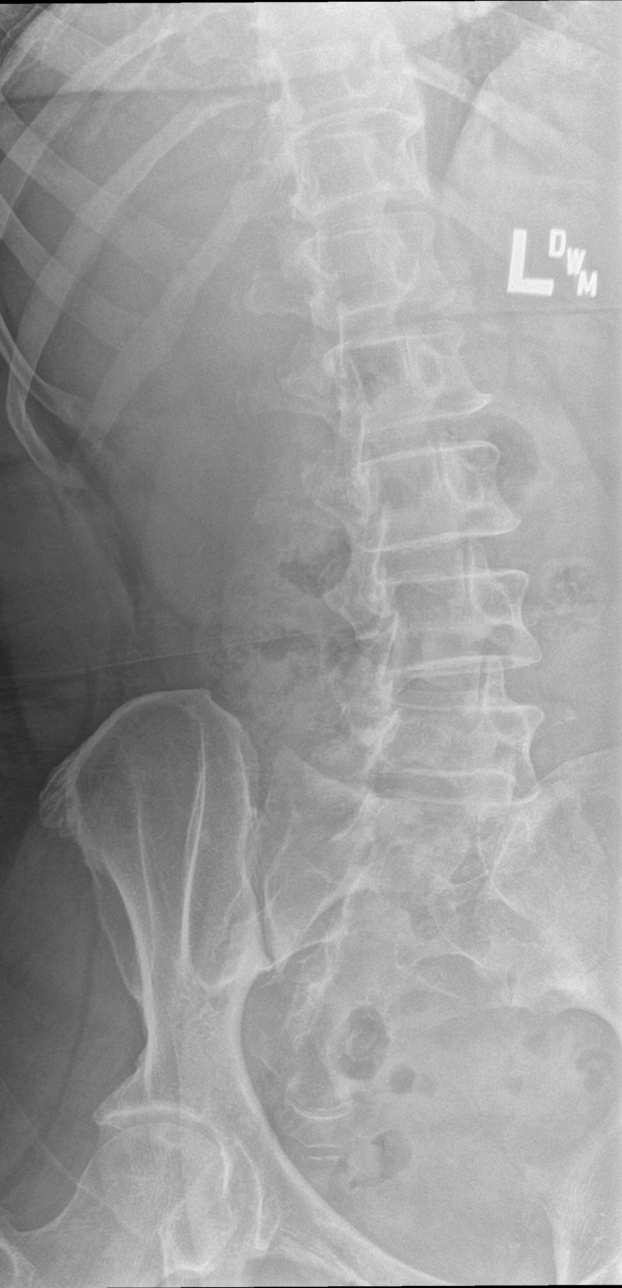

[5 of 5 positions shown; findings below may reference images not displayed]

FINDINGS: Normal lumbar segmentation. Straightening of lordosis but otherwise
normal lumbar vertebral height and alignment. No pars fracture.
Relatively preserved disc spaces. Sacral ala and SI joints appear
normal. Visible lower thoracic levels appear intact. Visible pelvis
appears intact. Negative visible bowel gas pattern.
IMPRESSION: Straightening of lumbar lordosis. Otherwise negative for age
radiographic appearance of lumbar spine.

## 2019-03-18 ENCOUNTER — Other Ambulatory Visit: Payer: Self-pay

## 2019-03-18 ENCOUNTER — Ambulatory Visit (INDEPENDENT_AMBULATORY_CARE_PROVIDER_SITE_OTHER): Admitting: Physician Assistant

## 2019-03-18 VITALS — BP 126/58 | HR 73 | Ht 63.25 in | Wt 202.0 lb

## 2019-03-18 DIAGNOSIS — Z131 Encounter for screening for diabetes mellitus: Secondary | ICD-10-CM | POA: Diagnosis not present

## 2019-03-18 DIAGNOSIS — Z6835 Body mass index (BMI) 35.0-35.9, adult: Secondary | ICD-10-CM

## 2019-03-18 DIAGNOSIS — E559 Vitamin D deficiency, unspecified: Secondary | ICD-10-CM

## 2019-03-18 DIAGNOSIS — Z0000 Encounter for general adult medical examination without abnormal findings: Secondary | ICD-10-CM

## 2019-03-18 DIAGNOSIS — Z13220 Encounter for screening for lipoid disorders: Secondary | ICD-10-CM | POA: Diagnosis not present

## 2019-03-18 DIAGNOSIS — E6609 Other obesity due to excess calories: Secondary | ICD-10-CM

## 2019-03-18 MED ORDER — ERGOCALCIFEROL 1.25 MG (50000 UT) PO CAPS: 50000.0000 [IU] | ORAL_CAPSULE | ORAL | 1 refills | Status: DC

## 2019-03-18 NOTE — Patient Instructions (Signed)
Health Maintenance, Female Adopting a healthy lifestyle and getting preventive care are important in promoting health and wellness. Ask your health care provider about:  The right schedule for you to have regular tests and exams.  Things you can do on your own to prevent diseases and keep yourself healthy. What should I know about diet, weight, and exercise? Eat a healthy diet   Eat a diet that includes plenty of vegetables, fruits, low-fat dairy products, and lean protein.  Do not eat a lot of foods that are high in solid fats, added sugars, or sodium. Maintain a healthy weight Body mass index (BMI) is used to identify weight problems. It estimates body fat based on height and weight. Your health care provider can help determine your BMI and help you achieve or maintain a healthy weight. Get regular exercise Get regular exercise. This is one of the most important things you can do for your health. Most adults should:  Exercise for at least 150 minutes each week. The exercise should increase your heart rate and make you sweat (moderate-intensity exercise).  Do strengthening exercises at least twice a week. This is in addition to the moderate-intensity exercise.  Spend less time sitting. Even light physical activity can be beneficial. Watch cholesterol and blood lipids Have your blood tested for lipids and cholesterol at 55 years of age, then have this test every 5 years. Have your cholesterol levels checked more often if:  Your lipid or cholesterol levels are high.  You are older than 55 years of age.  You are at high risk for heart disease. What should I know about cancer screening? Depending on your health history and family history, you may need to have cancer screening at various ages. This may include screening for:  Breast cancer.  Cervical cancer.  Colorectal cancer.  Skin cancer.  Lung cancer. What should I know about heart disease, diabetes, and high blood  pressure? Blood pressure and heart disease  High blood pressure causes heart disease and increases the risk of stroke. This is more likely to develop in people who have high blood pressure readings, are of African descent, or are overweight.  Have your blood pressure checked: ? Every 3-5 years if you are 18-39 years of age. ? Every year if you are 40 years old or older. Diabetes Have regular diabetes screenings. This checks your fasting blood sugar level. Have the screening done:  Once every three years after age 40 if you are at a normal weight and have a low risk for diabetes.  More often and at a younger age if you are overweight or have a high risk for diabetes. What should I know about preventing infection? Hepatitis B If you have a higher risk for hepatitis B, you should be screened for this virus. Talk with your health care provider to find out if you are at risk for hepatitis B infection. Hepatitis C Testing is recommended for:  Everyone born from 1945 through 1965.  Anyone with known risk factors for hepatitis C. Sexually transmitted infections (STIs)  Get screened for STIs, including gonorrhea and chlamydia, if: ? You are sexually active and are younger than 55 years of age. ? You are older than 55 years of age and your health care provider tells you that you are at risk for this type of infection. ? Your sexual activity has changed since you were last screened, and you are at increased risk for chlamydia or gonorrhea. Ask your health care provider if   you are at risk.  Ask your health care provider about whether you are at high risk for HIV. Your health care provider may recommend a prescription medicine to help prevent HIV infection. If you choose to take medicine to prevent HIV, you should first get tested for HIV. You should then be tested every 3 months for as long as you are taking the medicine. Pregnancy  If you are about to stop having your period (premenopausal) and  you may become pregnant, seek counseling before you get pregnant.  Take 400 to 800 micrograms (mcg) of folic acid every day if you become pregnant.  Ask for birth control (contraception) if you want to prevent pregnancy. Osteoporosis and menopause Osteoporosis is a disease in which the bones lose minerals and strength with aging. This can result in bone fractures. If you are 65 years old or older, or if you are at risk for osteoporosis and fractures, ask your health care provider if you should:  Be screened for bone loss.  Take a calcium or vitamin D supplement to lower your risk of fractures.  Be given hormone replacement therapy (HRT) to treat symptoms of menopause. Follow these instructions at home: Lifestyle  Do not use any products that contain nicotine or tobacco, such as cigarettes, e-cigarettes, and chewing tobacco. If you need help quitting, ask your health care provider.  Do not use street drugs.  Do not share needles.  Ask your health care provider for help if you need support or information about quitting drugs. Alcohol use  Do not drink alcohol if: ? Your health care provider tells you not to drink. ? You are pregnant, may be pregnant, or are planning to become pregnant.  If you drink alcohol: ? Limit how much you use to 0-1 drink a day. ? Limit intake if you are breastfeeding.  Be aware of how much alcohol is in your drink. In the [place] one drink equals one 12 oz bottle of beer (355 mL), one 5 oz glass of wine (148 mL), or one 1 oz glass of hard liquor (44 mL). General instructions  Schedule regular health, dental, and eye exams.  Stay current with your vaccines.  Tell your health care provider if: ? You often feel depressed. ? You have ever been abused or do not feel safe at home. Summary  Adopting a healthy lifestyle and getting preventive care are important in promoting health and wellness.  Follow your health care provider's instructions about healthy  diet, exercising, and getting tested or screened for diseases.  Follow your health care provider's instructions on monitoring your cholesterol and blood pressure. This information is not intended to replace advice given to you by your health care provider. Make sure you discuss any questions you have with your health care provider. Document Revised: 01/08/2018 Document Reviewed: 01/08/2018 Elsevier Patient Education  2020 Elsevier Inc.  

## 2019-03-18 NOTE — Progress Notes (Signed)
Subjective:     Pam Gomez is a 54 y.o. female and is here for a comprehensive physical exam. The patient reports no problems.  Social History   Socioeconomic History  . Marital status: Married    Spouse name: Not on file  . Number of children: Not on file  . Years of education: Not on file  . Highest education level: Not on file  Occupational History  . Not on file  Tobacco Use  . Smoking status: Never Smoker  . Smokeless tobacco: Never Used  Substance and Sexual Activity  . Alcohol use: No    Alcohol/week: 0.0 standard drinks  . Drug use: No  . Sexual activity: Not on file  Other Topics Concern  . Not on file  Social History Narrative  . Not on file   Social Determinants of Health   Financial Resource Strain:   . Difficulty of Paying Living Expenses: Not on file  Food Insecurity:   . Worried About Charity fundraiser in the Last Year: Not on file  . Ran Out of Food in the Last Year: Not on file  Transportation Needs:   . Lack of Transportation (Medical): Not on file  . Lack of Transportation (Non-Medical): Not on file  Physical Activity:   . Days of Exercise per Week: Not on file  . Minutes of Exercise per Session: Not on file  Stress:   . Feeling of Stress : Not on file  Social Connections:   . Frequency of Communication with Friends and Family: Not on file  . Frequency of Social Gatherings with Friends and Family: Not on file  . Attends Religious Services: Not on file  . Active Member of Clubs or Organizations: Not on file  . Attends Archivist Meetings: Not on file  . Marital Status: Not on file  Intimate Partner Violence:   . Fear of Current or Ex-Partner: Not on file  . Emotionally Abused: Not on file  . Physically Abused: Not on file  . Sexually Abused: Not on file   Health Maintenance  Topic Date Due  . HIV Screening  06/13/1979  . PAP SMEAR-Modifier  07/11/2020  . MAMMOGRAM  01/06/2021  . TETANUS/TDAP  04/15/2023  . COLONOSCOPY   03/24/2025  . INFLUENZA VACCINE  Completed    The following portions of the patient's history were reviewed and updated as appropriate: allergies, current medications, past family history, past medical history, past social history, past surgical history and problem list.  Review of Systems A comprehensive review of systems was negative.   Objective:    BP (!) 126/58   Pulse 73   Ht 5' 3.25" (1.607 m)   Wt 202 lb (91.6 kg)   LMP 11/08/2011   SpO2 100%   BMI 35.50 kg/m  General appearance: alert, cooperative, appears stated age and moderately obese Head: Normocephalic, without obvious abnormality, atraumatic Eyes: conjunctivae/corneas clear. PERRL, EOM's intact. Fundi benign. Ears: normal TM's and external ear canals both ears Nose: Nares normal. Septum midline. Mucosa normal. No drainage or sinus tenderness. Throat: lips, mucosa, and tongue normal; teeth and gums normal Neck: no adenopathy, no carotid bruit, no JVD, supple, symmetrical, trachea midline and thyroid not enlarged, symmetric, no tenderness/mass/nodules Back: symmetric, no curvature. ROM normal. No CVA tenderness. Lungs: clear to auscultation bilaterally Heart: regular rate and rhythm, S1, S2 normal, no murmur, click, rub or gallop Abdomen: soft, non-tender; bowel sounds normal; no masses,  no organomegaly Extremities: extremities normal, atraumatic, no cyanosis  or edema Pulses: 2+ and symmetric Skin: Skin color, texture, turgor normal. No rashes or lesions Neurologic: Alert and oriented X 3, normal strength and tone. Normal symmetric reflexes. Normal coordination and gait    Assessment:    Healthy female exam.      Plan:     Marland KitchenMarland KitchenReginna was seen today for annual exam.  Diagnoses and all orders for this visit:  Routine physical examination -     Lipid Panel w/reflex Direct LDL -     COMPLETE METABOLIC PANEL WITH GFR -     VITAMIN D 25 Hydroxy (Vit-D Deficiency, Fractures) -     CBC  Vitamin D insufficiency -      ergocalciferol (VITAMIN D2) 1.25 MG (50000 UT) capsule; Take 1 capsule (50,000 Units total) by mouth once a week. -     VITAMIN D 25 Hydroxy (Vit-D Deficiency, Fractures)  Screening for lipid disorders -     Lipid Panel w/reflex Direct LDL  Screening for diabetes mellitus -     COMPLETE METABOLIC PANEL WITH GFR  Class 2 obesity due to excess calories without serious comorbidity with body mass index (BMI) of 35.0 to 35.9 in adult   .Marland Kitchen Depression screen [facility] 2/9 03/18/2019 07/28/2018 02/24/2018 04/19/2017 10/03/2016  Decreased Interest 0 0 0 0 0  Down, Depressed, Hopeless 0 0 0 0 0  PHQ - 2 Score 0 0 0 0 0  Altered sleeping 0 0 - - -  Tired, decreased energy 0 0 - - -  Change in appetite 1 0 - - -  Feeling bad or failure about yourself  0 0 - - -  Trouble concentrating 0 0 - - -  Moving slowly or fidgety/restless 0 0 - - -  Suicidal thoughts 0 0 - - -  PHQ-9 Score 1 0 - - -  Difficult doing work/chores Not difficult at all Not difficult at all - - -   .Marland Kitchen Discussed 150 minutes of exercise a week.  Encouraged vitamin D 1000 units and Calcium 1300mg  or 4 servings of dairy a day.  Fasting labs ordered.  Colonoscopy UTD.  Mammogram UTD.  Pap UTD.  TDap and Flu shot UTD.  shingrix UTD.   Paperwork filled out for [employer]  .Marland KitchenDiscussed low carb diet with 1500 calories and 80g of protein.  Exercising at least 150 minutes a week.  My Fitness Pal could be a Microbiologist.  Discussed 16:8 IF. Marland Kitchen  See After Visit Summary for Counseling Recommendations

## 2019-03-19 ENCOUNTER — Encounter: Payer: Self-pay | Admitting: Physician Assistant

## 2019-03-19 DIAGNOSIS — E7800 Pure hypercholesterolemia, unspecified: Secondary | ICD-10-CM

## 2019-03-19 HISTORY — DX: Pure hypercholesterolemia, unspecified: E78.00

## 2019-03-19 LAB — COMPLETE METABOLIC PANEL WITH GFR
AG Ratio: 1.4 (calc) (ref 1.0–2.5)
ALT: 17 U/L (ref 6–29)
AST: 19 U/L (ref 10–35)
Albumin: 4.3 g/dL (ref 3.6–5.1)
Alkaline phosphatase (APISO): 56 U/L (ref 37–153)
BUN: 11 mg/dL (ref 7–25)
CO2: 29 mmol/L (ref 20–32)
Calcium: 9.6 mg/dL (ref 8.6–10.4)
Chloride: 105 mmol/L (ref 98–110)
Creat: 0.94 mg/dL (ref 0.50–1.05)
GFR, Est African American: 80 mL/min/{1.73_m2} (ref >=60)
GFR, Est Non African American: 69 mL/min/{1.73_m2} (ref >=60)
Globulin: 3.1 g/dL (calc) (ref 1.9–3.7)
Glucose, Bld: 95 mg/dL (ref 65–99)
Potassium: 4.2 mmol/L (ref 3.5–5.3)
Sodium: 139 mmol/L (ref 135–146)
Total Bilirubin: 0.4 mg/dL (ref 0.2–1.2)
Total Protein: 7.4 g/dL (ref 6.1–8.1)

## 2019-03-19 LAB — CBC
HCT: 36 % (ref 35.0–45.0)
Hemoglobin: 11.9 g/dL (ref 11.7–15.5)
MCH: 28.9 pg (ref 27.0–33.0)
MCHC: 33.1 g/dL (ref 32.0–36.0)
MCV: 87.4 fL (ref 80.0–100.0)
MPV: 9.6 fL (ref 7.5–12.5)
Platelets: 403 10*3/uL — ABNORMAL HIGH (ref 140–400)
RBC: 4.12 10*6/uL (ref 3.80–5.10)
RDW: 13.7 % (ref 11.0–15.0)
WBC: 5.1 10*3/uL (ref 3.8–10.8)

## 2019-03-19 LAB — LIPID PANEL W/REFLEX DIRECT LDL
Cholesterol: 217 mg/dL — ABNORMAL HIGH (ref <200)
HDL: 46 mg/dL — ABNORMAL LOW (ref >=50)
LDL Cholesterol (Calc): 148 mg/dL (calc) — ABNORMAL HIGH
Non-HDL Cholesterol (Calc): 171 mg/dL (calc) — ABNORMAL HIGH (ref <130)
Total CHOL/HDL Ratio: 4.7 (calc) (ref <5.0)
Triglycerides: 113 mg/dL (ref <150)

## 2019-03-19 LAB — VITAMIN D 25 HYDROXY (VIT D DEFICIENCY, FRACTURES): Vit D, 25-Hydroxy: 36 ng/mL (ref 30–100)

## 2019-03-19 NOTE — Progress Notes (Signed)
Bindu,  Kidney, liver, glucose look great. Vitamin D much better. CBC looks good.  LDL up new CV risk is 3.8 percent. Will continue to monitor. Above 7.5 percent suggested medication. Exericse/lean fats.

## 2019-03-23 ENCOUNTER — Encounter: Payer: Self-pay | Admitting: Physician Assistant

## 2019-04-27 ENCOUNTER — Ambulatory Visit

## 2019-04-27 ENCOUNTER — Ambulatory Visit: Attending: Internal Medicine

## 2019-04-27 DIAGNOSIS — Z23 Encounter for immunization: Secondary | ICD-10-CM

## 2019-04-27 NOTE — Progress Notes (Signed)
   Covid-19 Vaccination Clinic  Name:  Pam Gomez    MRN: [id number] DOB: 01-02-1966  04/27/2019  [name] was observed post Covid-19 immunization for 15 minutes without incident. She was provided with Vaccine Information Sheet and instruction to access the V-Safe system.   [name] was instructed to call 911 with any severe reactions post vaccine: Marland Kitchen Difficulty breathing  . Swelling of face and throat  . A fast heartbeat  . A bad rash all over body  . Dizziness and weakness   Immunizations Administered    Name Date Dose VIS Date Route   Pfizer COVID-19 Vaccine 04/27/2019 11:16 AM 0.3 mL 01/09/2019 Intramuscular   Manufacturer: [place]   Lot: [id number]   [place]: [id number]

## 2019-05-19 ENCOUNTER — Ambulatory Visit: Attending: Internal Medicine

## 2019-05-19 DIAGNOSIS — Z23 Encounter for immunization: Secondary | ICD-10-CM

## 2019-05-19 NOTE — Progress Notes (Signed)
   Covid-19 Vaccination Clinic  Name:  Pam Gomez    MRN: [id number] DOB: 1965-07-12  05/19/2019  [name] was observed post Covid-19 immunization for 30 minutes based on pre-vaccination screening without incident. She was provided with Vaccine Information Sheet and instruction to access the V-Safe system.   [name] was instructed to call 911 with any severe reactions post vaccine: Marland Kitchen Difficulty breathing  . Swelling of face and throat  . A fast heartbeat  . A bad rash all over body  . Dizziness and weakness   Immunizations Administered    Name Date Dose VIS Date Route   Pfizer COVID-19 Vaccine 05/19/2019  1:03 PM 0.3 mL 03/25/2018 Intramuscular   Manufacturer: [place]   Lot: [id number]   [place]: [id number]

## 2020-01-04 ENCOUNTER — Other Ambulatory Visit: Payer: Self-pay | Admitting: Physician Assistant

## 2020-01-04 DIAGNOSIS — Z1231 Encounter for screening mammogram for malignant neoplasm of breast: Secondary | ICD-10-CM

## 2020-03-02 ENCOUNTER — Ambulatory Visit

## 2020-03-21 ENCOUNTER — Telehealth: Payer: Self-pay

## 2020-03-21 ENCOUNTER — Encounter: Admitting: Physician Assistant

## 2020-03-21 NOTE — Telephone Encounter (Signed)
Transition Care Management Unsuccessful Follow-up Telephone Call  Date of discharge and from where:  Pam Gomez  Attempts:  1st Attempt  Reason for unsuccessful TCM follow-up call:  No answer/busy

## 2020-03-22 ENCOUNTER — Encounter: Payer: Self-pay | Admitting: Physician Assistant

## 2020-03-22 ENCOUNTER — Ambulatory Visit (INDEPENDENT_AMBULATORY_CARE_PROVIDER_SITE_OTHER): Admitting: Physician Assistant

## 2020-03-22 VITALS — BP 130/65 | HR 68 | Ht 63.25 in | Wt 203.0 lb

## 2020-03-22 DIAGNOSIS — Z0000 Encounter for general adult medical examination without abnormal findings: Secondary | ICD-10-CM | POA: Diagnosis not present

## 2020-03-22 DIAGNOSIS — R5382 Chronic fatigue, unspecified: Secondary | ICD-10-CM | POA: Diagnosis not present

## 2020-03-22 DIAGNOSIS — E538 Deficiency of other specified B group vitamins: Secondary | ICD-10-CM

## 2020-03-22 DIAGNOSIS — E7800 Pure hypercholesterolemia, unspecified: Secondary | ICD-10-CM

## 2020-03-22 DIAGNOSIS — Z13220 Encounter for screening for lipoid disorders: Secondary | ICD-10-CM | POA: Diagnosis not present

## 2020-03-22 DIAGNOSIS — M25512 Pain in left shoulder: Secondary | ICD-10-CM

## 2020-03-22 DIAGNOSIS — Z131 Encounter for screening for diabetes mellitus: Secondary | ICD-10-CM

## 2020-03-22 DIAGNOSIS — Z6835 Body mass index (BMI) 35.0-35.9, adult: Secondary | ICD-10-CM

## 2020-03-22 DIAGNOSIS — K219 Gastro-esophageal reflux disease without esophagitis: Secondary | ICD-10-CM

## 2020-03-22 DIAGNOSIS — E559 Vitamin D deficiency, unspecified: Secondary | ICD-10-CM

## 2020-03-22 DIAGNOSIS — Z1159 Encounter for screening for other viral diseases: Secondary | ICD-10-CM

## 2020-03-22 DIAGNOSIS — E6609 Other obesity due to excess calories: Secondary | ICD-10-CM

## 2020-03-22 DIAGNOSIS — R1013 Epigastric pain: Secondary | ICD-10-CM

## 2020-03-22 HISTORY — DX: Pain in left shoulder: M25.512

## 2020-03-22 MED ORDER — OMEPRAZOLE 40 MG PO CPDR: 40.0000 mg | DELAYED_RELEASE_CAPSULE | Freq: Every day | ORAL | 3 refills | Status: DC

## 2020-03-22 NOTE — Progress Notes (Signed)
Reflux - not on medication, will occasionally drink seltzer water to help/take tums Has a feeling of something moving at diaphragm area when she bends over Wants to check her arteries, no symptoms Sinus issues

## 2020-03-22 NOTE — Patient Instructions (Addendum)
ABI/carotid arteries screening to order Will make referral for GI and PT.   Start omeprazole daily.   Wegovy injectable for weight loss if GI symptoms improve.    Shoulder Impingement Syndrome Rehab Ask your health care provider which exercises are safe for you. Do exercises exactly as told by your health care provider and adjust them as directed. It is normal to feel mild stretching, pulling, tightness, or discomfort as you do these exercises. Stop right away if you feel sudden pain or your pain gets worse. Do not begin these exercises until told by your health care provider. Stretching and range-of-motion exercise This exercise warms up your muscles and joints and improves the movement and flexibility of your shoulder. This exercise also helps to relieve pain and stiffness. Passive horizontal adduction In passive adduction, you use your other hand to move the injured arm toward your body. The injured arm does not move on its own. In this movement, your arm is moved across your body in the horizontal plane (horizontal adduction). 1. Sit or stand and pull your left / right elbow across your chest, toward your other shoulder. Stop when you feel a gentle stretch in the back of your shoulder and upper arm. ? Keep your arm at shoulder height. ? Keep your arm as close to your body as you comfortably can. 2. Hold for __________ seconds. 3. Slowly return to the starting position. Repeat __________ times. Complete this exercise __________ times a day.   Strengthening exercises These exercises build strength and endurance in your shoulder. Endurance is the ability to use your muscles for a long time, even after they get tired. External rotation, isometric This is an exercise in which you press the back of your wrist against a door frame without moving your shoulder joint (isometric). 1. Stand or sit in a doorway, facing the door frame. 2. Bend your left / right elbow and place the back of your wrist  against the door frame. Only the back of your wrist should be touching the frame. Keep your upper arm at your side. 3. Gently press your wrist against the door frame, as if you are trying to push your arm away from your abdomen (external rotation). Press as hard as you are able without pain. ? Avoid shrugging your shoulder while you press your wrist against the door frame. Keep your shoulder blade tucked down toward the middle of your back. 4. Hold for __________ seconds. 5. Slowly release the tension, and relax your muscles completely before you repeat the exercise. Repeat __________ times. Complete this exercise __________ times a day. Internal rotation, isometric This is an exercise in which you press your palm against a door frame without moving your shoulder joint (isometric). 1. Stand or sit in a doorway, facing the door frame. 2. Bend your left / right elbow and place the palm of your hand against the door frame. Only your palm should be touching the frame. Keep your upper arm at your side. 3. Gently press your hand against the door frame, as if you are trying to push your arm toward your abdomen (internal rotation). Press as hard as you are able without pain. ? Avoid shrugging your shoulder while you press your hand against the door frame. Keep your shoulder blade tucked down toward the middle of your back. 4. Hold for __________ seconds. 5. Slowly release the tension, and relax your muscles completely before you repeat the exercise. Repeat __________ times. Complete this exercise __________ times a day.  Scapular protraction, supine 1. Lie on your back on a firm surface (supine position). Hold a __________ weight in your left / right hand. 2. Raise your left / right arm straight into the air so your hand is directly above your shoulder joint. 3. Push the weight into the air so your shoulder (scapula) lifts off the surface that you are lying on. The scapula will push up or forward  (protraction). Do not move your head, neck, or back. 4. Hold for __________ seconds. 5. Slowly return to the starting position. Let your muscles relax completely before you repeat this exercise. Repeat __________ times. Complete this exercise __________ times a day.   Scapular retraction 1. Sit in a stable chair without armrests, or stand up. 2. Secure an exercise band to a stable object in front of you so the band is at shoulder height. 3. Hold one end of the exercise band in each hand. Your palms should face down. 4. Squeeze your shoulder blades together (retraction) and move your elbows slightly behind you. Do not shrug your shoulders upward while you do this. 5. Hold for __________ seconds. 6. Slowly return to the starting position. Repeat __________ times. Complete this exercise __________ times a day.   Shoulder extension 1. Sit in a stable chair without armrests, or stand up. 2. Secure an exercise band to a stable object in front of you so the band is above shoulder height. 3. Hold one end of the exercise band in each hand. 4. Straighten your elbows and lift your hands up to shoulder height. 5. Squeeze your shoulder blades together and pull your hands down to the sides of your thighs (extension). Stop when your hands are straight down by your sides. Do not let your hands go behind your body. 6. Hold for __________ seconds. 7. Slowly return to the starting position. Repeat __________ times. Complete this exercise __________ times a day.   This information is not intended to replace advice given to you by your health care provider. Make sure you discuss any questions you have with your health care provider. Document Revised: 05/09/2018 Document Reviewed: 02/10/2018 Elsevier Patient Education  2021 [place] Cuff Tendinitis  Rotator cuff tendinitis is inflammation of the tendons in the rotator cuff. Tendons are tough, cord-like bands that connect muscle to bone. The  rotator cuff includes all of the muscles and tendons that connect the arm to the shoulder. The rotator cuff holds the head of the humerus, or the upper arm bone, in the cup of the shoulder blade (scapula). This condition can lead to a long-term or chronic tear. The tear may be partial or complete. What are the causes? This condition is usually caused by overusing the rotator cuff. What increases the risk? This condition is more likely to develop in athletes and workers who frequently use their shoulder or reach over their heads. This can include activities such as:  Tennis.  Baseball or softball.  Swimming.  Construction work.  Painting. What are the signs or symptoms? Symptoms of this condition include:  Pain that spreads (radiates) from the shoulder to the upper arm.  Swelling and tenderness in front of the shoulder.  Pain when reaching, pulling, or lifting the arm above the head.  Pain when lowering the arm from above the head.  Minor pain in the shoulder when resting.  Increased pain in the shoulder at night.  Difficulty placing the arm behind the back. How is this diagnosed? This condition is diagnosed with  a physical exam and medical history. Tests may also be done, including:  X-rays.  MRI.  Ultrasound.  CT with or without contrast. How is this treated? Treatment for this condition depends on the severity of the condition. In less severe cases, treatment may include:  Rest. This may be done with a sling that holds the shoulder still (immobilization). Your health care provider may also recommend avoiding activities that involve lifting your arm over your head.  Icing the shoulder.  Anti-inflammatory medicines, such as aspirin or ibuprofen. In more severe cases, treatment may include:  Physical therapy.  Steroid injections.  Surgery. Follow these instructions at home: If you have a sling:  Wear the sling as told by your health care provider. Remove it  only as told by your health care provider.  Loosen it if your fingers tingle, become numb, or turn cold and blue.  Keep it clean.  If the sling is not waterproof: ? Do not let it get wet. ? Cover it with a watertight covering when you take a bath or shower. Managing pain, stiffness, and swelling  If directed, put ice on the injured area. To do this: ? If you have a removable sling, remove it as told by your health care provider. ? Put ice in a plastic bag. ? Place a towel between your skin and the bag. ? Leave the ice on for 20 minutes, 2-3 times a day.  Move your fingers often to reduce stiffness and swelling.  Raise (elevate) the injured area above the level of your heart while you are lying down.  Find a comfortable sleeping position, or sleep in a recliner, if available.   Activity  Rest your shoulder as told by your health care provider.  Ask your health care provider when it is safe to drive if you have a sling on your arm.  Return to your normal activities as told by your health care provider. Ask your health care provider what activities are safe for you.  Do any exercises or stretches as told by your health care provider or physical therapist.  If you do repetitive overhead tasks, take small breaks in between and include stretching exercises as told by your health care provider. General instructions  Do not use any products that contain nicotine or tobacco, such as cigarettes, e-cigarettes, and chewing tobacco. These can delay healing. If you need help quitting, ask your health care provider.  Take over-the-counter and prescription medicines only as told by your health care provider.  Keep all follow-up visits as told by your health care provider. This is important. Contact a health care provider if:  Your pain gets worse.  You have new pain in your arm, hands, or fingers.  Your pain is not relieved with medicine or does not get better after 6 weeks of  treatment.  You have crackling sensations when moving your shoulder in certain directions.  You hear a snapping sound after using your shoulder, followed by severe pain and weakness. Get help right away if:  Your arm, hand, or fingers are numb or tingling.  Your arm, hand, or fingers are swollen or painful or they turn white or blue. Summary  Rotator cuff tendinitis is inflammation of the tendons in the rotator cuff. Tendons are tough, cord-like bands that connect muscle to bone.  This condition is usually caused by overusing the rotator cuff, which includes all of the muscles and tendons that connect the arm to the shoulder.  This condition is more  likely to develop in athletes and workers who frequently use their shoulder or reach over their heads.  Treatment generally includes rest, anti-inflammatory medicines, and icing. In some cases, physical therapy and steroid injections may be needed. In severe cases, surgery may be needed. This information is not intended to replace advice given to you by your health care provider. Make sure you discuss any questions you have with your health care provider. Document Revised: 10/20/2018 Document Reviewed: 10/20/2018 Elsevier Patient Education  [place] Maintenance, Female Adopting a healthy lifestyle and getting preventive care are important in promoting health and wellness. Ask your health care provider about:  The right schedule for you to have regular tests and exams.  Things you can do on your own to prevent diseases and keep yourself healthy. What should I know about diet, weight, and exercise? Eat a healthy diet  Eat a diet that includes plenty of vegetables, fruits, low-fat dairy products, and lean protein.  Do not eat a lot of foods that are high in solid fats, added sugars, or sodium.   Maintain a healthy weight Body mass index (BMI) is used to identify weight problems. It estimates body fat based on height  and weight. Your health care provider can help determine your BMI and help you achieve or maintain a healthy weight. Get regular exercise Get regular exercise. This is one of the most important things you can do for your health. Most adults should:  Exercise for at least 150 minutes each week. The exercise should increase your heart rate and make you sweat (moderate-intensity exercise).  Do strengthening exercises at least twice a week. This is in addition to the moderate-intensity exercise.  Spend less time sitting. Even light physical activity can be beneficial. Watch cholesterol and blood lipids Have your blood tested for lipids and cholesterol at 56 years of age, then have this test every 5 years. Have your cholesterol levels checked more often if:  Your lipid or cholesterol levels are high.  You are older than 56 years of age.  You are at high risk for heart disease. What should I know about cancer screening? Depending on your health history and family history, you may need to have cancer screening at various ages. This may include screening for:  Breast cancer.  Cervical cancer.  Colorectal cancer.  Skin cancer.  Lung cancer. What should I know about heart disease, diabetes, and high blood pressure? Blood pressure and heart disease  High blood pressure causes heart disease and increases the risk of stroke. This is more likely to develop in people who have high blood pressure readings, are of African descent, or are overweight.  Have your blood pressure checked: ? Every 3-5 years if you are 6-5 years of age. ? Every year if you are 87 years old or older. Diabetes Have regular diabetes screenings. This checks your fasting blood sugar level. Have the screening done:  Once every three years after age 34 if you are at a normal weight and have a low risk for diabetes.  More often and at a younger age if you are overweight or have a high risk for diabetes. What should I  know about preventing infection? Hepatitis B If you have a higher risk for hepatitis B, you should be screened for this virus. Talk with your health care provider to find out if you are at risk for hepatitis B infection. Hepatitis C Testing is recommended for:  Everyone born from [birth year] through  [place] with known risk factors for hepatitis C. Sexually transmitted infections (STIs)  Get screened for STIs, including gonorrhea and chlamydia, if: ? You are sexually active and are younger than 56 years of age. ? You are older than 56 years of age and your health care provider tells you that you are at risk for this type of infection. ? Your sexual activity has changed since you were last screened, and you are at increased risk for chlamydia or gonorrhea. Ask your health care provider if you are at risk.  Ask your health care provider about whether you are at high risk for HIV. Your health care provider may recommend a prescription medicine to help prevent HIV infection. If you choose to take medicine to prevent HIV, you should first get tested for HIV. You should then be tested every 3 months for as long as you are taking the medicine. Pregnancy  If you are about to stop having your period (premenopausal) and you may become pregnant, seek counseling before you get pregnant.  Take 400 to 800 micrograms (mcg) of folic acid every day if you become pregnant.  Ask for birth control (contraception) if you want to prevent pregnancy. Osteoporosis and menopause Osteoporosis is a disease in which the bones lose minerals and strength with aging. This can result in bone fractures. If you are 53 years old or older, or if you are at risk for osteoporosis and fractures, ask your health care provider if you should:  Be screened for bone loss.  Take a calcium or vitamin D supplement to lower your risk of fractures.  Be given hormone replacement therapy (HRT) to treat symptoms of menopause. Follow these  instructions at home: Lifestyle  Do not use any products that contain nicotine or tobacco, such as cigarettes, e-cigarettes, and chewing tobacco. If you need help quitting, ask your health care provider.  Do not use street drugs.  Do not share needles.  Ask your health care provider for help if you need support or information about quitting drugs. Alcohol use  Do not drink alcohol if: ? Your health care provider tells you not to drink. ? You are pregnant, may be pregnant, or are planning to become pregnant.  If you drink alcohol: ? Limit how much you use to 0-1 drink a day. ? Limit intake if you are breastfeeding.  Be aware of how much alcohol is in your drink. In the [place] one drink equals one 12 oz bottle of beer (355 mL), one 5 oz glass of wine (148 mL), or one 1 oz glass of hard liquor (44 mL). General instructions  Schedule regular health, dental, and eye exams.  Stay current with your vaccines.  Tell your health care provider if: ? You often feel depressed. ? You have ever been abused or do not feel safe at home. Summary  Adopting a healthy lifestyle and getting preventive care are important in promoting health and wellness.  Follow your health care provider's instructions about healthy diet, exercising, and getting tested or screened for diseases.  Follow your health care provider's instructions on monitoring your cholesterol and blood pressure. This information is not intended to replace advice given to you by your health care provider. Make sure you discuss any questions you have with your health care provider. Document Revised: 01/08/2018 Document Reviewed: 01/08/2018 Elsevier Patient Education  2021 [place] for Gastroesophageal Reflux Disease, Adult When you have gastroesophageal reflux disease (GERD), the foods you eat and your  eating habits are very important. Choosing the right foods can help ease your discomfort. Think about working with a  food expert (dietitian) to help you make good choices. What are tips for following this plan? Reading food labels  Look for foods that are low in saturated fat. Foods that may help with your symptoms include: ? Foods that have less than 5% of daily value (DV) of fat. ? Foods that have 0 grams of trans fat. Cooking  Do not fry your food.  Cook your food by baking, steaming, grilling, or broiling. These are all methods that do not need a lot of fat for cooking.  To add flavor, try to use herbs that are low in spice and acidity. Meal planning  Choose healthy foods that are low in fat, such as: ? Fruits and vegetables. ? Whole grains. ? Low-fat dairy products. ? Lean meats, fish, and poultry.  Eat small meals often instead of eating 3 large meals each day. Eat your meals slowly in a place where you are relaxed. Avoid bending over or lying down until 2-3 hours after eating.  Limit high-fat foods such as fatty meats or fried foods.  Limit your intake of fatty foods, such as oils, butter, and shortening.  Avoid the following as told by your doctor: ? Foods that cause symptoms. These may be different for different people. Keep a food diary to keep track of foods that cause symptoms. ? Alcohol. ? Drinking a lot of liquid with meals. ? Eating meals during the 2-3 hours before bed.   Lifestyle  Stay at a healthy weight. Ask your doctor what weight is healthy for you. If you need to lose weight, work with your doctor to do so safely.  Exercise for at least 30 minutes on 5 or more days each week, or as told by your doctor.  Wear loose-fitting clothes.  Do not smoke or use any products that contain nicotine or tobacco. If you need help quitting, ask your doctor.  Sleep with the head of your bed higher than your feet. Use a wedge under the mattress or blocks under the bed frame to raise the head of the bed.  Chew sugar-free gum after meals. What foods should eat? Eat a healthy,  well-balanced diet of fruits, vegetables, whole grains, low-fat dairy products, lean meats, fish, and poultry. Each person is different. Foods that may cause symptoms in one person may not cause any symptoms in another person. Work with your doctor to find foods that are safe for you. The items listed above may not be a complete list of what you can eat and drink. Contact a food expert for more options.   What foods should I avoid? Limiting some of these foods may help in managing the symptoms of GERD. Everyone is different. Talk with a food expert or your doctor to help you find the exact foods to avoid, if any. Fruits Any fruits prepared with added fat. Any fruits that cause symptoms. For some people, this may include citrus fruits, such as oranges, grapefruit, pineapple, and lemons. Vegetables Deep-fried vegetables. [place] fries. Any vegetables prepared with added fat. Any vegetables that cause symptoms. For some people, this may include tomatoes and tomato products, chili peppers, onions and garlic, and horseradish. Grains Pastries or quick breads with added fat. Meats and other proteins High-fat meats, such as fatty beef or pork, hot dogs, ribs, ham, sausage, salami, and bacon. Fried meat or protein, including fried fish and fried chicken. Nuts  and nut butters, in large amounts. Dairy Whole milk and chocolate milk. Sour cream. Cream. Ice cream. Cream cheese. Milkshakes. Fats and oils Butter. Margarine. Shortening. Ghee. Beverages Coffee and tea, with or without caffeine. Carbonated beverages. Sodas. Energy drinks. Fruit juice made with acidic fruits, such as orange or grapefruit. Tomato juice. Alcoholic drinks. Sweets and desserts Chocolate and cocoa. Donuts. Seasonings and condiments Pepper. Peppermint and spearmint. Added salt. Any condiments, herbs, or seasonings that cause symptoms. For some people, this may include curry, hot sauce, or vinegar-based salad dressings. The items listed  above may not be a complete list of what you should not eat and drink. Contact a food expert for more options. Questions to ask your doctor Diet and lifestyle changes are often the first steps that are taken to manage symptoms of GERD. If diet and lifestyle changes do not help, talk with your doctor about taking medicines. Where to find more information  [employer] for Gastrointestinal Disorders: aboutgerd.org Summary  When you have GERD, food and lifestyle choices are very important in easing your symptoms.  Eat small meals often instead of 3 large meals a day. Eat your meals slowly and in a place where you are relaxed.  Avoid bending over or lying down until 2-3 hours after eating.  Limit high-fat foods such as fatty meats or fried foods. This information is not intended to replace advice given to you by your health care provider. Make sure you discuss any questions you have with your health care provider. Document Revised: 07/27/2019 Document Reviewed: 07/27/2019 Elsevier Patient Education  2021 [place].   Semaglutide injection solution What is this medicine? SEMAGLUTIDE (Sem a GLOO tide) is used to improve blood sugar control in adults with type 2 diabetes. This medicine may be used with other diabetes medicines. This drug may also reduce the risk of heart attack or stroke if you have type 2 diabetes and risk factors for heart disease. This medicine may be used for other purposes; ask your health care provider or pharmacist if you have questions. COMMON BRAND NAME(S): OZEMPIC What should I tell my health care provider before I take this medicine? They need to know if you have any of these conditions:  endocrine tumors (MEN 2) or if someone in your family had these tumors  eye disease, vision problems  history of pancreatitis  kidney disease  stomach problems  thyroid cancer or if someone in your family had thyroid cancer  an unusual or allergic reaction  to semaglutide, other medicines, foods, dyes, or preservatives  pregnant or trying to get pregnant  breast-feeding How should I use this medicine? This medicine is for injection under the skin of your upper leg (thigh), stomach area, or upper arm. It is given once every week (every 7 days). You will be taught how to prepare and give this medicine. Use exactly as directed. Take your medicine at regular intervals. Do not take it more often than directed. If you use this medicine with insulin, you should inject this medicine and the insulin separately. Do not mix them together. Do not give the injections right next to each other. Change (rotate) injection sites with each injection. It is important that you put your used needles and syringes in a special sharps container. Do not put them in a trash can. If you do not have a sharps container, call your pharmacist or healthcare provider to get one. A special MedGuide will be given to you by the pharmacist with each prescription  and refill. Be sure to read this information carefully each time. This drug comes with INSTRUCTIONS FOR USE. Ask your pharmacist for directions on how to use this drug. Read the information carefully. Talk to your pharmacist or health care provider if you have questions. Talk to your pediatrician regarding the use of this medicine in children. Special care may be needed. Overdosage: If you think you have taken too much of this medicine contact a poison control center or emergency room at once. NOTE: This medicine is only for you. Do not share this medicine with others. What if I miss a dose? If you miss a dose, take it as soon as you can within 5 days after the missed dose. Then take your next dose at your regular weekly time. If it has been longer than 5 days after the missed dose, do not take the missed dose. Take the next dose at your regular time. Do not take double or extra doses. If you have questions about a missed dose,  contact your health care provider for advice. What may interact with this medicine?  other medicines for diabetes Many medications may cause changes in blood sugar, these include:  alcohol containing beverages  antiviral medicines for HIV or AIDS  aspirin and aspirin-like drugs  certain medicines for blood pressure, heart disease, irregular heart beat  chromium  diuretics  female hormones, such as estrogens or progestins, birth control pills  fenofibrate  gemfibrozil  isoniazid  lanreotide  female hormones or anabolic steroids  MAOIs like Carbex, Eldepryl, Marplan, Nardil, and Parnate  medicines for weight loss  medicines for allergies, asthma, cold, or cough  medicines for depression, anxiety, or psychotic disturbances  niacin  nicotine  NSAIDs, medicines for pain and inflammation, like ibuprofen or naproxen  octreotide  pasireotide  pentamidine  phenytoin  probenecid  quinolone antibiotics such as ciprofloxacin, levofloxacin, ofloxacin  some herbal dietary supplements  steroid medicines such as prednisone or cortisone  sulfamethoxazole; trimethoprim  thyroid hormones Some medications can hide the warning symptoms of low blood sugar (hypoglycemia). You may need to monitor your blood sugar more closely if you are taking one of these medications. These include:  beta-blockers, often used for high blood pressure or heart problems (examples include atenolol, metoprolol, propranolol)  clonidine  guanethidine  reserpine This list may not describe all possible interactions. Give your health care provider a list of all the medicines, herbs, non-prescription drugs, or dietary supplements you use. Also tell them if you smoke, drink alcohol, or use illegal drugs. Some items may interact with your medicine. What should I watch for while using this medicine? Visit your doctor or health care professional for regular checks on your progress. Drink plenty of  fluids while taking this medicine. Check with your doctor or health care professional if you get an attack of severe diarrhea, nausea, and vomiting. The loss of too much body fluid can make it dangerous for you to take this medicine. A test called the HbA1C (A1C) will be monitored. This is a simple blood test. It measures your blood sugar control over the last 2 to 3 months. You will receive this test every 3 to 6 months. Learn how to check your blood sugar. Learn the symptoms of low and high blood sugar and how to manage them. Always carry a quick-source of sugar with you in case you have symptoms of low blood sugar. Examples include hard sugar candy or glucose tablets. Make sure others know that you can choke  if you eat or drink when you develop serious symptoms of low blood sugar, such as seizures or unconsciousness. They must get medical help at once. Tell your doctor or health care professional if you have high blood sugar. You might need to change the dose of your medicine. If you are sick or exercising more than usual, you might need to change the dose of your medicine. Do not skip meals. Ask your doctor or health care professional if you should avoid alcohol. Many nonprescription cough and cold products contain sugar or alcohol. These can affect blood sugar. Pens should never be shared. Even if the needle is changed, sharing may result in passing of viruses like hepatitis or HIV. Wear a medical ID bracelet or chain, and carry a card that describes your disease and details of your medicine and dosage times. Do not become pregnant while taking this medicine. Women should inform their doctor if they wish to become pregnant or think they might be pregnant. There is a potential for serious side effects to an unborn child. Talk to your health care professional or pharmacist for more information. What side effects may I notice from receiving this medicine? Side effects that you should report to your  doctor or health care professional as soon as possible:  allergic reactions like skin rash, itching or hives, swelling of the face, lips, or tongue  breathing problems  changes in vision  diarrhea that continues or is severe  lump or swelling on the neck  severe nausea  signs and symptoms of infection like fever or chills; cough; sore throat; pain or trouble passing urine  signs and symptoms of low blood sugar such as feeling anxious, confusion, dizziness, increased hunger, unusually weak or tired, sweating, shakiness, cold, irritable, headache, blurred vision, fast heartbeat, loss of consciousness  signs and symptoms of kidney injury like trouble passing urine or change in the amount of urine  trouble swallowing  unusual stomach upset or pain  vomiting Side effects that usually do not require medical attention (report to your doctor or health care professional if they continue or are bothersome):  constipation  diarrhea  nausea  pain, redness, or irritation at site where injected  stomach upset This list may not describe all possible side effects. Call your doctor for medical advice about side effects. You may report side effects to FDA at 1-800-FDA-1088. Where should I keep my medicine? Keep out of the reach of children. Store unopened pens in a refrigerator between 2 and 8 degrees C (36 and 46 degrees F). Do not freeze. Protect from light and heat. After you first use the pen, it can be stored for 56 days at room temperature between 15 and 30 degrees C (59 and 86 degrees F) or in a refrigerator. Throw away your used pen after 56 days or after the expiration date, whichever comes first. Do not store your pen with the needle attached. If the needle is left on, medicine may leak from the pen. NOTE: This sheet is a summary. It may not cover all possible information. If you have questions about this medicine, talk to your doctor, pharmacist, or health care provider.  2021  Elsevier/Gold Standard (2018-09-30 09:41:51)

## 2020-03-22 NOTE — Telephone Encounter (Signed)
Transition Care Management Follow-up Telephone Call  Date of discharge and from where: 03/19/2020 from [facility]   How have you been since you were released from the hospital? Pt stated that she is feeling some better. Pt did state that the medication makes her too drowsy and does not want to take it.   Any questions or concerns? No  Items Reviewed:  Did the pt receive and understand the discharge instructions provided? Yes   Medications obtained and verified? Yes   Other? No   Any new allergies since your discharge? No   Dietary orders reviewed? N/A  Do you have support at home? Yes   Functional Questionnaire: (I = Independent and D = Dependent) ADLs: I  Bathing/Dressing- I  Meal Prep- I  Eating- I  Maintaining continence- I  Transferring/Ambulation- I  Managing Meds- I   Follow up appointments reviewed:   PCP Hospital f/u appt confirmed? Yes  Scheduled to see Iran Planas, PA-C on 03/22/20 @ 03:20pm.  Are transportation arrangements needed? No  If their condition worsens, is the pt aware to call PCP or go to the Emergency Dept.? Yes Was the patient provided with contact information for the PCP's office or ED? Yes Was to pt encouraged to call back with questions or concerns? Yes

## 2020-03-26 ENCOUNTER — Other Ambulatory Visit: Payer: Self-pay | Admitting: Physician Assistant

## 2020-03-26 DIAGNOSIS — E559 Vitamin D deficiency, unspecified: Secondary | ICD-10-CM

## 2020-03-28 ENCOUNTER — Other Ambulatory Visit: Payer: Self-pay

## 2020-03-28 DIAGNOSIS — E559 Vitamin D deficiency, unspecified: Secondary | ICD-10-CM

## 2020-03-28 MED ORDER — VITAMIN D (ERGOCALCIFEROL) 1.25 MG (50000 UNIT) PO CAPS: 50000.0000 [IU] | ORAL_CAPSULE | ORAL | 0 refills | Status: DC

## 2020-03-29 DIAGNOSIS — K219 Gastro-esophageal reflux disease without esophagitis: Secondary | ICD-10-CM | POA: Insufficient documentation

## 2020-03-29 DIAGNOSIS — R1013 Epigastric pain: Secondary | ICD-10-CM | POA: Insufficient documentation

## 2020-03-29 HISTORY — DX: Epigastric pain: R10.13

## 2020-03-29 HISTORY — DX: Gastro-esophageal reflux disease without esophagitis: K21.9

## 2020-03-29 NOTE — Progress Notes (Signed)
Subjective:     Pam Gomez is a 55 y.o. female and is here for a comprehensive physical exam. The patient reports problems - see below.    Patient does complain of some reflux symptoms.  She has not been on medication.  She does drink seltzer water and take Tums which seems to help from time to time.  She denies any melena or hematochezia.  She denies any nausea or vomiting.  Her bowel movements are normal.  She does have a new sensation of something moving around her diaphragm area and some epigastric discomfort.  Seems to be worse when she bends over.  Patient requests vascular screening testing.  Patient denies any symptoms.  Pt having some left shoulder pain over past few months. No known injury. Not tried anything to make better. Using it makes worse.    Social History   Socioeconomic History  . Marital status: Married    Spouse name: Not on file  . Number of children: Not on file  . Years of education: Not on file  . Highest education level: Not on file  Occupational History  . Not on file  Tobacco Use  . Smoking status: Never Smoker  . Smokeless tobacco: Never Used  Vaping Use  . Vaping Use: Never used  Substance and Sexual Activity  . Alcohol use: No    Alcohol/week: 0.0 standard drinks  . Drug use: No  . Sexual activity: Not on file  Other Topics Concern  . Not on file  Social History Narrative  . Not on file   Social Determinants of Health   Financial Resource Strain: Not on file  Food Insecurity: Not on file  Transportation Needs: Not on file  Physical Activity: Not on file  Stress: Not on file  Social Connections: Not on file  Intimate Partner Violence: Not on file   Health Maintenance  Topic Date Due  . Hepatitis C Screening  Never done  . HIV Screening  03/22/2021 (Originally 06/13/1980)  . PAP SMEAR-Modifier  07/11/2020  . MAMMOGRAM  01/06/2021  . TETANUS/TDAP  04/15/2023  . COLONOSCOPY (Pts 45-70yrs Insurance coverage will need to be confirmed)   03/24/2025  . INFLUENZA VACCINE  Completed  . COVID-19 Vaccine  Completed  . HPV VACCINES  Aged Out    The following portions of the patient's history were reviewed and updated as appropriate: allergies, current medications, past family history, past medical history, past social history, past surgical history and problem list.  Review of Systems Pertinent items noted in HPI and remainder of comprehensive ROS otherwise negative.   Objective:    BP 130/65   Pulse 68   Ht 5' 3.25" (1.607 m)   Wt 203 lb (92.1 kg)   LMP 11/08/2011   SpO2 97%   BMI 35.68 kg/m  General appearance: alert, cooperative, appears stated age and moderately obese Head: Normocephalic, without obvious abnormality, atraumatic Eyes: conjunctivae/corneas clear. PERRL, EOM's intact. Fundi benign. Ears: normal TM's and external ear canals both ears Nose: Nares normal. Septum midline. Mucosa normal. No drainage or sinus tenderness. Throat: lips, mucosa, and tongue normal; teeth and gums normal Neck: no adenopathy, no carotid bruit, no JVD, supple, symmetrical, trachea midline and thyroid not enlarged, symmetric, no tenderness/mass/nodules Back: symmetric, no curvature. ROM normal. No CVA tenderness. Lungs: clear to auscultation bilaterally Heart: regular rate and rhythm, S1, S2 normal, no murmur, click, rub or gallop Abdomen: soft, non-tender; bowel sounds normal; no masses,  no organomegaly Extremities: extremities normal, atraumatic,  no cyanosis or edema left shoulder pain with external ROM. Strength 5/5. Abduction 145 degrees with some pain to further ROM. No swelling or point tenderness.  Pulses: 2+ and symmetric Skin: Skin color, texture, turgor normal. No rashes or lesions Lymph nodes: Cervical, supraclavicular, and axillary nodes normal. Neurologic: Alert and oriented X 3, normal strength and tone. Normal symmetric reflexes. Normal coordination and gait    Assessment:    Healthy female exam.     Plan:     Marland KitchenMarland KitchenDiannah was seen today for annual exam.  Diagnoses and all orders for this visit:  Routine physical examination -     CBC with Differential/Platelet -     COMPLETE METABOLIC PANEL WITH GFR -     Lipid Panel w/reflex Direct LDL -     TSH  Screening for lipid disorders -     Lipid Panel w/reflex Direct LDL  Chronic fatigue -     TSH  Class 2 obesity due to excess calories without serious comorbidity with body mass index (BMI) of 35.0 to 35.9 in adult -     TSH  Elevated LDL cholesterol level -     Lipid Panel w/reflex Direct LDL -     VAS [place] ABI WITH/WO TBI -     US Carotid Duplex Bilateral  Diabetes mellitus screening -     COMPLETE METABOLIC PANEL WITH GFR  Encounter for hepatitis C screening test for low risk patient -     Hepatitis C Antibody  Vitamin D insufficiency -     VITAMIN D 25 Hydroxy (Vit-D Deficiency, Fractures)  B12 deficiency -     B12 and Folate Panel  Acute pain of left shoulder -     Ambulatory referral to Physical Therapy  Gastroesophageal reflux disease, unspecified whether esophagitis present -     omeprazole (PRILOSEC) 40 MG capsule; Take 1 capsule (40 mg total) by mouth daily. -     Ambulatory referral to Gastroenterology  Epigastric discomfort -     omeprazole (PRILOSEC) 40 MG capsule; Take 1 capsule (40 mg total) by mouth daily. -     Ambulatory referral to Gastroenterology   .Marland Kitchen Discussed 150 minutes of exercise a week.  Encouraged vitamin D 1000 units and Calcium 1300mg  or 4 servings of dairy a day.  Fasting labs ordered. Pap UTD. Mammogram scheduled 3/3.  Colonoscopy UTD.  Covid/flu/shingrix UTD.    Screening carotid u/s and ABI ordered.  GERD/Epigastric discomfort-  Start omeprazole. GI referral made. Discussed GERD diet and weight loss.   Likely shoulder impingement syndrome-  Get xray. Start PT.  Discussed topical NSAIDS. Avoid oral due to GI symptoms.  Ice and consider Icy hot patches.  Follow up with Pam Gomez.    Marland Kitchen.Discussed low carb diet with 1500 calories and 80g of protein.  Exercising at least 150 minutes a week.  [employer] could be a Microbiologist.  Hold on wegovy due to GI side effects.  See After Visit Summary for Counseling Recommendations

## 2020-03-30 NOTE — Addendum Note (Signed)
Addended byAnnamaria Helling on: 03/30/2020 10:15 AM   Modules accepted: Orders

## 2020-03-31 ENCOUNTER — Ambulatory Visit

## 2020-04-01 NOTE — Progress Notes (Signed)
Analeese,   Kidney, liver, glucose look GREAT.  Thyroid looks good.  Hemoglobin on the low side of normal. Please add ferritin and serum iron. Lets see what they are.  Your platelets are a little elevated. If you can tolerate baby ASA 81mg  a day I would for prevention.  B12 on low side of normal. Make sure taking 1033mcg daily.  Vitamin D low take 2000 units daily of D3.  Cholesterol still elevated and could be better but down from 1 year ago and CV 10 year risk is 4.3 percent under the 7.5 percent to treat.

## 2020-04-04 NOTE — Progress Notes (Signed)
Did not order an ANA level. I do not remember looking for autoimmune disease. We did do a lot at that visit. We could see if they can add it on.

## 2020-04-05 NOTE — Progress Notes (Signed)
Your iron panel showed great ferritin and iron stores.  Your serum iron looks great.  You are not anemic.

## 2020-04-07 ENCOUNTER — Other Ambulatory Visit: Payer: Self-pay

## 2020-04-07 ENCOUNTER — Ambulatory Visit (INDEPENDENT_AMBULATORY_CARE_PROVIDER_SITE_OTHER)

## 2020-04-07 ENCOUNTER — Other Ambulatory Visit: Payer: Self-pay | Admitting: Neurology

## 2020-04-07 DIAGNOSIS — K219 Gastro-esophageal reflux disease without esophagitis: Secondary | ICD-10-CM

## 2020-04-07 DIAGNOSIS — E7800 Pure hypercholesterolemia, unspecified: Secondary | ICD-10-CM

## 2020-04-07 DIAGNOSIS — M25512 Pain in left shoulder: Secondary | ICD-10-CM

## 2020-04-07 DIAGNOSIS — Z1231 Encounter for screening mammogram for malignant neoplasm of breast: Secondary | ICD-10-CM

## 2020-04-07 DIAGNOSIS — R1013 Epigastric pain: Secondary | ICD-10-CM

## 2020-04-07 MED ORDER — OMEPRAZOLE 40 MG PO CPDR: 40.0000 mg | DELAYED_RELEASE_CAPSULE | Freq: Every day | ORAL | 3 refills | Status: DC

## 2020-04-08 LAB — CBC WITH DIFFERENTIAL/PLATELET
Absolute Monocytes: 557 cells/uL (ref 200–950)
Basophils Absolute: 37 cells/uL (ref 0–200)
Basophils Relative: 0.7 %
Eosinophils Absolute: 297 cells/uL (ref 15–500)
Eosinophils Relative: 5.6 %
HCT: 35.3 % (ref 35.0–45.0)
Hemoglobin: 11.8 g/dL (ref 11.7–15.5)
Lymphs Abs: 2003 cells/uL (ref 850–3900)
MCH: 29 pg (ref 27.0–33.0)
MCHC: 33.4 g/dL (ref 32.0–36.0)
MCV: 86.7 fL (ref 80.0–100.0)
MPV: 9.6 fL (ref 7.5–12.5)
Monocytes Relative: 10.5 %
Neutro Abs: 2406 cells/uL (ref 1500–7800)
Neutrophils Relative %: 45.4 %
Platelets: 411 10*3/uL — ABNORMAL HIGH (ref 140–400)
RBC: 4.07 10*6/uL (ref 3.80–5.10)
RDW: 13.1 % (ref 11.0–15.0)
Total Lymphocyte: 37.8 %
WBC: 5.3 10*3/uL (ref 3.8–10.8)

## 2020-04-08 LAB — LIPID PANEL W/REFLEX DIRECT LDL
Cholesterol: 201 mg/dL — ABNORMAL HIGH (ref <200)
HDL: 45 mg/dL — ABNORMAL LOW (ref >=50)
LDL Cholesterol (Calc): 137 mg/dL (calc) — ABNORMAL HIGH
Non-HDL Cholesterol (Calc): 156 mg/dL (calc) — ABNORMAL HIGH (ref <130)
Total CHOL/HDL Ratio: 4.5 (calc) (ref <5.0)
Triglycerides: 88 mg/dL (ref <150)

## 2020-04-08 LAB — COMPLETE METABOLIC PANEL WITH GFR
AG Ratio: 1.3 (calc) (ref 1.0–2.5)
ALT: 16 U/L (ref 6–29)
AST: 16 U/L (ref 10–35)
Albumin: 4.1 g/dL (ref 3.6–5.1)
Alkaline phosphatase (APISO): 57 U/L (ref 37–153)
BUN: 14 mg/dL (ref 7–25)
CO2: 29 mmol/L (ref 20–32)
Calcium: 9.7 mg/dL (ref 8.6–10.4)
Chloride: 107 mmol/L (ref 98–110)
Creat: 0.97 mg/dL (ref 0.50–1.05)
GFR, Est African American: 76 mL/min/{1.73_m2} (ref >=60)
GFR, Est Non African American: 66 mL/min/{1.73_m2} (ref >=60)
Globulin: 3.2 g/dL (calc) (ref 1.9–3.7)
Glucose, Bld: 99 mg/dL (ref 65–99)
Potassium: 4.4 mmol/L (ref 3.5–5.3)
Sodium: 141 mmol/L (ref 135–146)
Total Bilirubin: 0.4 mg/dL (ref 0.2–1.2)
Total Protein: 7.3 g/dL (ref 6.1–8.1)

## 2020-04-08 LAB — HEPATITIS C ANTIBODY
Hepatitis C Ab: NONREACTIVE
SIGNAL TO CUT-OFF: 0.05 (ref <1.00)

## 2020-04-08 LAB — IRON,TIBC AND FERRITIN PANEL
%SAT: 22 % (calc) (ref 16–45)
Ferritin: 107 ng/mL (ref 16–232)
Iron: 78 ug/dL (ref 45–160)
TIBC: 358 mcg/dL (calc) (ref 250–450)

## 2020-04-08 LAB — ANA SCREEN,IFA, WITH REFLEX TO TITER AND PATTERN (REFL): ANA SCREEN, IFA: POSITIVE — AB

## 2020-04-08 LAB — VITAMIN D 25 HYDROXY (VIT D DEFICIENCY, FRACTURES): Vit D, 25-Hydroxy: 28 ng/mL — ABNORMAL LOW (ref 30–100)

## 2020-04-08 LAB — ANA TITER AND PATTERN REFLEX: ANA Titer 1: 1:320 {titer} — ABNORMAL HIGH

## 2020-04-08 LAB — TSH: TSH: 2.36 mIU/L

## 2020-04-08 LAB — B12 AND FOLATE PANEL
Folate: 6.3 ng/mL
Vitamin B-12: 355 pg/mL (ref 200–1100)

## 2020-04-08 NOTE — Progress Notes (Signed)
ANA positive and as expected with Lupus.

## 2020-04-11 NOTE — Progress Notes (Signed)
Pam Gomez,   No acute findings in left shoulder. No significant degeneration and no fracture or dislocation.

## 2020-04-11 NOTE — Progress Notes (Signed)
Normal mammogram. Follow up in 1 year.

## 2020-04-13 ENCOUNTER — Telehealth: Payer: Self-pay

## 2020-04-13 DIAGNOSIS — M329 Systemic lupus erythematosus, unspecified: Secondary | ICD-10-CM

## 2020-04-13 NOTE — Addendum Note (Signed)
Addended byAnnamaria Helling on: 04/13/2020 11:58 AM   Modules accepted: Orders

## 2020-04-13 NOTE — Telephone Encounter (Signed)
Patient advised.

## 2020-04-13 NOTE — Telephone Encounter (Signed)
Pam Gomez wanted to know since her ANA titer was elevated should she make an appointment with a Rheumatologist. If so, she would like a referral to go back to Dr Dossie Der.   Pended referral.    Clabe Seal

## 2020-04-13 NOTE — Telephone Encounter (Signed)
Signed.

## 2020-04-18 NOTE — Progress Notes (Signed)
Is she talking about her colonoscopy referral? She wants to [place] not [place]?

## 2020-04-21 ENCOUNTER — Ambulatory Visit (INDEPENDENT_AMBULATORY_CARE_PROVIDER_SITE_OTHER)

## 2020-04-21 ENCOUNTER — Other Ambulatory Visit: Payer: Self-pay

## 2020-04-21 DIAGNOSIS — E7800 Pure hypercholesterolemia, unspecified: Secondary | ICD-10-CM | POA: Diagnosis not present

## 2020-04-22 NOTE — Progress Notes (Signed)
No significant plaque accumulation in the carotids. GREAT report.

## 2020-05-23 ENCOUNTER — Ambulatory Visit (HOSPITAL_BASED_OUTPATIENT_CLINIC_OR_DEPARTMENT_OTHER)

## 2020-05-23 ENCOUNTER — Other Ambulatory Visit: Payer: Self-pay

## 2020-05-23 ENCOUNTER — Ambulatory Visit (HOSPITAL_COMMUNITY)
Admission: RE | Admit: 2020-05-23 | Discharge: 2020-05-23 | Disposition: A | Discharge status: Home / Self Care | Source: Ambulatory Visit | Attending: Physician Assistant | Admitting: Physician Assistant

## 2020-05-23 DIAGNOSIS — E7800 Pure hypercholesterolemia, unspecified: Secondary | ICD-10-CM | POA: Diagnosis not present

## 2020-05-23 NOTE — Progress Notes (Signed)
ABI has been completed.   Preliminary results in CV Proc.   Abram Sander 05/23/2020 3:47 PM

## 2020-05-24 NOTE — Progress Notes (Signed)
Pam Gomez,   ABI normal. No evidence of lower extremity arterial disease.

## 2020-07-21 ENCOUNTER — Emergency Department (INDEPENDENT_AMBULATORY_CARE_PROVIDER_SITE_OTHER)

## 2020-07-21 ENCOUNTER — Emergency Department (INDEPENDENT_AMBULATORY_CARE_PROVIDER_SITE_OTHER)
Admission: RE | Admit: 2020-07-21 | Discharge: 2020-07-21 | Disposition: A | Discharge status: Home / Self Care | Payer: Self-pay | Source: Ambulatory Visit | Attending: Family Medicine | Admitting: Family Medicine

## 2020-07-21 ENCOUNTER — Other Ambulatory Visit: Payer: Self-pay

## 2020-07-21 DIAGNOSIS — S29012A Strain of muscle and tendon of back wall of thorax, initial encounter: Secondary | ICD-10-CM

## 2020-07-21 DIAGNOSIS — S161XXA Strain of muscle, fascia and tendon at neck level, initial encounter: Secondary | ICD-10-CM

## 2020-07-21 DIAGNOSIS — M542 Cervicalgia: Secondary | ICD-10-CM | POA: Diagnosis not present

## 2020-07-21 MED ORDER — CYCLOBENZAPRINE HCL 10 MG PO TABS: 10.0000 mg | ORAL_TABLET | Freq: Every day | ORAL | 0 refills | Status: DC

## 2020-07-21 NOTE — Discharge Instructions (Addendum)
Apply ice pack for 20 to 30 minutes, 3 to 4 times daily  Continue until pain and swelling decrease.  Begin range of motion and stretching exercises as tolerated.  May continue Aleve, two tabs twice daily with food.  Recommend follow-up with physical therapist as scheduled.
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
## 2020-08-22 ENCOUNTER — Encounter: Admitting: Sports Medicine

## 2020-08-29 ENCOUNTER — Encounter: Payer: Self-pay | Admitting: Physician Assistant

## 2020-08-29 ENCOUNTER — Ambulatory Visit (INDEPENDENT_AMBULATORY_CARE_PROVIDER_SITE_OTHER): Admitting: Physician Assistant

## 2020-08-29 ENCOUNTER — Other Ambulatory Visit: Payer: Self-pay

## 2020-08-29 VITALS — BP 115/61 | HR 72 | Temp 97.3°F | Ht 63.0 in | Wt 200.0 lb

## 2020-08-29 DIAGNOSIS — Z8639 Personal history of other endocrine, nutritional and metabolic disease: Secondary | ICD-10-CM

## 2020-08-29 DIAGNOSIS — R0789 Other chest pain: Secondary | ICD-10-CM

## 2020-08-29 DIAGNOSIS — Z6834 Body mass index (BMI) 34.0-34.9, adult: Secondary | ICD-10-CM

## 2020-08-29 DIAGNOSIS — R062 Wheezing: Secondary | ICD-10-CM

## 2020-08-29 DIAGNOSIS — E559 Vitamin D deficiency, unspecified: Secondary | ICD-10-CM | POA: Diagnosis not present

## 2020-08-29 DIAGNOSIS — H9313 Tinnitus, bilateral: Secondary | ICD-10-CM | POA: Insufficient documentation

## 2020-08-29 DIAGNOSIS — Z9109 Other allergy status, other than to drugs and biological substances: Secondary | ICD-10-CM | POA: Diagnosis not present

## 2020-08-29 DIAGNOSIS — R059 Cough, unspecified: Secondary | ICD-10-CM

## 2020-08-29 DIAGNOSIS — N182 Chronic kidney disease, stage 2 (mild): Secondary | ICD-10-CM

## 2020-08-29 DIAGNOSIS — E6609 Other obesity due to excess calories: Secondary | ICD-10-CM

## 2020-08-29 DIAGNOSIS — J302 Other seasonal allergic rhinitis: Secondary | ICD-10-CM

## 2020-08-29 HISTORY — DX: Tinnitus, bilateral: H93.13

## 2020-08-29 HISTORY — DX: Chronic kidney disease, stage 2 (mild): N18.2

## 2020-08-29 MED ORDER — WEGOVY 0.25 MG/0.5ML ~~LOC~~ SOAJ: 0.2500 mg | SUBCUTANEOUS | 0 refills | Status: DC

## 2020-08-29 MED ORDER — VITAMIN D (ERGOCALCIFEROL) 1.25 MG (50000 UNIT) PO CAPS: 50000.0000 [IU] | ORAL_CAPSULE | ORAL | 1 refills | Status: DC

## 2020-08-29 MED ORDER — FEXOFENADINE HCL 180 MG PO TABS: 180.0000 mg | ORAL_TABLET | Freq: Every day | ORAL | 3 refills | Status: AC

## 2020-08-29 NOTE — Progress Notes (Signed)
Subjective:    Patient ID: Pam Gomez, female    DOB: 1965/05/07, 55 y.o.   MRN: [id number]  HPI Pt is a 55 yo obese female with seasonal and environmental allergies, SLE, chronic fatigue, tinnitus, CKD who presents to the clinic for follow up.   Patient would like to discuss paperwork to be filled out for service-connected disability for the [employer].  Per patient she was diagnosed with environmental and seasonal allergies after 1985 when she was in the [place] for extended amount of time. She has multiple office visits where she sought care for SOB, wheezing, cough, chest tightness. She has seen Dr. Brock Ra at [place] and had allergy panel done. (Allergic too mold, dust, grass, dust mites)She received allergy shots for over a year. She stopped due to insurance coverage. She has tried numerous anti-histamines. Allegra seems to work the best. She uses her albuterol inhaler about 2-3 times a week.   No CXR since 2016.  She is interested in trying something for weight loss.  .. Active Ambulatory Problems    Diagnosis Date Noted   Systemic lupus erythematosus ([place]) 03/21/2015   Sickle cell trait ([place]) 03/21/2015   History of non anemic vitamin B12 deficiency 03/21/2015   Chest discomfort 03/21/2015   Chronic fatigue 03/21/2015   H/O colonoscopy 03/29/2015   Bilateral chronic knee pain 07/12/2015   Elevated platelet count 07/12/2015   History of shingles 12/10/2016   Environmental allergies 12/10/2016   Class 2 obesity due to excess calories without serious comorbidity with body mass index (BMI) of 36.0 to 36.9 in adult 12/10/2016   Vitamin D insufficiency 12/10/2016   Change in stool caliber 02/19/2017   Rupture of quadriceps tendon, sequela 07/05/2014   Chronic pain of right knee 12/16/2018   Elevated LDL cholesterol level 03/19/2019   Acute pain of left shoulder 03/22/2020   Gastroesophageal reflux disease 03/29/2020   Epigastric discomfort 03/29/2020   Tinnitus of  both ears 08/29/2020   CKD (chronic kidney disease), 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     Objective:   Physical Exam Vitals reviewed.  Constitutional:      Appearance: Normal appearance. She is obese.  HENT:     Head: Normocephalic.  Cardiovascular:     Rate and Rhythm: Normal rate and regular rhythm.     Pulses: Normal pulses.     Heart sounds: Normal heart sounds.  Pulmonary:     Effort: Pulmonary effort is normal.     Breath sounds: Normal breath sounds.  Musculoskeletal:     Right lower leg: No edema.     Left lower leg: No edema.  Neurological:     General: No focal deficit present.     Mental Status: She is alert.  Psychiatric:        Mood and Affect: Mood normal.  .. Depression screen [facility] 2/9 03/22/2020 03/18/2019 07/28/2018 02/24/2018 04/19/2017  Decreased Interest 0 0 0 0 0  Down, Depressed, Hopeless 0 0 0 0 0  PHQ - 2 Score 0 0 0 0 0  Altered sleeping 1 0 0 - -  Tired, decreased energy 1 0 0 - -  Change in appetite 0 1 0 - -  Feeling bad  or failure about yourself  0 0 0 - -  Trouble concentrating 0 0 0 - -  Moving slowly or fidgety/restless 0 0 0 - -  Suicidal thoughts 0 0 0 - -  PHQ-9 Score 2 1 0 - -  Difficult doing work/chores Not difficult at all Not difficult at all Not difficult at all - -   .Marland Kitchen GAD 7 : Generalized Anxiety Score 03/22/2020 03/18/2019 07/28/2018  Nervous, Anxious, on Edge 0 0 0  Control/stop worrying 0 0 0  Worry too much - different things 0 0 0  Trouble relaxing 0 0 1  Restless 0 0 0  Easily annoyed or irritable 0 0 0  Afraid - awful might happen 0 0 0  Total GAD 7 Score 0 0 1  Anxiety Difficulty Not difficult at all Not difficult at all Somewhat difficult             Assessment & Plan:  .Pam Gomez was seen today for vitamin d deficiency and disability paperwork.  Diagnoses and all orders for this visit:  Environmental allergies -     fexofenadine (ALLEGRA) 180 MG tablet; Take 1 tablet (180 mg total) by mouth daily.  Vitamin D insufficiency -     Vitamin D, Ergocalciferol, (DRISDOL) 1.25 MG (50000 UNIT) CAPS capsule; Take 1 capsule (50,000 Units total) by mouth once a week. Labs for refills  History of non anemic vitamin B12 deficiency  Tinnitus of both ears -     fexofenadine (ALLEGRA) 180 MG tablet; Take 1 tablet (180 mg total) by mouth daily.  CKD (chronic kidney disease), stage II  Cough -     fexofenadine (ALLEGRA) 180 MG tablet; Take 1 tablet (180 mg total) by mouth daily. -     Ambulatory referral to Pulmonology  Chest tightness -     fexofenadine (ALLEGRA) 180 MG tablet; Take 1 tablet (180 mg total) by mouth daily. -     Ambulatory referral to Pulmonology  Wheezing -     fexofenadine (ALLEGRA) 180 MG tablet; Take 1 tablet (180 mg total) by mouth daily.  Seasonal allergies -     fexofenadine (ALLEGRA) 180 MG tablet; Take 1 tablet (180 mg total) by mouth daily.  Class 1 obesity due to excess calories without serious comorbidity with body mass index (BMI) of 34.0 to 34.9 in adult -     Semaglutide-Weight Management (WEGOVY) 0.25 MG/0.5ML SOAJ; Inject 0.25 mg into the skin once a week.  I agreed to write letter. She needs to get me dates when she first started having problems 1985 and on. Any office visits or specialist appts and all medication tried. It does appear that her allergies and symptoms did start from this extended stay in [place]. Sent to pulmonology as well for PFT. She is not on daily ICS inhaler. Could consider adding singulair to allegra.   Marland Kitchen.Discussed low carb diet with 1500 calories and 80g of protein.  Exercising at least 150 minutes a week.  [employer] could be a Microbiologist.  Started [employer]. Discussed side  effects and titration up. Call if covered and would like to continue for 2nd month.  Follow up in 3 months.    Spent 30 minutes with patient discussing VA disability papers and letter.

## 2020-08-31 ENCOUNTER — Encounter: Admitting: Sports Medicine

## 2020-08-31 ENCOUNTER — Telehealth: Payer: Self-pay | Admitting: Physician Assistant

## 2020-08-31 DIAGNOSIS — S161XXD Strain of muscle, fascia and tendon at neck level, subsequent encounter: Secondary | ICD-10-CM

## 2020-08-31 DIAGNOSIS — S29012D Strain of muscle and tendon of back wall of thorax, subsequent encounter: Secondary | ICD-10-CM
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
## 2020-09-01 ENCOUNTER — Other Ambulatory Visit: Payer: Self-pay

## 2020-09-01 ENCOUNTER — Encounter: Payer: Self-pay | Admitting: Rehabilitative and Restorative Service Providers"

## 2020-09-01 ENCOUNTER — Ambulatory Visit (INDEPENDENT_AMBULATORY_CARE_PROVIDER_SITE_OTHER): Admitting: Rehabilitative and Restorative Service Providers"

## 2020-09-01 DIAGNOSIS — R293 Abnormal posture: Secondary | ICD-10-CM

## 2020-09-01 DIAGNOSIS — M539 Dorsopathy, unspecified: Secondary | ICD-10-CM | POA: Diagnosis not present

## 2020-09-01 DIAGNOSIS — R29898 Other symptoms and signs involving the musculoskeletal system: Secondary | ICD-10-CM
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
## 2020-09-05 ENCOUNTER — Other Ambulatory Visit: Payer: Self-pay

## 2020-09-05 ENCOUNTER — Ambulatory Visit (INDEPENDENT_AMBULATORY_CARE_PROVIDER_SITE_OTHER): Admitting: Physical Therapy

## 2020-09-05 DIAGNOSIS — R29898 Other symptoms and signs involving the musculoskeletal system: Secondary | ICD-10-CM

## 2020-09-05 DIAGNOSIS — R293 Abnormal posture: Secondary | ICD-10-CM

## 2020-09-05 DIAGNOSIS — M539 Dorsopathy, unspecified: Secondary | ICD-10-CM

## 2020-09-05 NOTE — Therapy (Signed)
[place], [place], [address] Phone: [phone]   Fax:  [phone]  Physical Therapy Treatment  Patient Details  Name: Pam Gomez MRN: [id number] Date of Birth: [birth year] Referring Provider (PT): Iran Planas, [place]   Encounter Date: 09/05/2020   PT End of Session - 09/05/20 [id number]     Visit Number 2    Number of Visits 12    Date for PT Re-Evaluation 10/13/20    PT Start Time 0716    PT Stop Time 0758    PT Time Calculation (min) 42 min    Activity Tolerance Patient tolerated treatment well    Behavior During Therapy [facility] for tasks assessed/performed             Past Medical History:  Diagnosis Date   Sickle cell trait ([place]) 03/21/2015   Systemic lupus erythematosus ([place]) 03/21/2015    Past Surgical History:  Procedure Laterality Date   CESAREAN SECTION  2002   QUADRICEPS TENDON REPAIR  07/06/2000    There were no vitals filed for this visit.   Subjective Assessment - 09/05/20 0719     Subjective Pt reports she has been doing her exercises. She reports no pain, just tightness.    Pertinent History Rt knee patellar tendon rupture and repair; LBP; arthritis    Patient Stated Goals get rid of the pain    Currently in Pain? No/denies    Pain Score 0-No pain                [facility] PT Assessment - 09/05/20 0001       Assessment   Medical Diagnosis Cervical dysfunction    Referring Provider (PT) Iran Planas, PA-C    Onset Date/Surgical Date 07/18/20    Hand Dominance Right    Next MD Visit 09/07/20    Prior Therapy here for LE; back pain; neck pain  2018; 2019; 2021              [facility] Adult PT Treatment/Exercise - 09/05/20 0001       Self-Care   Posture reviewed ball massage to Lt pec and posterior shoulder; pt returned demo with cues.      Neck Exercises: Machines for Strengthening   UBE (Upper Arm Bike) L2: 1 min foward/ 1 min backward, standing       Shoulder Exercises: Supine   Other Supine Exercises scap squeeze (laying on noodle) x 5 sec x 3.      Shoulder Exercises: Standing   Other Standing Exercises axial extension 5 sec x 5 reps; scap squeeze 10 sec x 5 reps; L's x 10, W's x 10 all with foam roll along spine    Other Standing Exercises backward shoulder rolls      Shoulder Exercises: Stretch   Star Gazer Stretch 2 reps;20 seconds    Other Shoulder Stretches doorway stretch 3 positions 30 sec hold x 2 reps, bilat bicep stretch at door x 20 sec    Other Shoulder Stretches thoracic ext over back of chair, with hands supporting head x 5 reps of 10 sec, prolonged stretch in hooklying on pool noodle, arms abdct ~60 deg, then with lower trunk rotation Rt for Lt pec/lowback stretch. 2 active snow angels - not tolerated.      Manual Therapy   Manual Therapy Soft tissue mobilization    Manual therapy comments I strips of reg Rock tape applied in X pattern to Lt posterior shoulder at infraspinatus insertion  area, stretching over towards lateral deltoid with 25-40% stretch - to decompress tissue and increase proprioception.    Soft tissue mobilization STM to Lt pec, IASTM to Lt deltoid and infraspinatus insertion area.                    PT Education - 09/05/20 0824     Education Details Info on ktape; log in infor for medbridge.  Texted medbridge log-in through website.    Person(s) Educated Patient    Methods Explanation;Handout    Comprehension Verbalized understanding                 PT Long Term Goals - 09/01/20 1816       PT LONG TERM GOAL #1   Title Improve posture and alignment with patient to demonstrate improved upright posture with posterior shoulder girdle engaged    Time 6    Period Weeks    Status New    Target Date 10/12/20      PT LONG TERM GOAL #2   Title Full pain free cervical ROM/mobility    Time 6    Period Weeks    Status New    Target Date 10/13/20      PT LONG TERM GOAL #3   Title  Patient to demonstrate/verbalize proper sitting posture and ergonomics for desk and computer work    Time 6    Period Weeks    Status New    Target Date 10/13/20      PT LONG TERM GOAL #4   Title Patient reports return to normal functional activities with minimal to no pain (pain </= 0-2/10 on 0 to 10 scale)    Time 6    Period Weeks    Status New    Target Date 10/13/20      PT LONG TERM GOAL #5   Title Independent in HEP (including aquatic program as indicated)    Time 6    Period Weeks    Status New    Target Date 10/13/20      PT LONG TERM GOAL #7   Title Improve functional limitation score to 59    Time 6    Period Weeks    Status New    Target Date 10/13/20                   Plan - 09/05/20 0823     Clinical Impression Statement Improved pain level since initiating exercises from eval. Lt pec remains tight and Lt posterior shoulder is tender with palpation.  Moderate cues for posture/form during session.  Goals are ongoing.    Stability/Clinical Decision Making Stable/Uncomplicated    Rehab Potential Good    PT Frequency 2x / week    PT Duration 6 weeks    PT Treatment/Interventions ADLs/Self Care Home Management;Aquatic Therapy;Cryotherapy;Electrical Stimulation;Iontophoresis '4mg'$ /ml Dexamethasone;Moist Heat;Ultrasound;Therapeutic activities;Therapeutic exercise;Neuromuscular re-education;Patient/family education;Manual techniques;Dry needling    PT Next Visit Plan review HEP; continue with postural correction working on thoracic extension; education re posture and work ergonomics with frequent breaks to stretch and move; manual work  cervical and thoracic spine (does not like DN historically); modalities as indicated (has TENs unit at home)    PT [place] and Agree with Plan of Care Patient             Patient will benefit from skilled therapeutic intervention in order to improve the following deficits and impairments:   Decreased  activity tolerance, Pain, Decreased range of motion, Hypermobility, Impaired flexibility, Postural dysfunction  Visit Diagnosis: Cervical dysfunction  Other symptoms and signs involving the musculoskeletal system  Abnormal posture     Problem List Patient Active Problem List   Diagnosis Date Noted   Tinnitus of both ears 08/29/2020   CKD (chronic kidney disease), 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   Gastroesophageal reflux disease 03/29/2020   Epigastric discomfort 03/29/2020   Acute pain of left shoulder 03/22/2020   Elevated LDL cholesterol level 03/19/2019   Chronic pain of right knee 12/16/2018   Change in stool caliber 02/19/2017   History of shingles 12/10/2016   Environmental allergies 12/10/2016   Class 2 obesity due to excess calories without serious comorbidity with body mass index (BMI) of 36.0 to 36.9 in adult 12/10/2016   Vitamin D insufficiency 12/10/2016   Bilateral chronic knee pain 07/12/2015   Elevated platelet count 07/12/2015   H/O colonoscopy 03/29/2015   Systemic lupus erythematosus ([place]) 03/21/2015   Sickle cell trait ([place]) 03/21/2015   History of non anemic vitamin B12 deficiency 03/21/2015   Chest discomfort 03/21/2015   Chronic fatigue 03/21/2015   Rupture of quadriceps tendon, sequela 07/05/2014    Kerin Perna, PTA 09/05/20 8:27 AM   [place] [address] [place], [place], [address] Phone: [phone]   Fax:  [phone]  Name: RANDYL LINWOOD MRN: [id number] Date of Birth: [birth year]

## 2020-09-05 NOTE — Patient Instructions (Signed)

## 2020-09-06 DIAGNOSIS — R059 Cough, unspecified: Secondary | ICD-10-CM | POA: Insufficient documentation

## 2020-09-06 DIAGNOSIS — R0789 Other chest pain: Secondary | ICD-10-CM

## 2020-09-06 DIAGNOSIS — R062 Wheezing: Secondary | ICD-10-CM

## 2020-09-06 HISTORY — DX: Other chest pain: R07.89

## 2020-09-06 HISTORY — DX: Wheezing: R06.2

## 2020-09-07 ENCOUNTER — Encounter: Payer: Self-pay | Admitting: Rehabilitative and Restorative Service Providers"

## 2020-09-07 ENCOUNTER — Ambulatory Visit (INDEPENDENT_AMBULATORY_CARE_PROVIDER_SITE_OTHER): Admitting: Sports Medicine

## 2020-09-07 ENCOUNTER — Other Ambulatory Visit: Payer: Self-pay

## 2020-09-07 ENCOUNTER — Ambulatory Visit (INDEPENDENT_AMBULATORY_CARE_PROVIDER_SITE_OTHER): Admitting: Rehabilitative and Restorative Service Providers"

## 2020-09-07 DIAGNOSIS — M5030 Other cervical disc degeneration, unspecified cervical region: Secondary | ICD-10-CM

## 2020-09-07 DIAGNOSIS — M539 Dorsopathy, unspecified: Secondary | ICD-10-CM

## 2020-09-07 DIAGNOSIS — R29898 Other symptoms and signs involving the musculoskeletal system: Secondary | ICD-10-CM

## 2020-09-07 DIAGNOSIS — R293 Abnormal posture: Secondary | ICD-10-CM | POA: Diagnosis not present

## 2020-09-07 HISTORY — DX: Other cervical disc degeneration, unspecified cervical region: M50.30

## 2020-09-07 MED ORDER — MELOXICAM 15 MG PO TABS: ORAL_TABLET | ORAL | 3 refills | Status: DC
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
## 2020-09-07 NOTE — Patient Instructions (Signed)
Access Code: [email or web address] URL: [email or web address] Date: 09/07/2020 Prepared by: Gillermo Murdoch  Exercises Seated Cervical Retraction - 3 x daily - 7 x weekly - 10 reps - 1 sets Standing Scapular Retraction - 3 x daily - 7 x weekly - 10 reps - 1 sets - 10 hold Shoulder External Rotation and Scapular Retraction - 3 x daily - 7 x weekly - 1 sets - 10 reps - 2-3 hold Doorway Pec Stretch at 60 Degrees Abduction - 3 x daily - 7 x weekly - 3 reps - 1 sets Doorway Pec Stretch at 90 Degrees Abduction - 3 x daily - 7 x weekly - 3 reps - 1 sets - 30 seconds hold Doorway Pec Stretch at 120 Degrees Abduction - 3 x daily - 7 x weekly - 3 reps - 1 sets - 30 second hold hold Standing Backward Shoulder Rolls - 2 x daily - 7 x weekly - 1 sets - 10 reps - 1-2 sec hold Shoulder External Rotation and Scapular Retraction with Resistance - 2 x daily - 7 x weekly - 3 sets - 10 reps - 3 sec hold Scapular Retraction with Resistance - 2 x daily - 7 x weekly - 3 sets - 10 reps - 3 sec hold Scapular Retraction with Resistance Advanced - 2 x daily - 7 x weekly - 3 sets - 10 reps - 3 sec hold
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
## 2020-09-07 NOTE — Therapy (Signed)
[place], [place], [address] Phone: [phone]   Fax:  [phone]  Physical Therapy Treatment  Patient Details  Name: Pam Gomez MRN: [id number] Date of Birth: [birth year] Referring Provider (PT): Iran Planas, [place]   Encounter Date: 09/07/2020   PT End of Session - 09/07/20 1525     Visit Number 3    Number of Visits 12    Date for PT Re-Evaluation 10/13/20    PT Start Time [id number]    PT Stop Time [id number]    PT Time Calculation (min) 41 min    Activity Tolerance Patient tolerated treatment well             Past Medical History:  Diagnosis Date   Sickle cell trait ([place]) 03/21/2015   Systemic lupus erythematosus ([place]) 03/21/2015    Past Surgical History:  Procedure Laterality Date   CESAREAN SECTION  2002   QUADRICEPS TENDON REPAIR  07/06/2000    There were no vitals filed for this visit.   Subjective Assessment - 09/07/20 1526     Subjective Patient reports that she is mentally exhausted today. Neck is tight.    Currently in Pain? Yes    Pain Score 4     Pain Location Neck    Pain Orientation Left    Pain Descriptors / Indicators Tightness    Pain Type Acute pain                               [facility] Adult PT Treatment/Exercise - 09/07/20 0001       Neck Exercises: Machines for Strengthening   UBE (Upper Arm Bike) L4 x 3 min 1 min fwd/1 back; 30 sec fwd/30 sec back      Shoulder Exercises: Supine   Other Supine Exercises scap squeeze/chest lift 10 sec hold x 10 reps      Shoulder Exercises: Standing   Extension Strengthening;Both;10 reps;Theraband    Theraband Level (Shoulder Extension) Level 2 (Red)    Row Strengthening;Both;10 reps;Theraband    Theraband Level (Shoulder Row) Level 2 (Red)    Retraction Strengthening;Both;10 reps;Theraband    Theraband Level (Shoulder Retraction) Level 2 (Red)    Other Standing Exercises axial extension 5 sec x 5  reps; scap squeeze 10 sec x 5 reps; L's x 10, W's x 10 all with foam roll along spine    Other Standing Exercises backward shoulder rolls      Shoulder Exercises: Stretch   Other Shoulder Stretches doorway stretch 3 positions 30 sec hold x 2 reps, bilat bicep stretch at door x 20 sec      Moist Heat Therapy   Number Minutes Moist Heat 10 Minutes    Moist Heat Location Cervical   thoracic     Manual Therapy   Joint Mobilization cervical PA mobs    Soft tissue mobilization deep tissue work through the pecs; ant/lat/posterior cervical musculature; upper traps    Myofascial Release upper trap    Manual Traction 20 sec x 2 reps    Other Manual Therapy PROM and stretching into cervical flexion and lateral flexion to Rt                    PT Education - 09/07/20 1538     Education Details HEP    Person(s) Educated Patient    Methods Explanation;Demonstration;Tactile cues;Verbal cues;Other (comment)   email  Comprehension Verbalized understanding;Returned demonstration;Verbal cues required;Tactile cues required                 PT Long Term Goals - 09/01/20 1816       PT LONG TERM GOAL #1   Title Improve posture and alignment with patient to demonstrate improved upright posture with posterior shoulder girdle engaged    Time 6    Period Weeks    Status New    Target Date 10/12/20      PT LONG TERM GOAL #2   Title Full pain free cervical ROM/mobility    Time 6    Period Weeks    Status New    Target Date 10/13/20      PT LONG TERM GOAL #3   Title Patient to demonstrate/verbalize proper sitting posture and ergonomics for desk and computer work    Time 6    Period Weeks    Status New    Target Date 10/13/20      PT LONG TERM GOAL #4   Title Patient reports return to normal functional activities with minimal to no pain (pain </= 0-2/10 on 0 to 10 scale)    Time 6    Period Weeks    Status New    Target Date 10/13/20      PT LONG TERM GOAL #5   Title  Independent in HEP (including aquatic program as indicated)    Time 6    Period Weeks    Status New    Target Date 10/13/20      PT LONG TERM GOAL #7   Title Improve functional limitation score to 59    Time 6    Period Weeks    Status New    Target Date 10/13/20                   Plan - 09/07/20 1533     Clinical Impression Statement Cotninued tightness and pain in the Lt neck and anterior chest area. Continued with stretching. Added strengthening with TB without difficulty.    Rehab Potential Good    PT Frequency 2x / week    PT Duration 6 weeks    PT Treatment/Interventions ADLs/Self Care Home Management;Aquatic Therapy;Cryotherapy;Electrical Stimulation;Iontophoresis '4mg'$ /ml Dexamethasone;Moist Heat;Ultrasound;Therapeutic activities;Therapeutic exercise;Neuromuscular re-education;Patient/family education;Manual techniques;Dry needling    PT Next Visit Plan review HEP; continue with postural correction working on thoracic extension; education re posture and work ergonomics with frequent breaks to stretch and move; manual work  cervical and thoracic spine (does not like DN historically); modalities as indicated (has TENs unit at home)    PT Challenge-[place] and Agree with Plan of Care Patient             Patient will benefit from skilled therapeutic intervention in order to improve the following deficits and impairments:     Visit Diagnosis: Cervical dysfunction  Other symptoms and signs involving the musculoskeletal system  Abnormal posture     Problem List Patient Active Problem List   Diagnosis Date Noted   Wheezing 09/06/2020   Chest tightness 09/06/2020   Cough 09/06/2020   Tinnitus of both ears 08/29/2020   CKD (chronic kidney disease), 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   Gastroesophageal reflux disease 03/29/2020   Epigastric discomfort 03/29/2020   Acute pain of left shoulder 03/22/2020   Elevated LDL cholesterol level 03/19/2019    Chronic pain of right knee 12/16/2018   Change in stool caliber 02/19/2017  History of shingles 12/10/2016   Environmental allergies 12/10/2016   Class 2 obesity due to excess calories without serious comorbidity with body mass index (BMI) of 36.0 to 36.9 in adult 12/10/2016   Vitamin D insufficiency 12/10/2016   Bilateral chronic knee pain 07/12/2015   Elevated platelet count 07/12/2015   H/O colonoscopy 03/29/2015   Systemic lupus erythematosus ([place]) 03/21/2015   Sickle cell trait ([place]) 03/21/2015   History of non anemic vitamin B12 deficiency 03/21/2015   Chest discomfort 03/21/2015   Chronic fatigue 03/21/2015   Rupture of quadriceps tendon, sequela 07/05/2014    Kenadie Royce Nilda Simmer PT, MPH  09/07/2020, 3:56 PM  [facility] [place], [place], [address] Phone: [phone]   Fax:  [phone]  Name: Pam Gomez MRN: [id number] Date of Birth: [birth year]

## 2020-09-09 ENCOUNTER — Telehealth: Payer: Self-pay

## 2020-09-09 NOTE — Telephone Encounter (Signed)
Medication: Wegovy 0.'25mg'$ /0.23m, 0.25 mg qwk Prior authorization submitted via CoverMyMeds PA submission pending

## 2020-09-11 ENCOUNTER — Other Ambulatory Visit: Payer: Self-pay

## 2020-09-11 ENCOUNTER — Other Ambulatory Visit

## 2020-09-11 ENCOUNTER — Ambulatory Visit (INDEPENDENT_AMBULATORY_CARE_PROVIDER_SITE_OTHER)

## 2020-09-11 DIAGNOSIS — M25512 Pain in left shoulder: Secondary | ICD-10-CM

## 2020-09-11 DIAGNOSIS — M5030 Other cervical disc degeneration, unspecified cervical region: Secondary | ICD-10-CM

## 2020-09-11 DIAGNOSIS — M542 Cervicalgia: Secondary | ICD-10-CM

## 2020-09-11 DIAGNOSIS — M6281 Muscle weakness (generalized): Secondary | ICD-10-CM | POA: Diagnosis not present

## 2020-09-11 DIAGNOSIS — M5412 Radiculopathy, cervical region: Secondary | ICD-10-CM

## 2020-09-12 ENCOUNTER — Encounter: Admitting: Rehabilitative and Restorative Service Providers"

## 2020-09-12 NOTE — Telephone Encounter (Signed)
Medication: Wegovy 0.25 mg/0.5 mL  Prior authorization submitted via CoverMyMeds on 09/09/20 PA submission still pending

## 2020-09-13 NOTE — Telephone Encounter (Signed)
Medication: Wegovy 0.25 mg/0.5 mL  Prior authorization determination received via [email or web address] Medication has been denied Reason for denial:  Denial letter pending

## 2020-09-14 ENCOUNTER — Other Ambulatory Visit: Payer: Self-pay

## 2020-09-14 ENCOUNTER — Ambulatory Visit (INDEPENDENT_AMBULATORY_CARE_PROVIDER_SITE_OTHER): Admitting: Physical Therapy

## 2020-09-14 DIAGNOSIS — R293 Abnormal posture: Secondary | ICD-10-CM

## 2020-09-14 DIAGNOSIS — R29898 Other symptoms and signs involving the musculoskeletal system: Secondary | ICD-10-CM | POA: Diagnosis not present

## 2020-09-14 DIAGNOSIS — M539 Dorsopathy, unspecified: Secondary | ICD-10-CM

## 2020-09-14 NOTE — Telephone Encounter (Signed)
It is best if you see what your insurance will cover. Likely will not cover branded drugs. We could do plenity which does not go through insurance but taken before lunch and dinner to expand your stomach to keep full. Thoughts?

## 2020-09-14 NOTE — Telephone Encounter (Signed)
Patient aware of PA denial. Pt states "we can try something else". No further questions or concerns at this time.

## 2020-09-14 NOTE — Telephone Encounter (Signed)
LVM[facility]C (1st attempt)   

## 2020-09-14 NOTE — Therapy (Signed)
[place], [place], [address] Phone: [phone]   Fax:  [phone]  Physical Therapy Treatment  Patient Details  Name: Pam Gomez MRN: [id number] Date of Birth: 1965/09/01 Referring Provider (PT): Iran Planas, [place]   Encounter Date: 09/14/2020   PT End of Session - 09/14/20 1607     Visit Number 4    Number of Visits 12    Date for PT Re-Evaluation 10/13/20    PT Start Time [id number]    PT Stop Time 1655   MHP last 10 min, no charge   PT Time Calculation (min) 52 min    Activity Tolerance Patient tolerated treatment well             Past Medical History:  Diagnosis Date   Sickle cell trait ([place]) 03/21/2015   Systemic lupus erythematosus ([place]) 03/21/2015    Past Surgical History:  Procedure Laterality Date   CESAREAN SECTION  2002   QUADRICEPS TENDON REPAIR  07/06/2000    There were no vitals filed for this visit.   Subjective Assessment - 09/14/20 1608     Subjective "My neck is twinge-ie".  Pt feels her stress is contributing to neck tightness.    Pertinent History Rt knee patellar tendon rupture and repair; LBP; arthritis    Currently in Pain? Yes    Pain Score 4     Pain Location Neck    Pain Orientation Left    Pain Descriptors / Indicators Tightness;Throbbing    Aggravating Factors  working at computer    Pain Relieving Factors stretching                [facility] PT Assessment - 09/14/20 0001       Assessment   Medical Diagnosis Cervical dysfunction    Referring Provider (PT) Iran Planas, PA-C    Onset Date/Surgical Date 07/18/20    Hand Dominance Right    Prior Therapy here for LE; back pain; neck pain  2018; 2019; 2021              [facility] Adult PT Treatment/Exercise - 09/14/20 0001       Neck Exercises: Machines for Strengthening   Nustep L4-5: 5 min, arms only for warm up.      Shoulder Exercises: Supine   Other Supine Exercises bilat shoulder ER  with red band and scap squeeze x 15, bilat flexion holding red band x 10- for thoracic ext.  diagonals x 10 each arm with red band.      Shoulder Exercises: Stretch   Other Shoulder Stretches doorway stretch 3 positions 30 sec hold x 2 reps,      Moist Heat Therapy   Number Minutes Moist Heat 10 Minutes    Moist Heat Location Cervical   thoracic     Manual Therapy   Soft tissue mobilization STM to bilat cervical erectors, upper trap, levator.    Myofascial Release suboccipital release.    Manual Traction 20 sec x 4 reps    Other Manual Therapy PROM and stretching into cervical flexion and lateral flexion to Lt                         PT Long Term Goals - 09/01/20 1816       PT LONG TERM GOAL #1   Title Improve posture and alignment with patient to demonstrate improved upright posture with posterior shoulder girdle engaged  Time 6    Period Weeks    Status New    Target Date 10/12/20      PT LONG TERM GOAL #2   Title Full pain free cervical ROM/mobility    Time 6    Period Weeks    Status New    Target Date 10/13/20      PT LONG TERM GOAL #3   Title Patient to demonstrate/verbalize proper sitting posture and ergonomics for desk and computer work    Time 6    Period Weeks    Status New    Target Date 10/13/20      PT LONG TERM GOAL #4   Title Patient reports return to normal functional activities with minimal to no pain (pain </= 0-2/10 on 0 to 10 scale)    Time 6    Period Weeks    Status New    Target Date 10/13/20      PT LONG TERM GOAL #5   Title Independent in HEP (including aquatic program as indicated)    Time 6    Period Weeks    Status New    Target Date 10/13/20      PT LONG TERM GOAL #7   Title Improve functional limitation score to 59    Time 6    Period Weeks    Status New    Target Date 10/13/20                   Plan - 09/14/20 1647     Clinical Impression Statement Continued pain in Lt neck and shoulder.   Increased palpable tightness in Rt paraspinals and upper trap. She responded well to manual traction; may benefit from mechanical traction in future sessions.  Pt reported mild relief of pain with doorway stretch.  Will continue to progress towards LTGs.    Rehab Potential Good    PT Frequency 2x / week    PT Duration 6 weeks    PT Treatment/Interventions ADLs/Self Care Home Management;Aquatic Therapy;Cryotherapy;Electrical Stimulation;Iontophoresis '4mg'$ /ml Dexamethasone;Moist Heat;Ultrasound;Therapeutic activities;Therapeutic exercise;Neuromuscular re-education;Patient/family education;Manual techniques;Dry needling    PT Next Visit Plan Assess neck ROM.  continue with postural correction working on thoracic extension; manual work  cervical and thoracic spine (does not like DN historically); modalities as indicated (has TENs unit at home).    PT Home Exercise Plan [id number]    Consulted and Agree with Plan of Care Patient             Patient will benefit from skilled therapeutic intervention in order to improve the following deficits and impairments:  Decreased activity tolerance, Pain, Decreased range of motion, Hypermobility, Impaired flexibility, Postural dysfunction  Visit Diagnosis: Cervical dysfunction  Other symptoms and signs involving the musculoskeletal system  Abnormal posture     Problem List Patient Active Problem List   Diagnosis Date Noted   Disc disease, degenerative, cervical 09/07/2020   Wheezing 09/06/2020   Chest tightness 09/06/2020   Cough 09/06/2020   Tinnitus of both ears 08/29/2020   CKD (chronic kidney disease), 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   Gastroesophageal reflux disease 03/29/2020   Epigastric discomfort 03/29/2020   Acute pain of left shoulder 03/22/2020   Elevated LDL cholesterol level 03/19/2019   Chronic pain of right knee 12/16/2018   Change in stool caliber 02/19/2017   History of shingles 12/10/2016   Environmental allergies 12/10/2016   Class 2  obesity due to excess calories without serious comorbidity with body mass index (BMI) of 36.0 to  36.9 in adult 12/10/2016   Vitamin D insufficiency 12/10/2016   Bilateral chronic knee pain 07/12/2015   Elevated platelet count 07/12/2015   H/O colonoscopy 03/29/2015   Systemic lupus erythematosus ([place]) 03/21/2015   Sickle cell trait ([place]) 03/21/2015   History of non anemic vitamin B12 deficiency 03/21/2015   Chest discomfort 03/21/2015   Chronic fatigue 03/21/2015   Rupture of quadriceps tendon, sequela 07/05/2014    Kerin Perna, PTA 09/14/20 5:03 PM   [facility] [place], [place], [address] Phone: [phone]   Fax:  [phone]  Name: Pam Gomez MRN: [id number] Date of Birth: Nov 06, 1965

## 2020-09-15 ENCOUNTER — Encounter: Payer: Self-pay | Admitting: Physician Assistant

## 2020-09-15 NOTE — Telephone Encounter (Signed)
Pt aware. She would like to have more info regarding Penity. I advised her to look at info online. She is wondering about the side effects and what Jade's thoughts are regarding the medication.   When calling pt back, okay to leave info on VM

## 2020-09-19 ENCOUNTER — Other Ambulatory Visit: Payer: Self-pay

## 2020-09-19 ENCOUNTER — Encounter: Payer: Self-pay | Admitting: Rehabilitative and Restorative Service Providers"

## 2020-09-19 ENCOUNTER — Ambulatory Visit (INDEPENDENT_AMBULATORY_CARE_PROVIDER_SITE_OTHER): Admitting: Rehabilitative and Restorative Service Providers"

## 2020-09-19 DIAGNOSIS — M539 Dorsopathy, unspecified: Secondary | ICD-10-CM

## 2020-09-19 DIAGNOSIS — R29898 Other symptoms and signs involving the musculoskeletal system: Secondary | ICD-10-CM | POA: Diagnosis not present

## 2020-09-19 DIAGNOSIS — R293 Abnormal posture: Secondary | ICD-10-CM | POA: Diagnosis not present

## 2020-09-19 NOTE — Therapy (Signed)
[place], [place], [address] Phone: [phone]   Fax:  [phone]  Physical Therapy Treatment  Patient Details  Name: Pam Gomez MRN: [id number] Date of Birth: [birth year] Referring Provider (PT): Iran Planas, [place]   Encounter Date: 09/19/2020   PT End of Session - 09/19/20 1521     Visit Number 5    Number of Visits 12    Date for PT Re-Evaluation 10/13/20    PT Start Time 1519    PT Stop Time 1610    PT Time Calculation (min) 51 min    Activity Tolerance Patient tolerated treatment well             Past Medical History:  Diagnosis Date   Sickle cell trait ([place]) 03/21/2015   Systemic lupus erythematosus ([place]) 03/21/2015    Past Surgical History:  Procedure Laterality Date   CESAREAN SECTION  2002   QUADRICEPS TENDON REPAIR  07/06/2000    There were no vitals filed for this visit.   Subjective Assessment - 09/19/20 1522     Subjective Feeling a "quick muscle spasm" at times.Some tingling in the Lt UE and "a lot of tension in the Rt arm". She is concerned about her mom who is battling cancer.    Currently in Pain? Yes    Pain Score 4     Pain Location Neck    Pain Orientation Left    Pain Descriptors / Indicators Tightness;Tingling    Pain Type Acute pain                [facility] PT Assessment - 09/19/20 0001       Assessment   Medical Diagnosis Cervical dysfunction    Referring Provider (PT) Iran Planas, PA-C    Onset Date/Surgical Date 07/18/20    Hand Dominance Right    Prior Therapy here for LE; back pain; neck pain  2018; 2019; 2021      AROM   Cervical Flexion 58    Cervical Extension 48    Cervical - Right Side Bend 28    Cervical - Left Side Bend 28    Cervical - Right Rotation 54    Cervical - Left Rotation 53      Palpation   Palpation comment muscular tightness thoracic and cervical paraspinals; ant/lat/post cervical musculature; upper traps;  leveator; pecs Lt > Rt      Special Tests   Other special tests (+) neural tension test Lt >Rt UE's                           [facility] Adult PT Treatment/Exercise - 09/19/20 0001       Neck Exercises: Machines for Strengthening   UBE (Upper Arm Bike) L4 x 4 min - 2 min fwd/2 min back      Shoulder Exercises: Supine   Other Supine Exercises axial extension 10 x 5 reps x 2 sets; nodding yes x 10-20reps without lifting head from table      Shoulder Exercises: Stretch   Other Shoulder Stretches doorway stretch 3 positions 30 sec hold x 2 reps,      Moist Heat Therapy   Number Minutes Moist Heat 10 Minutes    Moist Heat Location Cervical   thoracic     Electrical Stimulation   Electrical Stimulation Location Lt cervical and shoulder area    Electrical Stimulation Action TENS    Electrical Stimulation  Parameters to tolerance    Electrical Stimulation Goals Pain;Tone      Manual Therapy   Soft tissue mobilization Lt > Rt ant/lat/posterior cervical musculatue; pecs; upper traps    Myofascial Release Lt upper trap; suboccipital release.    Manual Traction 20-30 sec x 3 reps    Other Manual Therapy PROM and stretching into cervical flexion and lateral flexion to Lt                         PT Long Term Goals - 09/01/20 1816       PT LONG TERM GOAL #1   Title Improve posture and alignment with patient to demonstrate improved upright posture with posterior shoulder girdle engaged    Time 6    Period Weeks    Status New    Target Date 10/12/20      PT LONG TERM GOAL #2   Title Full pain free cervical ROM/mobility    Time 6    Period Weeks    Status New    Target Date 10/13/20      PT LONG TERM GOAL #3   Title Patient to demonstrate/verbalize proper sitting posture and ergonomics for desk and computer work    Time 6    Period Weeks    Status New    Target Date 10/13/20      PT LONG TERM GOAL #4   Title Patient reports return to normal  functional activities with minimal to no pain (pain </= 0-2/10 on 0 to 10 scale)    Time 6    Period Weeks    Status New    Target Date 10/13/20      PT LONG TERM GOAL #5   Title Independent in HEP (including aquatic program as indicated)    Time 6    Period Weeks    Status New    Target Date 10/13/20      PT LONG TERM GOAL #7   Title Improve functional limitation score to 59    Time 6    Period Weeks    Status New    Target Date 10/13/20                   Plan - 09/19/20 1523     Clinical Impression Statement Continued pain in the Lt cervical area and shoulder. She has continued palpable tightness - left ant/lat/post cervical musculature; positive neural tension test. Trial of TENS Lt cervical and shoulder.    Rehab Potential Good    PT Frequency 2x / week    PT Duration 6 weeks    PT Treatment/Interventions ADLs/Self Care Home Management;Aquatic Therapy;Cryotherapy;Electrical Stimulation;Iontophoresis '4mg'$ /ml Dexamethasone;Moist Heat;Ultrasound;Therapeutic activities;Therapeutic exercise;Neuromuscular re-education;Patient/family education;Manual techniques;Dry needling    PT Next Visit Plan continue with postural correction working on thoracic extension; manual work  cervical and thoracic spine (does not like DN historically); modalities as indicated (has TENs unit at home).    PT Home Exercise Plan [id number]    Consulted and Agree with Plan of Care Patient             Patient will benefit from skilled therapeutic intervention in order to improve the following deficits and impairments:     Visit Diagnosis: Cervical dysfunction  Other symptoms and signs involving the musculoskeletal system  Abnormal posture     Problem List Patient Active Problem List   Diagnosis Date Noted   Disc disease, degenerative, cervical 09/07/2020   Wheezing  09/06/2020   Chest tightness 09/06/2020   Cough 09/06/2020   Tinnitus of both ears 08/29/2020   CKD (chronic kidney  disease), 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   Gastroesophageal reflux disease 03/29/2020   Epigastric discomfort 03/29/2020   Acute pain of left shoulder 03/22/2020   Elevated LDL cholesterol level 03/19/2019   Chronic pain of right knee 12/16/2018   Change in stool caliber 02/19/2017   History of shingles 12/10/2016   Environmental allergies 12/10/2016   Class 2 obesity due to excess calories without serious comorbidity with body mass index (BMI) of 36.0 to 36.9 in adult 12/10/2016   Vitamin D insufficiency 12/10/2016   Bilateral chronic knee pain 07/12/2015   Elevated platelet count 07/12/2015   H/O colonoscopy 03/29/2015   Systemic lupus erythematosus ([place]) 03/21/2015   Sickle cell trait ([place]) 03/21/2015   History of non anemic vitamin B12 deficiency 03/21/2015   Chest discomfort 03/21/2015   Chronic fatigue 03/21/2015   Rupture of quadriceps tendon, sequela 07/05/2014    Aadi Bordner Nilda Simmer PT, MPH  09/19/2020, 4:02 PM  [facility] [place], [place], [address] Phone: [phone]   Fax:  [phone]  Name: Pam Gomez MRN: [id number] Date of Birth: 1965-07-20

## 2020-09-22 ENCOUNTER — Other Ambulatory Visit: Payer: Self-pay

## 2020-09-22 ENCOUNTER — Encounter: Payer: Self-pay | Admitting: Physical Therapy

## 2020-09-22 ENCOUNTER — Ambulatory Visit (INDEPENDENT_AMBULATORY_CARE_PROVIDER_SITE_OTHER): Admitting: Physical Therapy

## 2020-09-22 DIAGNOSIS — M539 Dorsopathy, unspecified: Secondary | ICD-10-CM | POA: Diagnosis not present

## 2020-09-22 DIAGNOSIS — R29898 Other symptoms and signs involving the musculoskeletal system: Secondary | ICD-10-CM | POA: Diagnosis not present

## 2020-09-22 DIAGNOSIS — R293 Abnormal posture: Secondary | ICD-10-CM | POA: Diagnosis not present

## 2020-09-22 NOTE — Therapy (Signed)
[place], [place], [address] Phone: [phone]   Fax:  [phone]  Physical Therapy Treatment  Patient Details  Name: Pam Gomez MRN: [id number] Date of Birth: 29-Mar-1965 Referring Provider (PT): Iran Planas, [place]   Encounter Date: 09/22/2020   PT End of Session - 09/22/20 1711     Visit Number 6    Number of Visits 12    Date for PT Re-Evaluation 10/13/20    PT Start Time 1703    PT Stop Time 1746    PT Time Calculation (min) 43 min    Activity Tolerance Patient tolerated treatment well    Behavior During Therapy [facility] for tasks assessed/performed             Past Medical History:  Diagnosis Date   Sickle cell trait ([place]) 03/21/2015   Systemic lupus erythematosus ([place]) 03/21/2015    Past Surgical History:  Procedure Laterality Date   CESAREAN SECTION  2002   QUADRICEPS TENDON REPAIR  07/06/2000    There were no vitals filed for this visit.   Subjective Assessment - 09/22/20 1711     Subjective Pt reports her stress is really affecting her neck. She is unsure if the lupus is causing her joints to be more achy. she is missing part of TENS unit at home, so she has not using it at home.    Patient Stated Goals get rid of the pain    Currently in Pain? Yes    Pain Score 5     Pain Location Neck    Pain Orientation Left    Pain Descriptors / Indicators Tightness;Tender    Pain Radiating Towards up to base of head    Aggravating Factors  stress, working at computer    Pain Relieving Factors stretching, TENS                [facility] PT Assessment - 09/22/20 0001       Assessment   Medical Diagnosis Cervical dysfunction    Referring Provider (PT) Iran Planas, PA-C    Onset Date/Surgical Date 07/18/20    Hand Dominance Right    Prior Therapy here for LE; back pain; neck pain  2018; 2019; 2021              [facility] Adult PT Treatment/Exercise - 09/22/20 0001        Exercises   Exercises Neck      Neck Exercises: Machines for Strengthening   Nustep L5: arms/legs x 5 min for warm up.      Neck Exercises: Seated   Other Seated Exercise seated, leaning on thighs with forearms and 2 reps of cervical flexion for stretch x 5 sec      Neck Exercises: Prone   Axial Exension 5 reps   5 sec   W Back 10 reps    Other Prone Exercise axial ext with scap retraction x 5 sec x 5 reps    Other Prone Exercise POE with cervical flex to/form neutral x 4 reps, then diagonals to Rt for levator stretch x 4 reps      Lumbar Exercises: Quadruped   Other Quadruped Lumbar Exercises childs pose x 10 sec x 2, cat/cow x 3 reps, thread the needle x 2 reps each side.      Shoulder Exercises: Stretch   Other Shoulder Stretches doorway stretch 3 positions 15 sec hold x 2 reps,      Moist Heat Therapy  Number Minutes Moist Heat 10 Minutes    Moist Heat Location Cervical   thoracic     Electrical Stimulation   Electrical Stimulation Location Lt cervical and shoulder area    Electrical Stimulation Action TENS    Electrical Stimulation Parameters to tolerance    Electrical Stimulation Goals Pain                    PT Education - 09/22/20 1739     Education Details TENS info, reviewed diaphragmatic breathing for stress relief.    Person(s) Educated Patient    Methods Explanation;Handout    Comprehension Verbalized understanding                 PT Long Term Goals - 09/22/20 1749       PT LONG TERM GOAL #1   Title Improve posture and alignment with patient to demonstrate improved upright posture with posterior shoulder girdle engaged    Time 6    Period Weeks    Status On-going      PT LONG TERM GOAL #2   Title Full pain free cervical ROM/mobility    Time 6    Period Weeks    Status On-going      PT LONG TERM GOAL #3   Title Patient to demonstrate/verbalize proper sitting posture and ergonomics for desk and computer work    Time 6    Period  Weeks    Status On-going      PT LONG TERM GOAL #4   Title Patient reports return to normal functional activities with minimal to no pain (pain </= 0-2/10 on 0 to 10 scale)    Time 6    Period Weeks    Status On-going      PT LONG TERM GOAL #5   Title Independent in HEP (including aquatic program as indicated)    Time 6    Period Weeks    Status On-going      PT LONG TERM GOAL #7   Title Improve functional limitation score to 59    Time 6    Period Weeks    Status On-going                   Plan - 09/22/20 1739     Clinical Impression Statement Pt reported reduction of neck/shoulder tightness with exercises (including NuStep and doorway stretches).  Some increased irritation of Lt lateral/posterior shoulder with quadruped position; reduced with application of TENS at end of session. Encouraged pt to be more mindful of posture at work/when on phone to avoid further irritation of neck/ shoulder. pt gradually progressing towards goals of therapy.    Rehab Potential Good    PT Frequency 2x / week    PT Duration 6 weeks    PT Treatment/Interventions ADLs/Self Care Home Management;Aquatic Therapy;Cryotherapy;Electrical Stimulation;Iontophoresis '4mg'$ /ml Dexamethasone;Moist Heat;Ultrasound;Therapeutic activities;Therapeutic exercise;Neuromuscular re-education;Patient/family education;Manual techniques;Dry needling    PT Next Visit Plan continue with postural correction working on thoracic extension; manual work  cervical and thoracic spine (does not like DN historically); modalities as indicated (has TENs unit at home).    PT Home Exercise Plan [id number]    Consulted and Agree with Plan of Care Patient             Patient will benefit from skilled therapeutic intervention in order to improve the following deficits and impairments:  Decreased activity tolerance, Pain, Decreased range of motion, Hypermobility, Impaired flexibility, Postural dysfunction  Visit  Diagnosis: Cervical  dysfunction  Other symptoms and signs involving the musculoskeletal system  Abnormal posture     Problem List Patient Active Problem List   Diagnosis Date Noted   Disc disease, degenerative, cervical 09/07/2020   Wheezing 09/06/2020   Chest tightness 09/06/2020   Cough 09/06/2020   Tinnitus of both ears 08/29/2020   CKD (chronic kidney disease), 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   Gastroesophageal reflux disease 03/29/2020   Epigastric discomfort 03/29/2020   Acute pain of left shoulder 03/22/2020   Elevated LDL cholesterol level 03/19/2019   Chronic pain of right knee 12/16/2018   Change in stool caliber 02/19/2017   History of shingles 12/10/2016   Environmental allergies 12/10/2016   Class 2 obesity due to excess calories without serious comorbidity with body mass index (BMI) of 36.0 to 36.9 in adult 12/10/2016   Vitamin D insufficiency 12/10/2016   Bilateral chronic knee pain 07/12/2015   Elevated platelet count 07/12/2015   H/O colonoscopy 03/29/2015   Systemic lupus erythematosus ([place]) 03/21/2015   Sickle cell trait ([place]) 03/21/2015   History of non anemic vitamin B12 deficiency 03/21/2015   Chest discomfort 03/21/2015   Chronic fatigue 03/21/2015   Rupture of quadriceps tendon, sequela 07/05/2014    Kerin Perna, PTA 09/22/20 5:50 PM   [facility] [place] [address] [place], [place], [address] Phone: [phone]   Fax:  [phone]  Name: Pam Gomez MRN: [id number] Date of Birth: 09/26/1965

## 2020-09-22 NOTE — Patient Instructions (Signed)

## 2020-09-26 ENCOUNTER — Encounter: Admitting: Rehabilitative and Restorative Service Providers"

## 2020-09-29 ENCOUNTER — Encounter: Admitting: Rehabilitative and Restorative Service Providers"

## 2020-10-13 ENCOUNTER — Encounter: Payer: Self-pay | Admitting: Rehabilitative and Restorative Service Providers"

## 2020-10-13 ENCOUNTER — Ambulatory Visit (INDEPENDENT_AMBULATORY_CARE_PROVIDER_SITE_OTHER): Admitting: Rehabilitative and Restorative Service Providers"

## 2020-10-13 DIAGNOSIS — R293 Abnormal posture: Secondary | ICD-10-CM | POA: Diagnosis not present

## 2020-10-13 DIAGNOSIS — M539 Dorsopathy, unspecified: Secondary | ICD-10-CM

## 2020-10-13 DIAGNOSIS — R29898 Other symptoms and signs involving the musculoskeletal system: Secondary | ICD-10-CM

## 2020-10-13 NOTE — Patient Instructions (Signed)
Access Code: [email or web address] URL: [email or web address] Date: 10/13/2020 Prepared by: Gillermo Murdoch  Exercises Seated Cervical Retraction - 3 x daily - 7 x weekly - 10 reps - 1 sets Standing Scapular Retraction - 3 x daily - 7 x weekly - 10 reps - 1 sets - 10 hold Shoulder External Rotation and Scapular Retraction - 3 x daily - 7 x weekly - 1 sets - 10 reps - 2-3 hold Doorway Pec Stretch at 60 Degrees Abduction - 3 x daily - 7 x weekly - 3 reps - 1 sets Doorway Pec Stretch at 90 Degrees Abduction - 3 x daily - 7 x weekly - 3 reps - 1 sets - 30 seconds hold Doorway Pec Stretch at 120 Degrees Abduction - 3 x daily - 7 x weekly - 3 reps - 1 sets - 30 second hold hold Standing Backward Shoulder Rolls - 2 x daily - 7 x weekly - 1 sets - 10 reps - 1-2 sec hold Shoulder External Rotation and Scapular Retraction with Resistance - 2 x daily - 7 x weekly - 3 sets - 10 reps - 3 sec hold Scapular Retraction with Resistance - 2 x daily - 7 x weekly - 3 sets - 10 reps - 3 sec hold Scapular Retraction with Resistance Advanced - 2 x daily - 7 x weekly - 3 sets - 10 reps - 3 sec hold Drawing Bow - 1 x daily - 7 x weekly - 1 sets - 10 reps - 3 sec hold Shoulder External Rotation with Anchored Resistance - 2 x daily - 7 x weekly - 1-2 sets - 10 reps - 3 sec hold Standing Shoulder Flexion Stretch on Wall - 2 x daily - 7 x weekly - 2 sets - 10 reps - 3 sec hold

## 2020-10-13 NOTE — Therapy (Signed)
[place], [place], [address] Phone: [phone]   Fax:  [phone]  Physical Therapy Treatment  Patient Details  Name: Pam Gomez MRN: [id number] Date of Birth: April 24, 1965 Referring Provider (PT): Iran Planas, [place]   Encounter Date: 10/13/2020   PT End of Session - 10/13/20 1454     Visit Number 7    Number of Visits 22    Date for PT Re-Evaluation 11/24/20    PT Start Time 1450   5 min late for appt   PT Stop Time 1538    PT Time Calculation (min) 48 min    Activity Tolerance Patient tolerated treatment well             Past Medical History:  Diagnosis Date   Sickle cell trait ([place]) 03/21/2015   Systemic lupus erythematosus ([place]) 03/21/2015    Past Surgical History:  Procedure Laterality Date   CESAREAN SECTION  2002   QUADRICEPS TENDON REPAIR  07/06/2000    There were no vitals filed for this visit.   Subjective Assessment - 10/13/20 1455     Subjective Patient reports that her neck is doing better. She has some continued tightness in the Lt arm but she has been working it.    Currently in Pain? Yes    Pain Score 6     Pain Location Neck    Pain Orientation Left    Pain Descriptors / Indicators Sharp;Dull    Pain Onset More than a month ago    Pain Frequency Intermittent    Aggravating Factors  stress; working at computer    Pain Relieving Factors stretching; TENS                [facility] PT Assessment - 10/13/20 0001       Assessment   Medical Diagnosis Cervical dysfunction    Referring Provider (PT) Iran Planas, PA-C    Onset Date/Surgical Date 07/18/20    Hand Dominance Right    Prior Therapy here for LE; back pain; neck pain  2018; 2019; 2021      Observation/Other Assessments   Focus on Therapeutic Outcomes (FOTO)  60      Sensation   Additional Comments WFL's per pt report      Posture/Postural Control   Posture Comments head forward; shoulders  rounded and elevated; increased thoracic kyphosis; scapulae abducted and rotated along the thoracic wall      AROM   Cervical Flexion 55 pulling    Cervical Extension 40 tight    Cervical - Right Side Bend 32 tight    Cervical - Left Side Bend 27 tight    Cervical - Right Rotation 60 slight tightness    Cervical - Left Rotation 62 sliht tightness      Strength   Overall Strength Comments WFL's bilat UE's      Palpation   Palpation comment muscular tightness thoracic and cervical paraspinals; ant/lat/post cervical musculature; upper traps; leveator; pecs Lt > Rt                           [facility] Adult PT Treatment/Exercise - 10/13/20 0001       Neck Exercises: Machines for Strengthening   UBE (Upper Arm Bike) L5 x 4 min - 2 min fwd/2 min back      Neck Exercises: Prone   Axial Exension 5 reps   5 sec  Shoulder Exercises: Standing   External Rotation Strengthening;Right;Left;10 reps;Theraband    Theraband Level (Shoulder External Rotation) Level 3 (Green)    Flexion AROM;Strengthening;Both;10 reps    Flexion Limitations V facing the wall    Extension Strengthening;Both;10 reps;Theraband    Theraband Level (Shoulder Extension) Level 3 (Green)    Row Strengthening;Both;10 reps;Theraband    Theraband Level (Shoulder Row) Level 3 (Green)    Row Limitations bow and arrow green TB x 10 each side    Retraction Strengthening;Both;10 reps;Theraband    Theraband Level (Shoulder Retraction) Level 2 (Red)      Shoulder Exercises: Stretch   Other Shoulder Stretches doorway stretch 3 positions 15 sec hold x 2 reps,      Moist Heat Therapy   Number Minutes Moist Heat 10 Minutes    Moist Heat Location Cervical   thoracic     Electrical Stimulation   Electrical Stimulation Location Lt cervical and shoulder area    Electrical Stimulation Action TENS    Electrical Stimulation Parameters to tolerance    Electrical Stimulation Goals Pain;Tone      Manual Therapy   Soft  tissue mobilization Lt > Rt ant/lat/posterior cervical musculatue; pecs; upper traps    Myofascial Release Lt upper trap; suboccipital release.    Manual Traction 20-30 sec x 3 reps                     PT Education - 10/13/20 1533     Education Details HEP    Person(s) Educated Patient    Methods Explanation;Demonstration;Tactile cues;Verbal cues;Handout    Comprehension Verbalized understanding;Returned demonstration;Verbal cues required;Tactile cues required                 PT Long Term Goals - 10/13/20 1517       PT LONG TERM GOAL #1   Title Improve posture and alignment with patient to demonstrate improved upright posture with posterior shoulder girdle engaged    Time 6    Period Weeks    Status On-going    Target Date 11/24/20      PT LONG TERM GOAL #2   Title Full pain free cervical ROM/mobility    Time 6    Period Weeks    Status On-going    Target Date 11/24/20      PT LONG TERM GOAL #3   Title Patient to demonstrate/verbalize proper sitting posture and ergonomics for desk and computer work    Time 6    Period Weeks    Status On-going    Target Date 11/24/20      PT LONG TERM GOAL #4   Title Patient reports return to normal functional activities with minimal to no pain (pain </= 0-2/10 on 0 to 10 scale)    Time 6    Period Weeks    Status On-going    Target Date 11/24/20      PT LONG TERM GOAL #5   Title Independent in HEP (including aquatic program as indicated)    Time 6    Period Weeks    Status On-going    Target Date 11/24/20      PT LONG TERM GOAL #7   Title Improve functional limitation score to 59    Time 6    Period Weeks    Status Achieved                   Plan - 10/13/20 1513     Clinical Impression  Statement Patient reports continued neck and shoulder pain. She has persistent muscular tightness and limited cervical ROM/mobility. Patient has accomplished part of rehab goals. She will benefit from continued  therapy to achieve maximum rehab potential.    Rehab Potential Good    PT Frequency 2x / week    PT Duration 6 weeks    PT Treatment/Interventions ADLs/Self Care Home Management;Aquatic Therapy;Cryotherapy;Electrical Stimulation;Iontophoresis '4mg'$ /ml Dexamethasone;Moist Heat;Ultrasound;Therapeutic activities;Therapeutic exercise;Neuromuscular re-education;Patient/family education;Manual techniques;Dry needling    PT Next Visit Plan continue with postural correction working on thoracic extension; manual work  cervical and thoracic spine (does not like DN historically); modalities as indicated (has TENs unit at home).    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   Gastroesophageal reflux disease 03/29/2020   Epigastric discomfort 03/29/2020   Acute pain of left shoulder 03/22/2020   Elevated LDL cholesterol level 03/19/2019   Chronic pain of right knee 12/16/2018   Change in stool caliber 02/19/2017   History of shingles 12/10/2016   Environmental allergies 12/10/2016   Class 2 obesity due to excess calories without serious comorbidity with body mass index (BMI) of 36.0 to 36.9 in adult 12/10/2016   Vitamin D insufficiency 12/10/2016   Bilateral chronic knee pain 07/12/2015   Elevated platelet count 07/12/2015   H/O colonoscopy 03/29/2015   Systemic lupus erythematosus ([place]) 03/21/2015   Sickle cell trait ([place])  03/21/2015   History of non anemic vitamin B12 deficiency 03/21/2015   Chest discomfort 03/21/2015   Chronic fatigue 03/21/2015   Rupture of quadriceps tendon, sequela 07/05/2014    Rece Zechman Nilda Simmer, PT, MPH  10/13/2020, 3:34 PM  [facility] [place], [place], [address] Phone: [phone]   Fax:  [phone]  Name: KIMONI HARTH MRN: [id number] Date of Birth: 1965-04-19

## 2020-10-14 ENCOUNTER — Ambulatory Visit (INDEPENDENT_AMBULATORY_CARE_PROVIDER_SITE_OTHER): Admitting: Physical Therapy

## 2020-10-14 ENCOUNTER — Other Ambulatory Visit: Payer: Self-pay

## 2020-10-14 DIAGNOSIS — M539 Dorsopathy, unspecified: Secondary | ICD-10-CM | POA: Diagnosis not present

## 2020-10-14 DIAGNOSIS — R29898 Other symptoms and signs involving the musculoskeletal system: Secondary | ICD-10-CM

## 2020-10-14 DIAGNOSIS — R293 Abnormal posture: Secondary | ICD-10-CM | POA: Diagnosis not present

## 2020-10-14 NOTE — Therapy (Signed)
[place], [place], [address] Phone: [phone]   Fax:  [phone]  Physical Therapy Treatment  Patient Details  Name: Pam Gomez MRN: [id number] Date of Birth: 01-21-1966 Referring Provider (PT): Iran Planas, [place]   Encounter Date: 10/14/2020   PT End of Session - 10/14/20 1525     Visit Number 8    Number of Visits 22    Date for PT Re-Evaluation 11/24/20    PT Start Time [id number]    PT Stop Time 1530    PT Time Calculation (min) 45 min    Activity Tolerance Patient tolerated treatment well    Behavior During Therapy [facility] for tasks assessed/performed             Past Medical History:  Diagnosis Date   Sickle cell trait ([place]) 03/21/2015   Systemic lupus erythematosus ([place]) 03/21/2015    Past Surgical History:  Procedure Laterality Date   CESAREAN SECTION  2002   QUADRICEPS TENDON REPAIR  07/06/2000    There were no vitals filed for this visit.   Subjective Assessment - 10/14/20 1451     Subjective Pt states she feels "looser" today    Patient Stated Goals get rid of the pain    Pain Score 3     Pain Location Back    Pain Orientation Lower;Mid    Pain Descriptors / Indicators Sore                [facility] PT Assessment - 10/14/20 0001       Assessment   Medical Diagnosis Cervical dysfunction    Referring Provider (PT) Iran Planas, PA-C    Onset Date/Surgical Date 07/18/20    Hand Dominance Right    Prior Therapy here for LE; back pain; neck pain  2018; 2019; 2021                           [facility] Adult PT Treatment/Exercise - 10/14/20 0001       Neck Exercises: Machines for Strengthening   Nustep L5 x 5 mins for warm up      Neck Exercises: Prone   Axial Exension 5 reps   5 sec     Shoulder Exercises: Stretch   Other Shoulder Stretches doorway stretch 3 positions 15 sec hold x 2 reps,      Moist Heat Therapy   Number Minutes Moist Heat 10  Minutes    Moist Heat Location Cervical      Electrical Stimulation   Electrical Stimulation Location Lt cervical and shoulder area    Electrical Stimulation Action TENS    Electrical Stimulation Parameters to tolerance    Electrical Stimulation Goals Pain      Manual Therapy   Soft tissue mobilization Lt > Rt ant/lat/posterior cervical musculatue; pecs; upper traps    Myofascial Release suboccipital release    Manual Traction 20-30 sec x 3 reps                          PT Long Term Goals - 10/13/20 1517       PT LONG TERM GOAL #1   Title Improve posture and alignment with patient to demonstrate improved upright posture with posterior shoulder girdle engaged    Time 6    Period Weeks    Status On-going    Target Date 11/24/20  PT LONG TERM GOAL #2   Title Full pain free cervical ROM/mobility    Time 6    Period Weeks    Status On-going    Target Date 11/24/20      PT LONG TERM GOAL #3   Title Patient to demonstrate/verbalize proper sitting posture and ergonomics for desk and computer work    Time 6    Period Weeks    Status On-going    Target Date 11/24/20      PT LONG TERM GOAL #4   Title Patient reports return to normal functional activities with minimal to no pain (pain </= 0-2/10 on 0 to 10 scale)    Time 6    Period Weeks    Status On-going    Target Date 11/24/20      PT LONG TERM GOAL #5   Title Independent in HEP (including aquatic program as indicated)    Time 6    Period Weeks    Status On-going    Target Date 11/24/20      PT LONG TERM GOAL #7   Title Improve functional limitation score to 59    Time 6    Period Weeks    Status Achieved                   Plan - 10/14/20 1525     Clinical Impression Statement Session focued on manual work to upper traps and cervical musculature as pt is sore from PT yesterday. Pt with continued mm spasticity bilat upper traps which responds well to manual work    PT Next Visit  Plan postural correction, manual work as indicated    PT Home Exercise Plan [id number]    Consulted and Agree with Plan of Care Patient             Patient will benefit from skilled therapeutic intervention in order to improve the following deficits and impairments:     Visit Diagnosis: Cervical dysfunction  Other symptoms and signs involving the musculoskeletal system  Abnormal posture     Problem List Patient Active Problem List   Diagnosis Date Noted   Disc disease, degenerative, cervical 09/07/2020   Wheezing 09/06/2020   Chest tightness 09/06/2020   Cough 09/06/2020   Tinnitus of both ears 08/29/2020   CKD (chronic kidney disease), 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   Gastroesophageal reflux disease 03/29/2020   Epigastric discomfort 03/29/2020   Acute pain of left shoulder 03/22/2020   Elevated LDL cholesterol level 03/19/2019   Chronic pain of right knee 12/16/2018   Change in stool caliber 02/19/2017   History of shingles 12/10/2016   Environmental allergies 12/10/2016   Class 2 obesity due to excess calories without serious comorbidity with body mass index (BMI) of 36.0 to 36.9 in adult 12/10/2016   Vitamin D insufficiency 12/10/2016   Bilateral chronic knee pain 07/12/2015   Elevated platelet count 07/12/2015   H/O colonoscopy 03/29/2015   Systemic lupus erythematosus ([place]) 03/21/2015   Sickle cell trait ([place]) 03/21/2015   History of non anemic vitamin B12 deficiency 03/21/2015   Chest discomfort 03/21/2015   Chronic fatigue 03/21/2015   Rupture of quadriceps tendon, sequela 07/05/2014    Mikal Wisman, PT 10/14/2020, 3:28 PM  [facility] [place], [place], [address] Phone: [phone]   Fax:  [phone]  Name: Pam Gomez MRN: [id number] Date of Birth: 05/05/1965

## 2020-10-18 ENCOUNTER — Ambulatory Visit (INDEPENDENT_AMBULATORY_CARE_PROVIDER_SITE_OTHER): Admitting: Physical Therapy

## 2020-10-18 ENCOUNTER — Other Ambulatory Visit: Payer: Self-pay

## 2020-10-18 DIAGNOSIS — M539 Dorsopathy, unspecified: Secondary | ICD-10-CM | POA: Diagnosis not present

## 2020-10-18 DIAGNOSIS — R293 Abnormal posture: Secondary | ICD-10-CM | POA: Diagnosis not present

## 2020-10-18 DIAGNOSIS — R29898 Other symptoms and signs involving the musculoskeletal system: Secondary | ICD-10-CM

## 2020-10-18 NOTE — Therapy (Signed)
[place], [place], [address] Phone: [phone]   Fax:  [phone]  Physical Therapy Treatment  Patient Details  Name: Pam Gomez MRN: [id number] Date of Birth: Aug 12, 1965 Referring Provider (PT): Iran Planas, [place]   Encounter Date: 10/18/2020   PT End of Session - 10/18/20 1235     Visit Number 9    Number of Visits 22    Date for PT Re-Evaluation 11/24/20    PT Start Time 3428    PT Stop Time 1239   MHP last 10 min   PT Time Calculation (min) 51 min    Activity Tolerance Patient tolerated treatment well    Behavior During Therapy [facility] for tasks assessed/performed             Past Medical History:  Diagnosis Date   Sickle cell trait ([place]) 03/21/2015   Systemic lupus erythematosus ([place]) 03/21/2015    Past Surgical History:  Procedure Laterality Date   CESAREAN SECTION  2002   QUADRICEPS TENDON REPAIR  07/06/2000    There were no vitals filed for this visit.   Subjective Assessment - 10/18/20 1152     Subjective Pt reports 50% improvement in neck pain since starting therapy. " Stress level plays a role".  She has been using massager to loosen her neck and shoulder.    Pertinent History Rt knee patellar tendon rupture and repair; LBP; arthritis    Patient Stated Goals get rid of the pain    Currently in Pain? Yes    Pain Score 3     Pain Location Neck    Pain Orientation Lower;Upper;Right;Left    Pain Descriptors / Indicators Sore    Aggravating Factors  stress; working at computer    Pain Relieving Factors stretching, massage, TENS                [facility] PT Assessment - 10/18/20 0001       Assessment   Medical Diagnosis Cervical dysfunction    Referring Provider (PT) Iran Planas, PA-C    Onset Date/Surgical Date 07/18/20    Hand Dominance Right    Prior Therapy here for LE; back pain; neck pain  2018; 2019; 2021      AROM   Cervical Flexion 60    Cervical  Extension 50    Cervical - Right Side Bend 42    Cervical - Left Side Bend 39    Cervical - Right Rotation 45   pain on Lt.   Cervical - Left Rotation 62              [facility] Adult PT Treatment/Exercise - 10/18/20 0001       Neck Exercises: Machines for Strengthening   Nustep L3: 1.5 min each direction      Neck Exercises: Seated   Lateral Flexion Right;Left;5 reps      Neck Exercises: Supine   Cervical Rotation Right;Left;5 reps   arms in T, with lumbar rotation to opp side.     Neck Exercises: Sidelying   Other Sidelying Exercise open book Rt rotation x 5, limited Lt rotation due to pain in Lt shoulder stopped after 2.      Neck Exercises: Prone   Axial Exension 10 reps    W Back 10 reps    Shoulder Extension 10 reps    Other Prone Exercise forearm plank x 30 sec x 2, cues for form.      Shoulder Exercises: Standing  Row Strengthening;Right;Left;10 reps   bow and arrow, with step back     Shoulder Exercises: Stretch   Other Shoulder Stretches doorway stretch 3 positions 15 sec hold x 2 reps,      Moist Heat Therapy   Number Minutes Moist Heat 10 Minutes    Moist Heat Location Cervical      Manual Therapy   Soft tissue mobilization Lt/Rt cervical paraspinals and upper traps    Myofascial Release suboccipital release    Manual Traction 20-30 sec x 3 reps               PT Long Term Goals - 10/18/20 1157       PT LONG TERM GOAL #1   Title Improve posture and alignment with patient to demonstrate improved upright posture with posterior shoulder girdle engaged    Time 6    Period Weeks    Status On-going      PT LONG TERM GOAL #2   Title Full pain free cervical ROM/mobility    Time 6    Period Weeks    Status Partially Met      PT LONG TERM GOAL #3   Title Patient to demonstrate/verbalize proper sitting posture and ergonomics for desk and computer work    Time 6    Period Weeks    Status On-going      PT LONG TERM GOAL #4   Title Patient reports  return to normal functional activities with minimal to no pain (pain </= 0-2/10 on 0 to 10 scale)    Baseline back to normal activities; pain of 3-4/10    Time 6    Period Weeks    Status Partially Met      PT LONG TERM GOAL #5   Title Independent in HEP (including aquatic program as indicated)    Time 6    Period Weeks    Status On-going      PT LONG TERM GOAL #7   Title Improve functional limitation score to 59    Time 6    Period Weeks    Status Achieved                   Plan - 10/18/20 1236     Clinical Impression Statement Good improvement in cervical ROM; has partially met her ROM goal.  Her Lt shoulder appears irritated today; limited some of the exercises she could perform with LUE. Some palpable tightness in Lt cervical suboccipital and Rt upper trap with manual therapy.  Making good gains towards LTGs.    Rehab Potential Good    PT Frequency 2x / week    PT Duration 6 weeks    PT Treatment/Interventions ADLs/Self Care Home Management;Aquatic Therapy;Cryotherapy;Electrical Stimulation;Iontophoresis 4mg/ml Dexamethasone;Moist Heat;Ultrasound;Therapeutic activities;Therapeutic exercise;Neuromuscular re-education;Patient/family education;Manual techniques;Dry needling    PT Next Visit Plan postural correction, manual work as indicated    PT Home Exercise Plan [id number]    Consulted and Agree with Plan of Care Patient             Patient will benefit from skilled therapeutic intervention in order to improve the following deficits and impairments:  Decreased activity tolerance, Pain, Decreased range of motion, Hypermobility, Impaired flexibility, Postural dysfunction  Visit Diagnosis: Cervical dysfunction  Other symptoms and signs involving the musculoskeletal system  Abnormal posture     Problem List Patient Active Problem List   Diagnosis Date Noted   Disc disease, degenerative, cervical 09/07/2020   Wheezing 09/06/2020     Chest tightness 09/06/2020    Cough 09/06/2020   Tinnitus of both ears 08/29/2020   CKD (chronic kidney disease), 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   Gastroesophageal reflux disease 03/29/2020   Epigastric discomfort 03/29/2020   Acute pain of left shoulder 03/22/2020   Elevated LDL cholesterol level 03/19/2019   Chronic pain of right knee 12/16/2018   Change in stool caliber 02/19/2017   History of shingles 12/10/2016   Environmental allergies 12/10/2016   Class 2 obesity due to excess calories without serious comorbidity with body mass index (BMI) of 36.0 to 36.9 in adult 12/10/2016   Vitamin D insufficiency 12/10/2016   Bilateral chronic knee pain 07/12/2015   Elevated platelet count 07/12/2015   H/O colonoscopy 03/29/2015   Systemic lupus erythematosus (H[facility]) 03/21/2015   Sickle cell trait (HCC) 03/21/2015   History of non anemic vitamin B12 deficiency 03/21/2015   Chest discomfort 03/21/2015   Chronic fatigue 03/21/2015   Rupture of quadriceps tendon, sequela 07/05/2014   Jennifer Carlson-Long, PTA 10/18/20 12:50 PM  [facility] [address] [place], [place], [address] Phone: [phone]   Fax:  [phone]  Name: Pam Gomez MRN: [id number] Date of Birth: 08/31/1965    

## 2020-10-20 ENCOUNTER — Telehealth: Payer: Self-pay

## 2020-10-20 NOTE — Telephone Encounter (Signed)
Medication: Semaglutide-Weight Management (WEGOVY) 0.25 MG/0.5ML SOAJ Prior authorization submitted via CoverMyMeds on 10/20/2020 PA submission pending

## 2020-10-26 NOTE — Telephone Encounter (Signed)
This encounter was created in error - please disregard.

## 2020-10-27 ENCOUNTER — Encounter: Admitting: Physical Therapy

## 2020-11-03 ENCOUNTER — Telehealth: Payer: Self-pay | Admitting: Physical Therapy

## 2020-11-03 ENCOUNTER — Encounter: Admitting: Physical Therapy

## 2020-11-03 NOTE — Telephone Encounter (Signed)
Patient did not show for PT appointment today.  Called and left HIPAA compliant voicemail for her requesting she call back regarding missed appointment and to schedule further visits.  [phone] Kerin Perna, PTA 11/03/20 4:43 PM

## 2020-11-09 NOTE — Telephone Encounter (Signed)
Medication: Semaglutide-Weight Management (WEGOVY) 0.25 MG/0.5ML SOAJ Prior authorization determination received Medication has been denied Reason for denial:  "Coverage is provided in situations where the patient is greater than or equal to 56 years of age and has tried and failed or has a contraindication to ALL of the following agents: generic phentermine, Qsymia, Xenical, and Contrave"

## 2020-11-16 ENCOUNTER — Encounter: Payer: Self-pay | Admitting: Rehabilitative and Restorative Service Providers"

## 2020-11-16 ENCOUNTER — Ambulatory Visit (INDEPENDENT_AMBULATORY_CARE_PROVIDER_SITE_OTHER): Admitting: Rehabilitative and Restorative Service Providers"

## 2020-11-16 ENCOUNTER — Other Ambulatory Visit: Payer: Self-pay

## 2020-11-16 DIAGNOSIS — M539 Dorsopathy, unspecified: Secondary | ICD-10-CM | POA: Diagnosis not present

## 2020-11-16 DIAGNOSIS — R293 Abnormal posture: Secondary | ICD-10-CM | POA: Diagnosis not present

## 2020-11-16 DIAGNOSIS — R29898 Other symptoms and signs involving the musculoskeletal system: Secondary | ICD-10-CM

## 2020-11-16 NOTE — Therapy (Signed)
[place], [place], [address] Phone: [phone]   Fax:  [phone]  Physical Therapy Treatment  Patient Details  Name: Pam Gomez MRN: [id number] Date of Birth: [birth year] Referring Provider (PT): Iran Planas, [place]   Encounter Date: 11/16/2020   PT End of Session - 11/16/20 1605     Visit Number 10    Number of Visits 22    Date for PT Re-Evaluation 11/24/20    PT Start Time 6468    PT Stop Time 1652    PT Time Calculation (min) 49 min    Activity Tolerance Patient tolerated treatment well             Past Medical History:  Diagnosis Date   Sickle cell trait ([place]) 03/21/2015   Systemic lupus erythematosus ([place]) 03/21/2015    Past Surgical History:  Procedure Laterality Date   CESAREAN SECTION  2002   QUADRICEPS TENDON REPAIR  07/06/2000    There were no vitals filed for this visit.   Subjective Assessment - 11/16/20 1606     Subjective Patient reports that she has been helping with her mom who has cancer and has been hospitalized and in rehab. She is having less pain in the neck. She has continued tightness in the Lt shoulder and upper trap. She has been working on exercises as possible with caring for mom.    Pertinent History Rt knee patellar tendon rupture and repair; LBP; arthritis    Currently in Pain? Yes    Pain Location Neck    Pain Orientation Left;Right;Mid    Pain Descriptors / Indicators Tightness;Sore    Pain Type Acute pain;Chronic pain    Pain Radiating Towards into to upper trap and shoulder    Pain Onset More than a month ago    Pain Frequency Intermittent    Aggravating Factors  stress    Pain Relieving Factors stretching; massage; TENS                [facility] PT Assessment - 11/16/20 0001       Assessment   Medical Diagnosis Cervical dysfunction    Referring Provider (PT) Iran Planas, PA-C    Onset Date/Surgical Date 07/18/20    Hand  Dominance Right    Prior Therapy here for LE; back pain; neck pain  2018; 2019; 2021      Observation/Other Assessments   Focus on Therapeutic Outcomes (FOTO)  57      Sensation   Additional Comments WFL's per pt report      AROM   Overall AROM Comments tight all cervical ROM not painful    Cervical Flexion 45    Cervical Extension 45    Cervical - Right Side Bend 28    Cervical - Left Side Bend 30    Cervical - Right Rotation 60    Cervical - Left Rotation 62      Strength   Overall Strength Comments WFL's bilat UE's      Palpation   Spinal mobility hypomobile cervical and upper thoracic spine with CPA and lateral mobs     Palpation comment muscular tightness thoracic and cervical paraspinals; ant/lat/post cervical musculature; upper traps; leveator; pecs Lt > Rt                           [facility] Adult PT Treatment/Exercise - 11/16/20 0001       Neck Exercises: Seated  Neck Retraction 5 reps;10 secs    Lateral Flexion Right;Left;5 reps   10 sec hold   W Back 20 reps    Shoulder Rolls Backwards;20 reps      Shoulder Exercises: Supine   Other Supine Exercises axial extension 10 x 5 reps x 2 sets; ; chest lift 10 sec x 5 reps; nodding yes x 10-20reps without lifting head from table      Shoulder Exercises: Standing   Extension Strengthening;Both;10 reps;Theraband    Theraband Level (Shoulder Extension) Level 3 (Green)    Row Strengthening;Right;Left;10 reps;Theraband    Theraband Level (Shoulder Row) Level 3 (Green)    Row Limitations bow and arrow green TB x 10 each side    Retraction Strengthening;Both;10 reps;Theraband    Theraband Level (Shoulder Retraction) Level 2 (Red)      Shoulder Exercises: Stretch   Other Shoulder Stretches doorway stretch 3 positions 15 sec hold x 2 reps,      Moist Heat Therapy   Number Minutes Moist Heat 10 Minutes    Moist Heat Location Cervical      Manual Therapy   Soft tissue mobilization Lt/Rt cervical  paraspinals and upper traps    Myofascial Release suboccipital release    Manual Traction 20-30 sec x 3 reps                          PT Long Term Goals - 11/16/20 1621       PT LONG TERM GOAL #1   Title Improve posture and alignment with patient to demonstrate improved upright posture with posterior shoulder girdle engaged    Time 6    Period Weeks    Status On-going      PT LONG TERM GOAL #2   Title Full pain free cervical ROM/mobility    Time 6    Period Weeks    Status Partially Met      PT LONG TERM GOAL #3   Title Patient to demonstrate/verbalize proper sitting posture and ergonomics for desk and computer work    Time 6    Period Weeks    Status Achieved      PT LONG TERM GOAL #4   Title Patient reports return to normal functional activities with minimal to no pain (pain </= 0-2/10 on 0 to 10 scale)    Baseline back to normal activities; pain of 3-4/10    Time 6    Period Weeks    Status On-going      PT LONG TERM GOAL #5   Title Independent in HEP (including aquatic program as indicated)    Time 6    Period Weeks    Status On-going      PT LONG TERM GOAL #7   Title Improve functional limitation score to 59    Time 6    Status Achieved                   Plan - 11/16/20 1630     Clinical Impression Statement Patient reports continued pain and tightness in the Lt cervical spine on an intermittent basis. She has muscular tightness in the cervical and thoracic musculature and into the upper trap. She is working on exercises as she can fit it in to the day. 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 We will continue treatment next week and assessreadiness to discharge to HEP and gym  program.    Rehab Potential Good    PT Frequency 2x / week    PT Duration 6 weeks    PT Treatment/Interventions ADLs/Self Care Home Management;Aquatic Therapy;Cryotherapy;Electrical Stimulation;Iontophoresis  68m/ml Dexamethasone;Moist Heat;Ultrasound;Therapeutic activities;Therapeutic exercise;Neuromuscular re-education;Patient/family education;Manual techniques;Dry needling    PT Next Visit Plan postural correction, therapeutic exercise, stretching, manual work as indicated, has TENS unit at home  - reassess next week for continued treatment vs d/c to gym program and independent HEP    PT Home Exercise Plan [id number]   Consulted and Agree with Plan of Care Patient             Patient will benefit from skilled therapeutic intervention in order to improve the following deficits and impairments:     Visit Diagnosis: Cervical dysfunction  Other symptoms and signs involving the musculoskeletal system  Abnormal posture     Problem List Patient Active Problem List   Diagnosis Date Noted   Disc disease, degenerative, cervical 09/07/2020   Wheezing 09/06/2020   Chest tightness 09/06/2020   Cough 09/06/2020   Tinnitus of both ears 08/29/2020   CKD (chronic kidney disease), 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   Gastroesophageal reflux disease 03/29/2020   Epigastric discomfort 03/29/2020   Acute pain of left shoulder 03/22/2020   Elevated LDL cholesterol level 03/19/2019   Chronic pain of right knee 12/16/2018   Change in stool caliber 02/19/2017   History of shingles 12/10/2016   Environmental allergies 12/10/2016   Class 2 obesity due to excess calories without serious comorbidity with body mass index (BMI) of 36.0 to 36.9 in adult 12/10/2016   Vitamin D insufficiency 12/10/2016   Bilateral chronic knee pain 07/12/2015   Elevated platelet count 07/12/2015   H/O colonoscopy 03/29/2015   Systemic lupus erythematosus ([facility] [place] 03/21/2015   Sickle cell trait ([place] 03/21/2015   History of non anemic vitamin B12 deficiency 03/21/2015   Chest discomfort 03/21/2015   Chronic fatigue 03/21/2015   Rupture of quadriceps tendon, sequela 07/05/2014    Juelle Dickmann PNilda Simmer PT, MPH  11/16/2020, 4:49 PM  [facility] [address]Phone: [phone]  Fax:  [phone] Name: Pam RIVENBARKMRN: [id number] of Birth: 509-25-1967

## 2020-11-17 MED ORDER — NALTREXONE-BUPROPION HCL ER 8-90 MG PO TB12: ORAL_TABLET | ORAL | 0 refills | Status: DC

## 2020-11-17 MED ORDER — NALTREXONE-BUPROPION HCL ER 8-90 MG PO TB12: 2.0000 | ORAL_TABLET | Freq: Two times a day (BID) | ORAL | 2 refills | Status: DC

## 2020-11-17 NOTE — Telephone Encounter (Signed)
Spoke with pt and she would like to try taking Contrave.

## 2020-11-17 NOTE — Addendum Note (Signed)
Addended by: Donella Stade on: 11/17/2020 08:15 PM   Modules accepted: Orders

## 2020-11-24 ENCOUNTER — Other Ambulatory Visit: Payer: Self-pay

## 2020-11-24 ENCOUNTER — Encounter: Payer: Self-pay | Admitting: Rehabilitative and Restorative Service Providers"

## 2020-11-24 ENCOUNTER — Ambulatory Visit (INDEPENDENT_AMBULATORY_CARE_PROVIDER_SITE_OTHER): Admitting: Rehabilitative and Restorative Service Providers"

## 2020-11-24 DIAGNOSIS — R293 Abnormal posture: Secondary | ICD-10-CM

## 2020-11-24 DIAGNOSIS — R29898 Other symptoms and signs involving the musculoskeletal system: Secondary | ICD-10-CM

## 2020-11-24 DIAGNOSIS — M539 Dorsopathy, unspecified: Secondary | ICD-10-CM | POA: Diagnosis not present

## 2020-11-24 NOTE — Therapy (Addendum)
[place], [place], [address] Phone: [phone]   Fax:  [phone]  Physical Therapy Treatment  Patient Details  Name: Pam Gomez MRN: [id number] Date of Birth: 1965/02/09 Referring Provider (PT): Iran Planas, PA-C  PHYSICAL THERAPY DISCHARGE SUMMARY  Visits from Start of Care: 11  Current functional level related to goals / functional outcomes: See progress note for discharge status    Remaining deficits: Continued intermittent tightness and discomfort    Education / Equipment: HEP    Patient agrees to discharge. Patient goals were partially met. Patient is being discharged due to not returning since the last visit.  Corvin Sorbo P. Helene Kelp PT, MPH 12/27/20 10:47 AM   Encounter Date: 11/24/2020   PT End of Session - 11/24/20 1539     Visit Number 11    Number of Visits 22    Date for PT Re-Evaluation 11/24/20    PT Start Time 7616    PT Stop Time 1615   moist heat end of treatment   PT Time Calculation (min) 40 min    Activity Tolerance Patient tolerated treatment well             Past Medical History:  Diagnosis Date   Sickle cell trait ([place]) 03/21/2015   Systemic lupus erythematosus ([place]) 03/21/2015    Past Surgical History:  Procedure Laterality Date   CESAREAN SECTION  2002   QUADRICEPS TENDON REPAIR  07/06/2000    There were no vitals filed for this visit.   Subjective Assessment - 11/24/20 1540     Subjective Patient reports that he mom is now in hospice but remains in her home but she is not doing well. Jaleyah is having a hard day. Rt knee is swollen. Neck and shoulders are tight. Patient will continue with home program as she can with stressful situation with her mom.    Currently in Pain? Yes    Pain Location Neck    Pain Orientation Left;Right;Mid                [facility] PT Assessment - 11/24/20 0001       Assessment   Medical Diagnosis Cervical  dysfunction    Referring Provider (PT) Iran Planas, PA-C    Onset Date/Surgical Date 07/18/20    Hand Dominance Right    Prior Therapy here for LE; back pain; neck pain  2018; 2019; 2021      AROM   Overall AROM Comments tight all cervical ROM not painful    Cervical Flexion 45    Cervical Extension 46    Cervical - Right Side Bend 32    Cervical - Left Side Bend 34    Cervical - Right Rotation 60    Cervical - Left Rotation 62      Strength   Overall Strength Comments WFL's bilat UE's      Palpation   Spinal mobility hypomobile cervical and upper thoracic spine with CPA and lateral mobs     Palpation comment muscular tightness thoracic and cervical paraspinals; ant/lat/post cervical musculature; upper traps; leveator; pecs Lt > Rt                           [facility] Adult PT Treatment/Exercise - 11/24/20 0001       Neck Exercises: Seated   Neck Retraction 5 reps;10 secs    Lateral Flexion Right;Left;5 reps   10 sec hold  W Back 20 reps    Shoulder Rolls Backwards;20 reps      Neck Exercises: Supine   Neck Retraction 10 reps;5 secs      Shoulder Exercises: Supine   Other Supine Exercises axial extension 10 x 5 reps x 2 sets; ; chest lift 10 sec x 5 reps; nodding yes x 10-20reps without lifting head from table      Shoulder Exercises: Stretch   Other Shoulder Stretches doorway stretch 3 positions 15 sec hold x 2 reps,      Moist Heat Therapy   Number Minutes Moist Heat 10 Minutes    Moist Heat Location Cervical      Manual Therapy   Soft tissue mobilization deep tissue work through ant/lat/post cervical musculature; upper traps; pecs    Myofascial Release suboccipital release    Passive ROM cervical PROM into flexion; flexion with slight rotation; lateral flexion    Manual Traction 20-30 sec x 3 reps                          PT Long Term Goals - 11/24/20 1547       PT LONG TERM GOAL #1   Title Improve posture and alignment with  patient to demonstrate improved upright posture with posterior shoulder girdle engaged    Period Weeks    Status On-going      PT LONG TERM GOAL #2   Title Full pain free cervical ROM/mobility    Time 6    Period Weeks    Status Partially Met      PT LONG TERM GOAL #3   Title Patient to demonstrate/verbalize proper sitting posture and ergonomics for desk and computer work    Time 6    Period Weeks    Status Achieved      PT LONG TERM GOAL #4   Title Patient reports return to normal functional activities with minimal to no pain (pain </= 0-2/10 on 0 to 10 scale)    Baseline back to normal activities; pain of 3-4/10    Time 6    Period Weeks    Status On-going      PT LONG TERM GOAL #5   Title Independent in HEP (including aquatic program as indicated)    Time 6    Period Weeks    Status On-going      PT LONG TERM GOAL #7   Title Improve functional limitation score to 59    Time 6    Period Weeks    Status Achieved                   Plan - 11/24/20 1608     Clinical Impression Statement Patient has significant increased life stressors with caring for her mother who has cancer. She has little time to work on exercises and therapy at this time. Patient has accomplished part of her goals for therapy. She will hold PT at this time and continue with exercise; heat and TENS unit as she can.    PT Frequency 2x / week    PT Duration 6 weeks    PT Treatment/Interventions ADLs/Self Care Home Management;Aquatic Therapy;Cryotherapy;Electrical Stimulation;Iontophoresis 66m/ml Dexamethasone;Moist Heat;Ultrasound;Therapeutic activities;Therapeutic exercise;Neuromuscular re-education;Patient/family education;Manual techniques;Dry needling    PT Next Visit Plan Hold PT patient will continue with postural correction, therapeutic exercise, stretching, manual work as indicated, has TENS unit at home    PT HTempletonand  Agree with Plan of Care Patient              Patient will benefit from skilled therapeutic intervention in order to improve the following deficits and impairments:     Visit Diagnosis: Cervical dysfunction  Other symptoms and signs involving the musculoskeletal system  Abnormal posture     Problem List Patient Active Problem List   Diagnosis Date Noted   Disc disease, degenerative, cervical 09/07/2020   Wheezing 09/06/2020   Chest tightness 09/06/2020   Cough 09/06/2020   Tinnitus of both ears 08/29/2020   CKD (chronic kidney disease), 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   Gastroesophageal reflux disease 03/29/2020   Epigastric discomfort 03/29/2020   Acute pain of left shoulder 03/22/2020   Elevated LDL cholesterol level 03/19/2019   Chronic pain of right knee 12/16/2018   Change in stool caliber 02/19/2017   History of shingles 12/10/2016   Environmental allergies 12/10/2016   Class 2 obesity due to excess calories without serious comorbidity with body mass index (BMI) of 36.0 to 36.9 in adult 12/10/2016   Vitamin D insufficiency 12/10/2016   Bilateral chronic knee pain 07/12/2015   Elevated platelet count 07/12/2015   H/O colonoscopy 03/29/2015   Systemic lupus erythematosus ([place]) 03/21/2015   Sickle cell trait ([place]) 03/21/2015   History of non anemic vitamin B12 deficiency 03/21/2015   Chest discomfort 03/21/2015   Chronic fatigue 03/21/2015   Rupture of quadriceps tendon, sequela 07/05/2014    Everardo All, PT MPH 11/24/2020, 4:11 PM  [facility] [place], [place], [address] Phone: [phone]   Fax:  [phone]  Name: Pam Gomez MRN: [id number] Date of Birth: May 25, 1965

## 2021-01-06 ENCOUNTER — Other Ambulatory Visit: Payer: Self-pay | Admitting: Physician Assistant

## 2021-01-06 ENCOUNTER — Ambulatory Visit (INDEPENDENT_AMBULATORY_CARE_PROVIDER_SITE_OTHER): Admitting: Physician Assistant

## 2021-01-06 ENCOUNTER — Ambulatory Visit (INDEPENDENT_AMBULATORY_CARE_PROVIDER_SITE_OTHER)

## 2021-01-06 ENCOUNTER — Other Ambulatory Visit: Payer: Self-pay

## 2021-01-06 VITALS — BP 132/61 | HR 72 | Ht 63.0 in | Wt 195.0 lb

## 2021-01-06 DIAGNOSIS — G8929 Other chronic pain: Secondary | ICD-10-CM

## 2021-01-06 DIAGNOSIS — M539 Dorsopathy, unspecified: Secondary | ICD-10-CM | POA: Diagnosis not present

## 2021-01-06 DIAGNOSIS — Z1231 Encounter for screening mammogram for malignant neoplasm of breast: Secondary | ICD-10-CM

## 2021-01-06 DIAGNOSIS — M5030 Other cervical disc degeneration, unspecified cervical region: Secondary | ICD-10-CM

## 2021-01-06 DIAGNOSIS — M25561 Pain in right knee: Secondary | ICD-10-CM | POA: Diagnosis not present

## 2021-01-06 DIAGNOSIS — F4321 Adjustment disorder with depressed mood: Secondary | ICD-10-CM | POA: Diagnosis not present

## 2021-01-06 DIAGNOSIS — F4320 Adjustment disorder, unspecified: Secondary | ICD-10-CM

## 2021-01-06 HISTORY — DX: Adjustment disorder, unspecified: F43.20

## 2021-01-06 NOTE — Progress Notes (Addendum)
Subjective:     Patient ID: Pam Gomez, female   DOB: 04/02/1965, 55 y.o.   MRN: [id number]  HPI 55 y.o female with history of SLE, chronic fatigue syndrome, chronic right knee pain and chronic neck pain presenting for post care from PT. Pt states that she currently grieving the loss of her mother after her passing last month. States that it will be hard for her around the holidays since Christmas was her mother's favorite holiday. Although PT has greatly improved her neck pain about 50 percent, she states that she wants to hold off on therapy until she is in a better place mentally. Pt also states that she is having medial left knee pain that has been persisting for 3-4 months worse than her chronic right knee pain. States that she experiences stiffness in the knee in the morning and that she experiences dull pain in the knee that is worse with movement. She does have a hx of right quad tendon repair from rupture.   .. Active Ambulatory Problems    Diagnosis Date Noted   Systemic lupus erythematosus ([place]) 03/21/2015   Sickle cell trait ([place]) 03/21/2015   History of non anemic vitamin B12 deficiency 03/21/2015   Chest discomfort 03/21/2015   Chronic fatigue 03/21/2015   H/O colonoscopy 03/29/2015   Bilateral chronic knee pain 07/12/2015   Elevated platelet count 07/12/2015   History of shingles 12/10/2016   Environmental allergies 12/10/2016   Class 2 obesity due to excess calories without serious comorbidity with body mass index (BMI) of 36.0 to 36.9 in adult 12/10/2016   Vitamin D insufficiency 12/10/2016   Change in stool caliber 02/19/2017   Rupture of quadriceps tendon, sequela 07/05/2014   Chronic pain of right knee 12/16/2018   Elevated LDL cholesterol level 03/19/2019   Acute pain of left shoulder 03/22/2020   Gastroesophageal reflux disease 03/29/2020   Epigastric discomfort 03/29/2020   Tinnitus of both ears 08/29/2020   CKD (chronic kidney disease), 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  HENT:     Head: Normocephalic.  Musculoskeletal:     Comments: Tenderness noted near medial meniscus of left knee   Neurological:     Mental Status: She is alert and oriented to person, place, and time.   .. Depression screen [facility] 2/9 03/22/2020 03/18/2019 07/28/2018 02/24/2018 04/19/2017  Decreased Interest 0 0 0 0 0  Down, Depressed, Hopeless 0 0 0 0 0  PHQ - 2 Score 0 0 0 0 0  Altered sleeping 1 0 0 - -  Tired, decreased energy 1 0 0 - -  Change in appetite 0 1 0 - -  Feeling bad or failure about yourself  0 0 0 - -  Trouble concentrating 0 0 0 - -  Moving slowly or fidgety/restless 0 0 0 - -  Suicidal thoughts 0 0 0 - -  PHQ-9 Score 2 1 0 - -  Difficult doing work/chores Not difficult at all Not difficult at all Not difficult at all - -   .Marland Kitchen  GAD 7 : Generalized Anxiety Score 03/22/2020 03/18/2019 07/28/2018  Nervous, Anxious, on Edge 0 0 0  Control/stop worrying 0 0 0  Worry too much - different things 0 0 0  Trouble relaxing 0 0 1  Restless 0 0 0  Easily annoyed or irritable 0 0 0  Afraid - awful might happen 0 0 0  Total GAD 7 Score 0 0 1  Anxiety Difficulty Not difficult at all Not difficult at all Somewhat difficult        Assessment:    Diagnoses and all orders for this visit: Grief reaction Disc disease, degenerative, cervical Acute pain of right knee -     DG Knee Complete 4 Views Right; Future -     MR Knee Right Wo Contrast; Future Cervical dysfunction Chronic pain of right knee -     MR Knee Right Wo Contrast; Future      Plan:    Pt counseled on expectations of normal grief reaction.  Consider counseling. Declines any medication intervention at this time.  Xray ordered at today's visit. MRI ordered to assess for tissue abnormalities  Pt to continue physical therapy when ready she did get 50 percent improve ment.   Marland KitchenVernetta Honey PA-C, have reviewed and agree with the above documentation in it's entirety.

## 2021-01-06 NOTE — Patient Instructions (Addendum)
MRI of right knee  Managing Loss, Adult People experience loss in many different ways throughout their lives. Events such as moving, changing jobs, and losing friends can create a sense of loss. The loss may be as serious as a major health change, divorce, death of a pet, or death of a loved one. All of these types of loss are likely to create a physical and emotional reaction known as grief. Grief is the result of a major change or an absence of something or someone that you count on. Grief is a normal reaction to loss. A variety of factors can affect your grieving experience, including: The nature of your loss. Your relationship to what or whom you lost. Your understanding of grief and how to manage it. Your support system. Be aware that when grief becomes extreme, it can lead to more severe issues like isolation, depression, anxiety, or suicidal thoughts. Talk with your health care provider if you have any of these issues. How to manage lifestyle changes Keep to your normal routine as much as possible. If you have trouble focusing or doing normal activities, it is acceptable to take some time away from your normal routine. Spend time with friends and loved ones. Eat a healthy diet, get plenty of sleep, and rest when you feel tired. How to recognize changes  The way that you deal with your grief will affect your ability to function as you normally do. When grieving, you may experience these changes: Numbness, shock, sadness, anxiety, anger, denial, and guilt. Thoughts about death. Unexpected crying. A physical sensation of emptiness in your stomach. Problems sleeping and eating. Tiredness (fatigue). Loss of interest in normal activities. Dreaming about or imagining seeing the person who died. A need to remember what or whom you lost. Difficulty thinking about anything other than your loss for a period of time. Relief. If you have been expecting the loss for a while, you may feel a sense  of relief when it happens. Follow these instructions at home: Activity Express your feelings in healthy ways, such as: Talking with others about your loss. It may be helpful to find others who have had a similar loss, such as a support group. Writing down your feelings in a journal. Doing physical activities to release stress and emotional energy. Doing creative activities like painting, sculpting, or playing or listening to music. Practicing resilience. This is the ability to recover and adjust after facing challenges. Reading some resources that encourage resilience may help you to learn ways to practice those behaviors.  General instructions Be patient with yourself and others. Allow the grieving process to happen, and remember that grieving takes time. It is likely that you may never feel completely done with some grief. You may find a way to move on while still cherishing memories and feelings about your loss. Accepting your loss is a process. It can take months or longer to adjust. Keep all follow-up visits. This is important. Where to find support To get support for managing loss: Ask your health care provider for help and recommendations, such as grief counseling or therapy. Think about joining a support group for people who are managing a loss. Where to find more information You can find more information about managing loss from: American Society of Clinical Oncology: www.cancer.net American Psychological Association: TVStereos.ch Contact a health care provider if: Your grief is extreme and keeps getting worse. You have ongoing grief that does not improve. Your body shows symptoms of grief, such as illness.  You feel depressed, anxious, or hopeless. Get help right away if: You have thoughts about hurting yourself or others. Get help right away if you feel like you may hurt yourself or others, or have thoughts about taking your own life. Go to your nearest emergency room or: Call  911. Call the [place] at [phone] or 988. This is open 24 hours a day. Text the Crisis Text Line at (386)403-5954. Summary Grief is the result of a major change or an absence of someone or something that you count on. Grief is a normal reaction to loss. The depth of grief and the period of recovery depend on the type of loss and your ability to adjust to the change and process your feelings. Processing grief requires patience and a willingness to accept your feelings and talk about your loss with people who are supportive. It is important to find resources that work for you and to realize that people experience grief differently. There is not one grieving process that works for everyone in the same way. Be aware that when grief becomes extreme, it can lead to more severe issues like isolation, depression, anxiety, or suicidal thoughts. Talk with your health care provider if you have any of these issues. This information is not intended to replace advice given to you by your health care provider. Make sure you discuss any questions you have with your health care provider. Document Revised: 09/05/2020 Document Reviewed: 09/05/2020 Elsevier Patient Education  2022 [place].   Chronic Knee Pain, Adult Chronic knee pain is pain in one or both knees that lasts longer than 3 months. Symptoms of chronic knee pain may include swelling, stiffness, and discomfort. Age-related wear and tear (osteoarthritis) of the knee joint is the most common cause of chronic knee pain. Other possible causes include: A long-term immune-related disease that causes inflammation of the knee (rheumatoid arthritis). This usually affects both knees. Inflammatory arthritis, such as gout or pseudogout. An injury to the knee that causes arthritis. An injury to the knee that damages the ligaments. Ligaments are strong tissues that connect bones to each other. Runner's knee or pain behind the  kneecap. Treatment for chronic knee pain depends on the cause. The main treatments for chronic knee pain are physical therapy and weight loss. This condition may also be treated with medicines, injections, a knee sleeve or brace, and by using crutches. Rest, ice, pressure (compression), and elevation, also known as RICE therapy, may also be recommended. Follow these instructions at home: If you have a knee sleeve or brace:  Wear the knee sleeve or brace as told by your health care provider. Remove it only as told by your health care provider. Loosen it if your toes tingle, become numb, or turn cold and blue. Keep it clean. If the sleeve or brace is not waterproof: Do not let it get wet. Remove it if allowed by your health care provider, or cover it with a watertight covering when you take a bath or a shower. Managing pain, stiffness, and swelling   If directed, apply heat to the affected area as often as told by your health care provider. Use the heat source that your health care provider recommends, such as a moist heat pack or a heating pad. If you have a removable knee sleeve or brace, remove it as told by your health care provider. Place a towel between your skin and the heat source. Leave the heat on for 20-30 minutes. Remove the heat  if your skin turns bright red. This is especially important if you are unable to feel pain, heat, or cold. You may have a greater risk of getting burned. If directed, put ice on the affected area. To do this: If you have a removable knee sleeve or brace, remove it as told by your health care provider. Put ice in a plastic bag. Place a towel between your skin and the bag. Leave the ice on for 20 minutes, 2-3 times a day. Remove the ice if your skin turns bright red. This is very important. If you cannot feel pain, heat, or cold, you have a greater risk of damage to the area. Move your toes often to reduce stiffness and swelling. Raise (elevate) the injured  area above the level of your heart while you are sitting or lying down. Activity Avoid high-impact activities or exercises, such as running, jumping rope, or doing jumping jacks. Follow the exercise plan that your health care provider designed for you. Your health care provider may suggest that you: Avoid activities that make knee pain worse. This may require you to change your exercise routines, sport participation, or job duties. Wear shoes with cushioned soles. Avoid sports that require running and sudden changes in direction. Do physical therapy. Physical therapy is planned to match your needs and abilities. It may include exercises for strength, flexibility, stability, and endurance. Do exercises that increase balance and strength, such as tai chi and yoga. Do not use the injured limb to support your body weight until your health care provider says that you can. Use crutches as told by your health care provider. Return to your normal activities as told by your health care provider. Ask your health care provider what activities are safe for you. General instructions Take over-the-counter and prescription medicines only as told by your health care provider. Lose weight if you are overweight. Losing even a little weight can reduce knee pain. Ask your health care provider what your ideal weight is, and how to safely lose extra weight. A dietitian may be able to help you plan your meals. Do not use any products that contain nicotine or tobacco, such as cigarettes, e-cigarettes, and chewing tobacco. These can delay healing. If you need help quitting, ask your health care provider. Keep all follow-up visits. This is important. Contact a health care provider if: You have knee pain that is not getting better or gets worse. You are unable to do your physical therapy exercises due to knee pain. Get help right away if: Your knee swells and the swelling becomes worse. You cannot move your knee. You  have severe knee pain. Summary Knee pain that lasts more than 3 months is considered chronic knee pain. The main treatments for chronic knee pain are physical therapy and weight loss. You may also need to take medicines, wear a knee sleeve or brace, use crutches, and apply ice or heat. Losing even a little weight can reduce knee pain. Ask your health care provider what your ideal weight is, and how to safely lose extra weight. A dietitian may be able to help you plan your meals. Follow the exercise plan that your health care provider designed for you. This information is not intended to replace advice given to you by your health care provider. Make sure you discuss any questions you have with your health care provider. Document Revised: 07/01/2019 Document Reviewed: 07/01/2019 Elsevier Patient Education  2022 [employer].

## 2021-01-09 ENCOUNTER — Encounter: Payer: Self-pay | Admitting: Physician Assistant

## 2021-01-09 DIAGNOSIS — M539 Dorsopathy, unspecified: Secondary | ICD-10-CM

## 2021-01-09 DIAGNOSIS — M25561 Pain in right knee: Secondary | ICD-10-CM | POA: Insufficient documentation

## 2021-01-09 HISTORY — DX: Dorsopathy, unspecified: M53.9

## 2021-01-09 NOTE — Progress Notes (Signed)
No acute changes. There is some effusion(fluid) in the knee. You do have calcifications at insertion of quad tendon likely that is not new. I do want you to see Dr. Darene Lamer.

## 2021-01-15 ENCOUNTER — Ambulatory Visit (INDEPENDENT_AMBULATORY_CARE_PROVIDER_SITE_OTHER)

## 2021-01-15 ENCOUNTER — Other Ambulatory Visit: Payer: Self-pay

## 2021-01-15 DIAGNOSIS — M25561 Pain in right knee: Secondary | ICD-10-CM

## 2021-01-15 DIAGNOSIS — M25461 Effusion, right knee: Secondary | ICD-10-CM | POA: Diagnosis not present

## 2021-01-15 DIAGNOSIS — G8929 Other chronic pain: Secondary | ICD-10-CM | POA: Diagnosis not present

## 2021-01-16 NOTE — Progress Notes (Signed)
You have a meniscal tear in medial knee and lots of cartilage loss. You need to see ortho. Do you have a preference?

## 2021-01-17 ENCOUNTER — Other Ambulatory Visit: Payer: Self-pay

## 2021-01-17 ENCOUNTER — Ambulatory Visit (INDEPENDENT_AMBULATORY_CARE_PROVIDER_SITE_OTHER)

## 2021-01-17 ENCOUNTER — Ambulatory Visit (INDEPENDENT_AMBULATORY_CARE_PROVIDER_SITE_OTHER): Admitting: Sports Medicine

## 2021-01-17 DIAGNOSIS — M25561 Pain in right knee: Secondary | ICD-10-CM

## 2021-01-17 DIAGNOSIS — G8929 Other chronic pain: Secondary | ICD-10-CM

## 2021-01-17 NOTE — Assessment & Plan Note (Addendum)
This is a pleasant 55 year old female, long history of knee pain, more recently she was found to have a degenerative meniscal tear and osteoarthritis, Aleve at 1000 mg daily ineffective, today we injected her right knee, continue Aleve, adding physical therapy, return to see me in 4 to 6 weeks.

## 2021-01-17 NOTE — Progress Notes (Addendum)
° ° °  Procedures performed today:    Procedure: Real-time Ultrasound Guided injection of the right knee Device: Samsung HS60  Verbal informed consent obtained.  Time-out conducted.  Noted no overlying erythema, induration, or other signs of local infection.  Skin prepped in a sterile fashion.  Local anesthesia: Topical Ethyl chloride.  With sterile technique and under real time ultrasound guidance: Noted trace effusion, 1 cc Kenalog 40, 2 cc lidocaine, 2 cc bupivacaine injected easily Completed without difficulty  Advised to call if fevers/chills, erythema, induration, drainage, or persistent bleeding.  Images permanently stored and available for review in PACS.  Impression: Technically successful ultrasound guided injection.  Independent interpretation of notes and tests performed by another provider:   None.  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    ___________________________________________ Gwen Her. Dianah Field, M.D., ABFM., CAQSM. Primary Care and [place] Instructor of [place] of [facility] of Medicine

## 2021-02-01 ENCOUNTER — Ambulatory Visit: Admitting: Rehabilitative and Restorative Service Providers"

## 2021-02-06 ENCOUNTER — Ambulatory Visit: Attending: Sports Medicine | Admitting: Physical Therapy

## 2021-02-15 ENCOUNTER — Ambulatory Visit: Admitting: Sports Medicine

## 2021-02-21 ENCOUNTER — Ambulatory Visit: Admitting: Sports Medicine

## 2021-02-22 ENCOUNTER — Ambulatory Visit (INDEPENDENT_AMBULATORY_CARE_PROVIDER_SITE_OTHER): Admitting: Sports Medicine

## 2021-02-22 ENCOUNTER — Other Ambulatory Visit: Payer: Self-pay

## 2021-02-22 DIAGNOSIS — G8929 Other chronic pain: Secondary | ICD-10-CM

## 2021-02-22 DIAGNOSIS — M25561 Pain in right knee: Secondary | ICD-10-CM

## 2021-02-22 NOTE — Assessment & Plan Note (Signed)
Pam Gomez returns, she is a very pleasant 56 year old female, long history of knee pain, Aleve or the bowels milligrams daily ineffective, she was found on MRI to have a degenerative meniscal tear, osteoarthritis, she also has a history of quadriceps rupture. We injected her knee at the last visit and she returns today essentially pain-free, she still has a lot of questions, she also would like a second opinion from her surgeon, I will set her back up with Dr. Smitty Cords, but I did advise her #1 that the expectation should not be to be pain-free, but to have pain controlled enough where she is functional, and that we did not have a good cure for an arthritic knee with unhealthy cartilage but we would continue to manage it. She can return to see me as needed, she really does not have any mechanical symptoms so I do not know that an arthroscopy is indicated at this juncture, in the future if she has a recurrence of pain and a steroid injection is not helpful I would recommend we proceed with viscosupplementation.

## 2021-02-22 NOTE — Progress Notes (Signed)
° ° °  Procedures performed today:    None.  Independent interpretation of notes and tests performed by another provider:   None.  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 We injected her knee at the last visit and she returns today essentially pain-free, she still has a lot of questions, she also would like a second opinion from her surgeon, I will set her back up with Dr. Smitty Cords, but I did advise her #1 that the expectation should not be to be pain-free, but to have pain controlled enough where she is functional, and that we did not have a good cure for an arthritic knee with unhealthy cartilage but we would continue to manage it. She can return to see me as needed, she really does not have any mechanical symptoms so I do not know that an arthroscopy is indicated at this juncture, in the future if she has a recurrence of pain and a steroid injection is not helpful I would recommend we proceed with viscosupplementation.    ___________________________________________ Gwen Her. Dianah Field, M.D., ABFM., CAQSM. Primary Care and [place] Instructor of [place] of [facility] of Medicine

## 2021-03-27 ENCOUNTER — Other Ambulatory Visit: Payer: Self-pay | Admitting: Physician Assistant

## 2021-03-27 DIAGNOSIS — E7800 Pure hypercholesterolemia, unspecified: Secondary | ICD-10-CM

## 2021-04-12 ENCOUNTER — Ambulatory Visit

## 2021-04-13 ENCOUNTER — Ambulatory Visit

## 2021-05-11 ENCOUNTER — Ambulatory Visit (INDEPENDENT_AMBULATORY_CARE_PROVIDER_SITE_OTHER)

## 2021-05-11 DIAGNOSIS — Z1231 Encounter for screening mammogram for malignant neoplasm of breast: Secondary | ICD-10-CM | POA: Diagnosis not present

## 2021-05-18 LAB — RESULTS CONSOLE HPV: CHL HPV: NEGATIVE

## 2021-05-19 NOTE — Progress Notes (Signed)
Normal mammogram. Follow up in 1 year.

## 2021-08-30 ENCOUNTER — Encounter: Payer: Self-pay | Admitting: Neurology

## 2021-09-27 ENCOUNTER — Telehealth: Payer: Self-pay | Admitting: Neurology

## 2021-09-27 NOTE — Telephone Encounter (Signed)
Patient called and left vm, she has been out of work for a torn meniscus and her treating physician wrote her back to work today. She asked for a note to extend her time one more day. Per Luvenia Starch, she needs to call treating physician. Called patient back and let her daughter know.

## 2021-12-20 ENCOUNTER — Ambulatory Visit (INDEPENDENT_AMBULATORY_CARE_PROVIDER_SITE_OTHER): Admitting: Physician Assistant

## 2021-12-20 VITALS — BP 141/73 | HR 96 | Ht 63.0 in | Wt 200.0 lb

## 2021-12-20 DIAGNOSIS — R600 Localized edema: Secondary | ICD-10-CM

## 2021-12-20 DIAGNOSIS — Z131 Encounter for screening for diabetes mellitus: Secondary | ICD-10-CM

## 2021-12-20 DIAGNOSIS — Z0000 Encounter for general adult medical examination without abnormal findings: Secondary | ICD-10-CM

## 2021-12-20 DIAGNOSIS — Z23 Encounter for immunization: Secondary | ICD-10-CM | POA: Diagnosis not present

## 2021-12-20 DIAGNOSIS — Z6835 Body mass index (BMI) 35.0-35.9, adult: Secondary | ICD-10-CM

## 2021-12-20 DIAGNOSIS — K219 Gastro-esophageal reflux disease without esophagitis: Secondary | ICD-10-CM

## 2021-12-20 DIAGNOSIS — I10 Essential (primary) hypertension: Secondary | ICD-10-CM

## 2021-12-20 DIAGNOSIS — Z13220 Encounter for screening for lipoid disorders: Secondary | ICD-10-CM

## 2021-12-20 DIAGNOSIS — R5383 Other fatigue: Secondary | ICD-10-CM

## 2021-12-20 DIAGNOSIS — E559 Vitamin D deficiency, unspecified: Secondary | ICD-10-CM

## 2021-12-20 DIAGNOSIS — R1013 Epigastric pain: Secondary | ICD-10-CM

## 2021-12-20 DIAGNOSIS — R030 Elevated blood-pressure reading, without diagnosis of hypertension: Secondary | ICD-10-CM | POA: Insufficient documentation

## 2021-12-20 DIAGNOSIS — E6609 Other obesity due to excess calories: Secondary | ICD-10-CM

## 2021-12-20 HISTORY — DX: Essential (primary) hypertension: I10

## 2021-12-20 HISTORY — DX: Localized edema: R60.0

## 2021-12-20 MED ORDER — OMEPRAZOLE 40 MG PO CPDR: 40.0000 mg | DELAYED_RELEASE_CAPSULE | Freq: Every day | ORAL | 3 refills | Status: DC

## 2021-12-20 MED ORDER — HYDROCHLOROTHIAZIDE 12.5 MG PO TABS: 12.5000 mg | ORAL_TABLET | Freq: Every day | ORAL | 1 refills | Status: DC

## 2021-12-20 MED ORDER — VITAMIN D (ERGOCALCIFEROL) 1.25 MG (50000 UNIT) PO CAPS: 50000.0000 [IU] | ORAL_CAPSULE | ORAL | 3 refills | Status: DC

## 2021-12-20 NOTE — Progress Notes (Signed)
Complete physical exam  Patient: Pam Gomez   DOB: 03-Sep-1965   56 y.o. Female  MRN: [id number]  Subjective:    Chief Complaint  Patient presents with   Annual Exam    Pam Gomez is a 56 y.o. female who presents today for a complete physical exam. She reports consuming a general diet. The patient does not participate in regular exercise at present. She generally feels fairly well. She reports sleeping fairly well. She does have additional problems to discuss today.   Pt is most concerned with weight gain due to right knee pain. She sees Dr. Smitty Cords, orthopedic. 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 She just finished PT. She is not able to exercise due to pain. Not checking BP at home. She does not have energy.   Not checking BP at home.   Needs refills of omeprazole for as needed reflux.   She is having intermittent lower leg swelling. No SOB, cough, chest tightness or pressure.   Most recent fall risk assessment:    12/20/2021    3:07 PM  [place] in the past year? 0  Number falls in past yr: 0  Injury with Fall? 0  Risk for fall due to : No Fall Risks  Follow up Falls evaluation completed     Most recent depression screenings:    12/20/2021    3:07 PM 03/22/2020    3:57 PM  PHQ 2/9 Scores  PHQ - 2 Score 0 0  PHQ- 9 Score  2    Vision:Within last year and Dental: No current dental problems and Receives regular dental care  Patient Active Problem List   Diagnosis Date Noted   Primary hypertension 12/20/2021   Lower extremity edema 12/20/2021   Cervical dysfunction 01/09/2021   Grief reaction 01/06/2021   Disc disease, degenerative, cervical 09/07/2020   Wheezing 09/06/2020   Chest tightness 09/06/2020   Cough 09/06/2020   Tinnitus of both ears 08/29/2020   CKD (chronic kidney disease), 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   Gastroesophageal reflux disease 03/29/2020   Epigastric discomfort 03/29/2020   Acute pain of left shoulder 03/22/2020   Elevated LDL  cholesterol level 03/19/2019   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 12/16/2018   Change in stool caliber 02/19/2017   History of shingles 12/10/2016   Environmental allergies 12/10/2016   Class 2 obesity due to excess calories without serious comorbidity with body mass index (BMI) of 35.0 to 35.9 in adult 12/10/2016   Vitamin D insufficiency 12/10/2016   Elevated platelet count 07/12/2015   H/O colonoscopy 03/29/2015   Systemic lupus erythematosus ([place]) 03/21/2015   Sickle cell trait ([place]) 03/21/2015   History of non anemic vitamin B12 deficiency 03/21/2015   Chest discomfort 03/21/2015   Chronic fatigue 03/21/2015   Rupture of quadriceps tendon, sequela 07/05/2014   Past Medical History:  Diagnosis Date   Sickle cell trait ([place]) 03/21/2015   Systemic lupus erythematosus ([place]) 03/21/2015   Family History  Problem Relation Age of Onset   Diabetes Other    Hypertension Father    Depression Paternal Uncle    Hypertension Paternal Uncle    Diabetes Paternal Grandmother    Hypertension Paternal Grandmother    Stroke Paternal Grandmother    Alcohol abuse Paternal Grandfather    Hypertension Paternal Grandfather    Heart attack Paternal Aunt    Allergies  Allergen Reactions   Iodinated Contrast Media Nausea And Vomiting   Penicillins Hives  Patient Care Team: Lavada Mesi as PCP - General (Family Medicine)   Outpatient Medications Prior to Visit  Medication Sig   albuterol (PROVENTIL HFA;VENTOLIN HFA) 108 (90 Base) MCG/ACT inhaler Inhale 2 puffs into the lungs every 6 (six) hours as needed for wheezing or shortness of breath.   cycloSPORINE (RESTASIS) 0.05 % ophthalmic emulsion 1 drop 2 (two) times daily.   fexofenadine (ALLEGRA) 180 MG tablet Take 1 tablet (180 mg total) by mouth daily.   [DISCONTINUED] Vitamin D, Ergocalciferol, (DRISDOL) 1.25 MG (50000 UNIT) CAPS capsule Take 1 capsule (50,000 Units total) by mouth once a week. Labs for  refills   No facility-administered medications prior to visit.    ROS   See HPI. Right knee pain and inability to exercise and loose weight.      Objective:     BP (!) 141/73   Pulse 96   Ht _0  (1.6 m)   Wt 200 lb (90.7 kg)   LMP 11/08/2011   SpO2 97%   BMI 35.43 kg/m  BP Readings from Last 3 Encounters:  12/20/21 (!) 141/73  01/06/21 132/61  08/29/20 115/61   Wt Readings from Last 3 Encounters:  12/20/21 200 lb (90.7 kg)  01/06/21 195 lb (88.5 kg)  08/29/20 200 lb (90.7 kg)      Physical Exam  BP (!) 141/73   Pulse 96   Ht _1  (1.6 m)   Wt 200 lb (90.7 kg)   LMP 11/08/2011   SpO2 97%   BMI 35.43 kg/m   General Appearance:    Alert, cooperative, obese, no distress, appears stated age  Head:    Normocephalic, without obvious abnormality, atraumatic  Eyes:    PERRL, conjunctiva/corneas clear, EOM's intact, fundi    benign, both eyes  Ears:    Normal TM's and external ear canals, both ears  Nose:   Nares normal, septum midline, mucosa normal, no drainage    or sinus tenderness  Throat:   Lips, mucosa, and tongue normal; teeth and gums normal  Neck:   Supple, symmetrical, trachea midline, no adenopathy;    thyroid:  no enlargement/tenderness/nodules; no carotid   bruit or JVD  Back:     Symmetric, no curvature, ROM normal, no CVA tenderness  Lungs:     Clear to auscultation bilaterally, respirations unlabored  Chest Wall:    No tenderness or deformity   Heart:    Regular rate and rhythm, S1 and S2 normal, no murmur, rub   or gallop     Abdomen:     Soft, non-tender, bowel sounds active all four quadrants,    no masses, no organomegaly        Extremities:   Extremities normal, atraumatic, no cyanosis or edema  Pulses:   2+ and symmetric all extremities  Skin:   Skin color, texture, turgor normal, no rashes or lesions  Lymph nodes:   Cervical, supraclavicular, and axillary nodes normal  Neurologic:   CNII-XII intact, normal strength, sensation and  reflexes    throughout      Assessment & Plan:    Routine Health Maintenance and Physical Exam  Immunization History  Administered Date(s) Administered   COVID-19, mRNA, vaccine(Comirnaty)12 years and older 12/20/2021   H1N1 03/20/2008   Hepatitis A, Adult 05/15/2007, 12/12/2007   Hepatitis A, Ped/Adol-2 Dose 05/15/2007   Hepatitis B, PED/ADOLESCENT 05/15/2007, 11/05/2007, 12/12/2007, 06/11/2008   Hepatitis B, adult 05/15/2007, 11/05/2007, 12/12/2007, 06/11/2008   IPV 11/21/2013   Influenza Split 01/20/2013,  11/05/2013, 11/21/2020   Influenza, Seasonal, Injecte, Preservative Fre 11/23/2010, 03/11/2012, 11/13/2014   Influenza,inj,Quad PF,6+ Mos 01/20/2013, 11/05/2013, 12/17/2015, 10/03/2016, 11/03/2018, 03/06/2020   Influenza-Unspecified 11/26/2007, 11/05/2008, 11/07/2009, 11/13/2014, 10/30/2018   MMR 11/05/2013, 11/13/2014   PFIZER(Purple Top)SARS-COV-2 Vaccination 04/27/2019, 05/19/2019, 01/28/2020   Tdap 05/15/2007   Varicella 11/27/2013   Zoster Recombinat (Shingrix) 10/03/2016, 12/10/2016    Health Maintenance  Topic Date Due   COVID-19 Vaccine (4 - 2023-24 season) 01/05/2022 (Originally 09/29/2021)   INFLUENZA VACCINE  04/29/2022 (Originally 08/29/2021)   HIV Screening  12/21/2022 (Originally [birth year]/1979)   MAMMOGRAM  05/12/2023   COLONOSCOPY (Pts 45-79yr Insurance coverage will need to be confirmed)  03/24/2025   PAP SMEAR-Modifier  05/02/2026   Hepatitis C Screening  Completed   Zoster Vaccines- Shingrix  Completed   HPV VACCINES  Aged Out    Discussed health benefits of physical activity, and encouraged her to engage in regular exercise appropriate for her age and condition.  .Marland KitchenLattie Hawwas seen today for annual exam.  Diagnoses and all orders for this visit:  Routine physical examination -     Lipid Panel w/reflex Direct LDL -     COMPLETE METABOLIC PANEL WITH GFR -     TSH -     VITAMIN D 25 Hydroxy (Vit-D Deficiency, Fractures) -     B12 and Folate Panel -      CBC w/Diff/Platelet  Epigastric discomfort -     omeprazole (PRILOSEC) 40 MG capsule; Take 1 capsule (40 mg total) by mouth daily.  Gastroesophageal reflux disease, unspecified whether esophagitis present -     omeprazole (PRILOSEC) 40 MG capsule; Take 1 capsule (40 mg total) by mouth daily.  Vitamin D insufficiency -     Vitamin D, Ergocalciferol, (DRISDOL) 1.25 MG (50000 UNIT) CAPS capsule; Take 1 capsule (50,000 Units total) by mouth once a week. Labs for refills -     VITAMIN D 25 Hydroxy (Vit-D Deficiency, Fractures)  Screening for diabetes mellitus -     COMPLETE METABOLIC PANEL WITH GFR  Screening for lipid disorders -     Lipid Panel w/reflex Direct LDL  No energy -     TSH -     VITAMIN D 25 Hydroxy (Vit-D Deficiency, Fractures) -     B12 and Folate Panel -     CBC w/Diff/Platelet  Encounter for immunization -[employer] [place] [employer] 20[employer] Covid-19 Vaccine 142yrand older  Primary hypertension -     hydrochlorothiazide (HYDRODIURIL) 12.5 MG tablet; Take 1 tablet (12.5 mg total) by mouth daily.  Lower extremity edema -     hydrochlorothiazide (HYDRODIURIL) 12.5 MG tablet; Take 1 tablet (12.5 mg total) by mouth daily.  Class 2 obesity due to excess calories without serious comorbidity with body mass index (BMI) of 35.0 to 35.9 in adult     .. Marland Kitcheniscussed 150 minutes of exercise a week.  Encouraged vitamin D 1000 units and Calcium 130024mr 4 servings of dairy a day.  Fasting labs ordered Labs to evaluate fatigue PHQ to goal BP elevated with intermittent edema  Start HCTZ-recheck in 3 month Covid booster given today Will get flu shot at pharmacy Refilled omeprazole for GERD Pap/colonoscopy/mammogram UTD  Continue to follow up with orthopedic for knee pain.   JadIran PlanasA-C

## 2021-12-20 NOTE — Patient Instructions (Signed)

## 2021-12-25 ENCOUNTER — Encounter: Payer: Self-pay | Admitting: Physician Assistant

## 2021-12-25 DIAGNOSIS — R5383 Other fatigue: Secondary | ICD-10-CM | POA: Insufficient documentation

## 2021-12-25 HISTORY — DX: Other fatigue: R53.83

## 2022-01-02 LAB — COMPLETE METABOLIC PANEL WITH GFR
AG Ratio: 1.2 (calc) (ref 1.0–2.5)
ALT: 15 U/L (ref 6–29)
AST: 14 U/L (ref 10–35)
Albumin: 4.2 g/dL (ref 3.6–5.1)
Alkaline phosphatase (APISO): 55 U/L (ref 37–153)
BUN/Creatinine Ratio: 14 (calc) (ref 6–22)
BUN: 17 mg/dL (ref 7–25)
CO2: 30 mmol/L (ref 20–32)
Calcium: 9.7 mg/dL (ref 8.6–10.4)
Chloride: 105 mmol/L (ref 98–110)
Creat: 1.22 mg/dL — ABNORMAL HIGH (ref 0.50–1.03)
Globulin: 3.6 g/dL (calc) (ref 1.9–3.7)
Glucose, Bld: 100 mg/dL — ABNORMAL HIGH (ref 65–99)
Potassium: 4 mmol/L (ref 3.5–5.3)
Sodium: 142 mmol/L (ref 135–146)
Total Bilirubin: 0.4 mg/dL (ref 0.2–1.2)
Total Protein: 7.8 g/dL (ref 6.1–8.1)
eGFR: 52 mL/min/{1.73_m2} — ABNORMAL LOW (ref >=60)

## 2022-01-02 LAB — CBC WITH DIFFERENTIAL/PLATELET
Absolute Monocytes: 498 cells/uL (ref 200–950)
Basophils Absolute: 42 cells/uL (ref 0–200)
Basophils Relative: 0.8 %
Eosinophils Absolute: 260 cells/uL (ref 15–500)
Eosinophils Relative: 4.9 %
HCT: 37.3 % (ref 35.0–45.0)
Hemoglobin: 12.2 g/dL (ref 11.7–15.5)
Lymphs Abs: 1860 cells/uL (ref 850–3900)
MCH: 28.4 pg (ref 27.0–33.0)
MCHC: 32.7 g/dL (ref 32.0–36.0)
MCV: 86.7 fL (ref 80.0–100.0)
MPV: 9.6 fL (ref 7.5–12.5)
Monocytes Relative: 9.4 %
Neutro Abs: 2639 cells/uL (ref 1500–7800)
Neutrophils Relative %: 49.8 %
Platelets: 451 10*3/uL — ABNORMAL HIGH (ref 140–400)
RBC: 4.3 10*6/uL (ref 3.80–5.10)
RDW: 13.3 % (ref 11.0–15.0)
Total Lymphocyte: 35.1 %
WBC: 5.3 10*3/uL (ref 3.8–10.8)

## 2022-01-02 LAB — LIPID PANEL W/REFLEX DIRECT LDL
Cholesterol: 232 mg/dL — ABNORMAL HIGH (ref <200)
HDL: 49 mg/dL — ABNORMAL LOW (ref >=50)
LDL Cholesterol (Calc): 165 mg/dL (calc) — ABNORMAL HIGH
Non-HDL Cholesterol (Calc): 183 mg/dL (calc) — ABNORMAL HIGH (ref <130)
Total CHOL/HDL Ratio: 4.7 (calc) (ref <5.0)
Triglycerides: 81 mg/dL (ref <150)

## 2022-01-02 LAB — B12 AND FOLATE PANEL
Folate: 7.8 ng/mL
Vitamin B-12: 330 pg/mL (ref 200–1100)

## 2022-01-02 LAB — TSH: TSH: 2.94 mIU/L (ref 0.40–4.50)

## 2022-01-02 LAB — VITAMIN D 25 HYDROXY (VIT D DEFICIENCY, FRACTURES): Vit D, 25-Hydroxy: 41 ng/mL (ref 30–100)

## 2022-01-08 ENCOUNTER — Encounter: Payer: Self-pay | Admitting: Physician Assistant

## 2022-01-08 DIAGNOSIS — E785 Hyperlipidemia, unspecified: Secondary | ICD-10-CM

## 2022-01-08 HISTORY — DX: Hyperlipidemia, unspecified: E78.5

## 2022-01-08 NOTE — Progress Notes (Signed)
Pam Gomez,   B12 normal but on the low side. I would take a supplement daily.  Vitamin D normal range. Stay on Vitamin D.  Thyroid looks good.  Kidney function is down some. Stay hydrated. Hold ibuprofen/NSAIDs and recheck in 2 weeks.  LDL up. Your 10 year risk is 9.6 percent and above 7.5 percent. I would strongly recommend starting a statin to lower CV risk. Thoughts?   Marland KitchenMarland KitchenThe 10-year ASCVD risk score (Arnett DK, et al., 2019) is: 9.6%   Values used to calculate the score:     Age: 56 years     Sex: Female     Is Non-Hispanic African American: Yes     Diabetic: No     Tobacco smoker: No     Systolic Blood Pressure: 408 mmHg     Is BP treated: Yes     HDL Cholesterol: 49 mg/dL     Total Cholesterol: 232 mg/dL

## 2022-01-09 ENCOUNTER — Other Ambulatory Visit: Payer: Self-pay

## 2022-01-09 DIAGNOSIS — N182 Chronic kidney disease, stage 2 (mild): Secondary | ICD-10-CM

## 2022-01-09 DIAGNOSIS — M329 Systemic lupus erythematosus, unspecified: Secondary | ICD-10-CM

## 2022-01-09 MED ORDER — ATORVASTATIN CALCIUM 40 MG PO TABS: 40.0000 mg | ORAL_TABLET | Freq: Every day | ORAL | 3 refills | Status: DC

## 2022-01-09 NOTE — Addendum Note (Signed)
Addended byDonella Stade on: 01/09/2022 12:12 PM   Modules accepted: Orders

## 2022-03-14 ENCOUNTER — Encounter: Payer: Self-pay | Admitting: Pharmacist

## 2022-03-19 ENCOUNTER — Other Ambulatory Visit: Payer: Self-pay | Admitting: Pharmacist

## 2022-03-19 ENCOUNTER — Ambulatory Visit: Admitting: Physician Assistant

## 2022-03-19 NOTE — Progress Notes (Signed)
Patient appearing on report for [facility] - Hypertension Control report due to last documented ambulatory blood pressure of 141/73 on 12/20/2021. Next appointment with PCP is 03/20/22   Outreached patient to discuss hypertension control and medication management.   Current antihypertensives: hctz 12.73m daily,  Patient states had to back off of medication due to frequent urination, concern for side effects. She also correlates her elevated blood pressure was related to pain in her leg, for which she received a shot and is doing.  Patient does not have an automated upper arm home BP machine.  Current blood pressure readings: using wrist cuff at home, and at her local pharmacy. 131/68 about 2 weeks ago.  Current meal patterns: patient does express intentionally reducing salt intake.   Patient denies hypotensive signs and symptoms including dizziness, lightheadedness.  Patient denies hypertensive symptoms including headache, chest pain, shortness of breath.    Assessment/Plan: - Currently unknown control, patient has wrist cuff and checks at local pharmacy occasionally, not consistently taking prescribed hctz. - - Reviewed goal blood pressure <140/90, but ideally <130/80 if possible - Reviewed appropriate administration of medication regimen - Counseled on long term microvascular and macrovascular complications of uncontrolled hypertension - Reviewed appropriate home BP monitoring technique (avoid caffeine, smoking, and exercise for 30 minutes before checking, rest for at least 5 minutes before taking BP, sit with feet flat on the floor and back against a hard surface, uncross legs, and rest arm on flat surface) - Reviewed to check blood pressure 2-3x per week, document, and provide at next provider visit - Discussed dietary modifications, such as reduced salt intake, focus on whole grains, vegetables, lean proteins - Discussed goal of 150 minutes of moderate intensity physical  activity weekly - Recommend patient obtain "omron" brand upper arm blood pressure cuff for validated home readings, as her goal is to avoid BP medication if possible now that pain from leg is resolved and 11/2021 elevation was an outlier.  KLarinda Buttery PharmD Clinical Pharmacist [phone]

## 2022-03-19 NOTE — Patient Instructions (Signed)
Hi Pam Gomez chatting with you today. Remember to look for the "omron" brand upper arm blood pressure cuff, check pressures at home 2-3x per week, and jot down those numbers for your appointments with [facility].  Below is some helpful information about checking blood pressure:  Check your blood pressure twice weekly, and any time you have concerning symptoms like headache, chest pain, dizziness, shortness of breath, or vision changes.   Our goal is less than 140/90.  To appropriately check your blood pressure, make sure you do the following:  1) Avoid caffeine, exercise, or tobacco products for 30 minutes before checking. Empty your bladder. 2) Sit with your back supported in a flat-backed chair. Rest your arm on something flat (arm of the chair, table, etc). 3) Sit still with your feet flat on the floor, resting, for at least 5 minutes.  4) Check your blood pressure. Take 1-2 readings.  5) Write down these readings and bring with you to any provider appointments.  Bring your home blood pressure machine with you to a provider's office for accuracy comparison at least once a year.   Make sure you take your blood pressure medications before you come to any office visit, even if you were asked to fast for labs.    Take care,  Pam Gomez, PharmD Clinical Pharmacist [facility] [phone]

## 2022-03-20 ENCOUNTER — Ambulatory Visit: Admitting: Physician Assistant

## 2022-03-26 ENCOUNTER — Ambulatory Visit (INDEPENDENT_AMBULATORY_CARE_PROVIDER_SITE_OTHER): Admitting: Physician Assistant

## 2022-03-26 ENCOUNTER — Encounter: Payer: Self-pay | Admitting: Physician Assistant

## 2022-03-26 VITALS — BP 142/57 | HR 70 | Ht 63.0 in | Wt 205.8 lb

## 2022-03-26 DIAGNOSIS — N182 Chronic kidney disease, stage 2 (mild): Secondary | ICD-10-CM

## 2022-03-26 DIAGNOSIS — M329 Systemic lupus erythematosus, unspecified: Secondary | ICD-10-CM | POA: Diagnosis not present

## 2022-03-26 DIAGNOSIS — Z6836 Body mass index (BMI) 36.0-36.9, adult: Secondary | ICD-10-CM

## 2022-03-26 DIAGNOSIS — G8929 Other chronic pain: Secondary | ICD-10-CM

## 2022-03-26 DIAGNOSIS — E6609 Other obesity due to excess calories: Secondary | ICD-10-CM

## 2022-03-26 DIAGNOSIS — M25561 Pain in right knee: Secondary | ICD-10-CM

## 2022-03-26 DIAGNOSIS — E7800 Pure hypercholesterolemia, unspecified: Secondary | ICD-10-CM

## 2022-03-26 DIAGNOSIS — I10 Essential (primary) hypertension: Secondary | ICD-10-CM

## 2022-03-26 DIAGNOSIS — R030 Elevated blood-pressure reading, without diagnosis of hypertension: Secondary | ICD-10-CM

## 2022-03-26 MED ORDER — ZEPBOUND 2.5 MG/0.5ML ~~LOC~~ SOAJ: 2.5000 mg | SUBCUTANEOUS | 0 refills | Status: DC

## 2022-03-26 MED ORDER — WEGOVY 0.25 MG/0.5ML ~~LOC~~ SOAJ: 0.2500 mg | SUBCUTANEOUS | 0 refills | Status: DC

## 2022-03-26 MED ORDER — SAXENDA 18 MG/3ML ~~LOC~~ SOPN: 3.0000 mg | PEN_INJECTOR | Freq: Every day | SUBCUTANEOUS | 11 refills | Status: DC

## 2022-03-26 NOTE — Patient Instructions (Signed)
Wegovy Saxenda Zepbound  For weight loss.

## 2022-03-28 LAB — CBC WITH DIFFERENTIAL/PLATELET
Basophils Absolute: 0 10*3/uL (ref 0.0–0.2)
Basos: 1 %
EOS (ABSOLUTE): 0.3 10*3/uL (ref 0.0–0.4)
Eos: 6 %
Hematocrit: 37.2 % (ref 34.0–46.6)
Hemoglobin: 12.1 g/dL (ref 11.1–15.9)
Immature Grans (Abs): 0 10*3/uL (ref 0.0–0.1)
Immature Granulocytes: 0 %
Lymphocytes Absolute: 2 10*3/uL (ref 0.7–3.1)
Lymphs: 36 %
MCH: 28.3 pg (ref 26.6–33.0)
MCHC: 32.5 g/dL (ref 31.5–35.7)
MCV: 87 fL (ref 79–97)
Monocytes Absolute: 0.5 10*3/uL (ref 0.1–0.9)
Monocytes: 9 %
Neutrophils Absolute: 2.7 10*3/uL (ref 1.4–7.0)
Neutrophils: 48 %
Platelets: 379 10*3/uL (ref 150–450)
RBC: 4.27 x10E6/uL (ref 3.77–5.28)
RDW: 13.4 % (ref 11.7–15.4)
WBC: 5.7 10*3/uL (ref 3.4–10.8)

## 2022-03-28 LAB — SPECIMEN STATUS REPORT

## 2022-04-02 ENCOUNTER — Encounter: Payer: Self-pay | Admitting: Physician Assistant

## 2022-04-02 DIAGNOSIS — R030 Elevated blood-pressure reading, without diagnosis of hypertension: Secondary | ICD-10-CM | POA: Insufficient documentation

## 2022-04-02 HISTORY — DX: Elevated blood-pressure reading, without diagnosis of hypertension: R03.0

## 2022-04-02 NOTE — Progress Notes (Signed)
Established Patient Office Visit  Subjective   Patient ID: Pam Gomez, female    DOB: [birth year]  Age: 57 y.o. MRN: [id number]  Chief Complaint  Patient presents with   Follow-up    HPI Pt is a 57 yo obese female who presents to the clinic to discuss weight loss medications. She has tried many diets and lifestyle changes with no improvement. She is active but struggles with right knee pain. She is hoping weight loss will help with her other conditions.    Active Ambulatory Problems    Diagnosis Date Noted   Systemic lupus erythematosus ([place]) 03/21/2015   Sickle cell trait ([place]) 03/21/2015   History of non anemic vitamin B12 deficiency 03/21/2015   Chest discomfort 03/21/2015   Chronic fatigue 03/21/2015   H/O colonoscopy 03/29/2015   Elevated platelet count 07/12/2015   History of shingles 12/10/2016   Environmental allergies 12/10/2016   Class 2 obesity due to excess calories without serious comorbidity with body mass index (BMI) of 35.0 to 35.9 in adult 12/10/2016   Vitamin D insufficiency 12/10/2016   Change in stool caliber 02/19/2017   Rupture of quadriceps tendon, sequela 07/05/2014   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 12/16/2018   Elevated LDL cholesterol level 03/19/2019   Acute pain of left shoulder 03/22/2020   Gastroesophageal reflux disease 03/29/2020   Epigastric discomfort 03/29/2020   Tinnitus of both ears 08/29/2020   CKD (chronic kidney disease), 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   Wheezing 09/06/2020   Chest tightness 09/06/2020   Cough 09/06/2020   Disc disease, degenerative, cervical 09/07/2020   Grief reaction 01/06/2021   Cervical dysfunction 01/09/2021   Primary hypertension 12/20/2021   Lower extremity edema 12/20/2021   No energy 12/25/2021   Dyslipidemia (high LDL; low HDL) 01/08/2022   Asthma 08/26/2008   Elevated blood pressure reading 04/02/2022   Resolved Ambulatory Problems    Diagnosis Date Noted   Bilateral chronic  knee pain 07/12/2015   Acute pain of right knee 01/09/2021   No Additional Past Medical History     ROS    Objective:     BP (!) 142/57 (BP Location: Left Arm, Patient Position: Sitting, Cuff Size: Large)   Pulse 70   Ht '5\' 3"'$  (1.6 m)   Wt 205 lb 12.8 oz (93.4 kg)   LMP 11/08/2011   SpO2 100%   BMI 36.46 kg/m  BP Readings from Last 3 Encounters:  03/26/22 (!) 142/57  12/20/21 (!) 141/73  01/06/21 132/61   Wt Readings from Last 3 Encounters:  03/26/22 205 lb 12.8 oz (93.4 kg)  12/20/21 200 lb (90.7 kg)  01/06/21 195 lb (88.5 kg)      Physical Exam Constitutional:      Appearance: Normal appearance. She is obese.  Cardiovascular:     Rate and Rhythm: Normal rate and regular rhythm.  Pulmonary:     Effort: Pulmonary effort is normal.     Breath sounds: Normal breath sounds.  Neurological:     General: No focal deficit present.     Mental Status: She is alert and oriented to person, place, and time.  Psychiatric:        Mood and Affect: Mood normal.       The 10-year ASCVD risk score (Arnett DK, et al., 2019) is: 9.8%    Assessment & Plan:  Marland KitchenMarland KitchenWinta was seen today for follow-up.  Diagnoses and all orders for this visit:  Class 2 obesity due to excess calories without serious  comorbidity with body mass index (BMI) of 36.0 to 36.9 in adult -     tirzepatide (ZEPBOUND) 2.5 MG/0.5ML Pen; Inject 2.5 mg into the skin once a week. -     WEGOVY 0.25 MG/0.5ML SOAJ; Inject 0.25 mg into the skin once a week. Use this dose for 1 month (4 shots) and then increase to next higher dose. -     Liraglutide -Weight Management (SAXENDA) 18 MG/3ML SOPN; Inject 3 mg into the skin daily. 0.6 mg inj subcut daily for 1 week, then incr by 0.6 mg weekly until reaching 3 mg injected subcut daily  Elevated blood pressure reading -     tirzepatide (ZEPBOUND) 2.5 MG/0.5ML Pen; Inject 2.5 mg into the skin once a week. -     WEGOVY 0.25 MG/0.5ML SOAJ; Inject 0.25 mg into the skin once a  week. Use this dose for 1 month (4 shots) and then increase to next higher dose. -     Liraglutide -Weight Management (SAXENDA) 18 MG/3ML SOPN; Inject 3 mg into the skin daily. 0.6 mg inj subcut daily for 1 week, then incr by 0.6 mg weekly until reaching 3 mg injected subcut daily  CKD (chronic kidney disease), stage II -     tirzepatide (ZEPBOUND) 2.5 MG/0.5ML Pen; Inject 2.5 mg into the skin once a week. -     WEGOVY 0.25 MG/0.5ML SOAJ; Inject 0.25 mg into the skin once a week. Use this dose for 1 month (4 shots) and then increase to next higher dose. -     Liraglutide -Weight Management (SAXENDA) 18 MG/3ML SOPN; Inject 3 mg into the skin daily. 0.6 mg inj subcut daily for 1 week, then incr by 0.6 mg weekly until reaching 3 mg injected subcut daily  Systemic lupus erythematosus, unspecified SLE type, unspecified organ involvement status ([place]) -     tirzepatide (ZEPBOUND) 2.5 MG/0.5ML Pen; Inject 2.5 mg into the skin once a week. -     WEGOVY 0.25 MG/0.5ML SOAJ; Inject 0.25 mg into the skin once a week. Use this dose for 1 month (4 shots) and then increase to next higher dose. -     Liraglutide -Weight Management (SAXENDA) 18 MG/3ML SOPN; Inject 3 mg into the skin daily. 0.6 mg inj subcut daily for 1 week, then incr by 0.6 mg weekly until reaching 3 mg injected subcut daily  Primary hypertension -     tirzepatide (ZEPBOUND) 2.5 MG/0.5ML Pen; Inject 2.5 mg into the skin once a week. -     WEGOVY 0.25 MG/0.5ML SOAJ; Inject 0.25 mg into the skin once a week. Use this dose for 1 month (4 shots) and then increase to next higher dose. -     Liraglutide -Weight Management (SAXENDA) 18 MG/3ML SOPN; Inject 3 mg into the skin daily. 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 -     WEGOVY 0.25  MG/0.5ML SOAJ; Inject 0.25 mg into the skin once a week. Use this dose for 1 month (4 shots) and then increase to next higher dose. -     Liraglutide -Weight Management (SAXENDA) 18 MG/3ML SOPN; Inject 3 mg into the skin daily. 0.6 mg inj subcut daily for 1 week, then incr by 0.6 mg weekly until reaching 3 mg injected subcut  daily  Elevated LDL cholesterol level -     tirzepatide (ZEPBOUND) 2.5 MG/0.5ML Pen; Inject 2.5 mg into the skin once a week. -     WEGOVY 0.25 MG/0.5ML SOAJ; Inject 0.25 mg into the skin once a week. Use this dose for 1 month (4 shots) and then increase to next higher dose. -     Liraglutide -Weight Management (SAXENDA) 18 MG/3ML SOPN; Inject 3 mg into the skin daily. 0.6 mg inj subcut daily for 1 week, then incr by 0.6 mg weekly until reaching 3 mg injected subcut daily   .Marland KitchenDiscussed low carb diet with 1500 calories and 80g of protein.  Exercising at least 150 minutes a week.  My Fitness Pal could be a Microbiologist.  Pt has other co-morbidies.  Discussed all GLP-1 options Printed all and gave coupon cards Once started let me know and I can send over next dose Follow up in 3 months.  BP elevated hoping weight loss can help with this    Iran Planas, PA-C

## 2022-04-03 ENCOUNTER — Telehealth: Payer: Self-pay | Admitting: Neurology

## 2022-04-03 DIAGNOSIS — R030 Elevated blood-pressure reading, without diagnosis of hypertension: Secondary | ICD-10-CM

## 2022-04-03 DIAGNOSIS — E7800 Pure hypercholesterolemia, unspecified: Secondary | ICD-10-CM

## 2022-04-03 DIAGNOSIS — M329 Systemic lupus erythematosus, unspecified: Secondary | ICD-10-CM

## 2022-04-03 DIAGNOSIS — G8929 Other chronic pain: Secondary | ICD-10-CM

## 2022-04-03 DIAGNOSIS — Z6836 Body mass index (BMI) 36.0-36.9, adult: Secondary | ICD-10-CM

## 2022-04-03 DIAGNOSIS — N182 Chronic kidney disease, stage 2 (mild): Secondary | ICD-10-CM

## 2022-04-03 DIAGNOSIS — I10 Essential (primary) hypertension: Secondary | ICD-10-CM

## 2022-04-03 MED ORDER — ZEPBOUND 2.5 MG/0.5ML ~~LOC~~ SOAJ: 2.5000 mg | SUBCUTANEOUS | 0 refills | Status: DC

## 2022-04-03 NOTE — Telephone Encounter (Signed)
Patient called and states she would like to proceed with Zebound. She wants this sent to [facility]. RX sent.   Patient also asked about her recent labs and I did make her aware the CBC was normal.

## 2022-04-04 ENCOUNTER — Telehealth: Payer: Self-pay

## 2022-04-04 NOTE — Telephone Encounter (Signed)
The Zepbound needs a prior authorization.

## 2022-04-06 ENCOUNTER — Telehealth: Payer: Self-pay

## 2022-04-06 NOTE — Telephone Encounter (Signed)
Initiated Prior authorization [id number] 2.'5MG'$ /0.5ML pen-injectors Via: Covermymeds Case/Key:[id number] Status: approved  as of 04/06/22 Reason:PA has already submitted and is in process for this patient and drug.;[id number] Process; Notified Pt via: Mychart (investigating) completed via phone approved 03/27/22/10/11/22  Initiated Prior authorization [id number] '18MG'$ /3ML pen-injectors Via: Covermymeds Case/Key:[id number] Status: Pending as of 04/06/22 Reason:phentermine, Qsymia or its individual generic components, and Contrave are covered medications Notified Pt via: Mychart   Initiated Prior authorization [id number] 0.'25MG'$ /0.5ML auto-injectors/ 5 mg Via: Covermymeds Case/Key:[id number] Status: Pending as of 04/06/22 Reason:phentermine, Qsymia or its individual generic components, and Contrave  Notified Pt via: Mychart

## 2022-04-10 ENCOUNTER — Telehealth: Payer: Self-pay | Admitting: Physician Assistant

## 2022-04-10 DIAGNOSIS — E6609 Other obesity due to excess calories: Secondary | ICD-10-CM

## 2022-04-10 DIAGNOSIS — E7800 Pure hypercholesterolemia, unspecified: Secondary | ICD-10-CM

## 2022-04-10 DIAGNOSIS — N182 Chronic kidney disease, stage 2 (mild): Secondary | ICD-10-CM

## 2022-04-10 DIAGNOSIS — G8929 Other chronic pain: Secondary | ICD-10-CM

## 2022-04-10 DIAGNOSIS — R030 Elevated blood-pressure reading, without diagnosis of hypertension: Secondary | ICD-10-CM

## 2022-04-10 DIAGNOSIS — M329 Systemic lupus erythematosus, unspecified: Secondary | ICD-10-CM

## 2022-04-10 DIAGNOSIS — I10 Essential (primary) hypertension: Secondary | ICD-10-CM

## 2022-04-10 MED ORDER — ZEPBOUND 2.5 MG/0.5ML ~~LOC~~ SOAJ: 2.5000 mg | SUBCUTANEOUS | 0 refills | Status: DC

## 2022-04-10 NOTE — Telephone Encounter (Signed)
I did already explain to the patient it would be a titration RX when she asked for this to go to mail order pharmacy. Leanny Moeckel - please advise.

## 2022-04-10 NOTE — Telephone Encounter (Signed)
Ok sent 2.5 90 day supply. She will just do a slower titration up by changing dose every 3 months.

## 2022-04-10 NOTE — Telephone Encounter (Signed)
Pt called requesting her tirzepatide (ZEPBOUND) 2.5 MG/0.5ML Pen [id number] prescription be changed from a 30 day to a 90 day prescription. Pt was advised by [facility] to call the office and request this. Due to the pt paying the same amount for the 30 day prescription as she would a 90 day prescription.

## 2022-04-11 NOTE — Telephone Encounter (Signed)
Patient made aware.

## 2022-04-23 ENCOUNTER — Telehealth: Payer: Self-pay | Admitting: Physician Assistant

## 2022-04-23 DIAGNOSIS — G8929 Other chronic pain: Secondary | ICD-10-CM

## 2022-04-23 NOTE — Telephone Encounter (Signed)
Received incoming voicemail requesting ortho referral. Patient states she has an appointment on 04/25/22 with ortho [place] and needs referral in order to get injections. Please advise.    Please send referral to [facility] at [phone]

## 2022-04-24 NOTE — Telephone Encounter (Signed)
Last ortho referral was a year ago. Placed updated referral that can be sent. Thanks!

## 2022-04-24 NOTE — Telephone Encounter (Signed)
I have faxed referral to [place] as pt requested and I also called pt and let her know

## 2022-06-13 ENCOUNTER — Encounter: Payer: Self-pay | Admitting: Physician Assistant

## 2022-06-13 ENCOUNTER — Ambulatory Visit (INDEPENDENT_AMBULATORY_CARE_PROVIDER_SITE_OTHER): Admitting: Physician Assistant

## 2022-06-13 VITALS — BP 142/69 | HR 84 | Ht 63.0 in | Wt 184.0 lb

## 2022-06-13 DIAGNOSIS — Z6832 Body mass index (BMI) 32.0-32.9, adult: Secondary | ICD-10-CM | POA: Diagnosis not present

## 2022-06-13 DIAGNOSIS — E6609 Other obesity due to excess calories: Secondary | ICD-10-CM

## 2022-06-13 DIAGNOSIS — J189 Pneumonia, unspecified organism: Secondary | ICD-10-CM | POA: Diagnosis not present

## 2022-06-13 DIAGNOSIS — E538 Deficiency of other specified B group vitamins: Secondary | ICD-10-CM

## 2022-06-13 MED ORDER — ZEPBOUND 5 MG/0.5ML ~~LOC~~ SOAJ: 5.0000 mg | SUBCUTANEOUS | 1 refills | Status: DC

## 2022-06-13 MED ORDER — AZITHROMYCIN 250 MG PO TABS: ORAL_TABLET | ORAL | 0 refills | Status: DC

## 2022-06-13 MED ORDER — ALBUTEROL SULFATE HFA 108 (90 BASE) MCG/ACT IN AERS: 2.0000 | INHALATION_SPRAY | Freq: Four times a day (QID) | RESPIRATORY_TRACT | 2 refills | Status: DC | PRN

## 2022-06-13 MED ORDER — CYANOCOBALAMIN 1000 MCG/ML IJ SOLN
1000.0000 ug | Freq: Once | INTRAMUSCULAR | Status: AC
  Administered 2022-06-13: 1000 ug via INTRAMUSCULAR

## 2022-06-13 NOTE — Patient Instructions (Addendum)
Magnesium 400mg  at bedtime.  Increase zepbound to 5mg  weekly.

## 2022-06-13 NOTE — Addendum Note (Signed)
Addended by: Ernest Mallick A on: 06/13/2022 01:47 PM   Modules accepted: Orders

## 2022-06-13 NOTE — Progress Notes (Signed)
Established Patient Office Visit  Subjective   Patient ID: Pam Gomez, female    DOB: 02-06-1965  Age: 57 y.o. MRN: [id number]  No chief complaint on file.   HPI Pt is a 57 yo obese female who presents to the clinic to follow up on weight loss medications.   She is doing wonderful on zepbound. She is tolerating well. She is down from 2015 to 184 in 3 months. She is eating much less. Her cravings have decreased. She continues to ride her bike. No CP, palpitations, nausea, vomiting, diarrhea or constipation. She would like to increase dose.   She is very fatigued today. Her cough is productive and hit over the weekend. She feels SOB and chest tightness. Hx of allergies and wheezing. Out of her albuterol. No fever, chills, body aches.   .. Active Ambulatory Problems    Diagnosis Date Noted   Systemic lupus erythematosus (HCC) 03/21/2015   Sickle cell trait (HCC) 03/21/2015   History of non anemic vitamin B12 deficiency 03/21/2015   Chest discomfort 03/21/2015   Chronic fatigue 03/21/2015   H/O colonoscopy 03/29/2015   Elevated platelet count 07/12/2015   History of shingles 12/10/2016   Environmental allergies 12/10/2016   Class 1 obesity due to excess calories without serious comorbidity with body mass index (BMI) of 32.0 to 32.9 in adult 12/10/2016   Vitamin D insufficiency 12/10/2016   Change in stool caliber 02/19/2017   Rupture of quadriceps tendon, sequela 07/05/2014   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 12/16/2018   Elevated LDL cholesterol level 03/19/2019   Acute pain of left shoulder 03/22/2020   Gastroesophageal reflux disease 03/29/2020   Epigastric discomfort 03/29/2020   Tinnitus of both ears 08/29/2020   CKD (chronic kidney disease), 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   Wheezing 09/06/2020   Chest tightness 09/06/2020   Cough 09/06/2020   Disc disease, degenerative, cervical 09/07/2020   Grief reaction 01/06/2021   Cervical dysfunction 01/09/2021    Primary hypertension 12/20/2021   Lower extremity edema 12/20/2021   No energy 12/25/2021   Dyslipidemia (high LDL; low HDL) 01/08/2022   Asthma 08/26/2008   Elevated blood pressure reading 04/02/2022   Resolved Ambulatory Problems    Diagnosis Date Noted   Bilateral chronic knee pain 07/12/2015   Acute pain of right knee 01/09/2021   No Additional Past Medical History    ROS See HPI.    Objective:     BP (!) 142/69   Pulse 84   Ht 5\' 3"  (1.6 m)   Wt 184 lb (83.5 kg)   LMP 11/08/2011   SpO2 99%   BMI 32.59 kg/m  BP Readings from Last 3 Encounters:  06/13/22 (!) 142/69  03/26/22 (!) 142/57  12/20/21 (!) 141/73   Wt Readings from Last 3 Encounters:  06/13/22 184 lb (83.5 kg)  03/26/22 205 lb 12.8 oz (93.4 kg)  12/20/21 200 lb (90.7 kg)      Physical Exam Constitutional:      Appearance: Normal appearance. She is obese.  HENT:     Head: Normocephalic.  Cardiovascular:     Rate and Rhythm: Normal rate and regular rhythm.     Pulses: Normal pulses.     Heart sounds: Normal heart sounds.  Pulmonary:     Effort: Pulmonary effort is normal.     Breath sounds: Rhonchi and rales present. No wheezing.     Comments: Rales and rhonchi of left lower lung Musculoskeletal:     Cervical back: Normal  range of motion and neck supple. No tenderness.  Lymphadenopathy:     Cervical: No cervical adenopathy.  Neurological:     General: No focal deficit present.     Mental Status: She is alert and oriented to person, place, and time.  Psychiatric:        Mood and Affect: Mood normal.       The 10-year ASCVD risk score (Arnett DK, et al., 2019) is: 10.2%    Assessment & Plan:  Marland KitchenMarland KitchenDiagnoses and all orders for this visit:  Class 1 obesity due to excess calories without serious comorbidity with body mass index (BMI) of 32.0 to 32.9 in adult -     tirzepatide (ZEPBOUND) 5 MG/0.5ML Pen; Inject 5 mg into the skin once a week.  Community acquired pneumonia of left lower  lobe of lung -     albuterol (VENTOLIN HFA) 108 (90 Base) MCG/ACT inhaler; Inhale 2 puffs into the lungs every 6 (six) hours as needed for wheezing or shortness of breath. -     azithromycin (ZITHROMAX Z-PAK) 250 MG tablet; Take 2 tablets (500 mg) on  Day 1,  followed by 1 tablet (250 mg) once daily on Days 2 through 5.  Other orders -     Discontinue: azithromycin (ZITHROMAX Z-PAK) 250 MG tablet; Take 2 tablets (500 mg) on  Day 1,  followed by 1 tablet (250 mg) once daily on Days 2 through 5.   Great weight loss and tolerating zepbound great Increased to 5mg   Continue with diet and exercise Discussed SE Follow up in 6 months  Left lower lung sounds like pneumonia Sent zpak and albuterol  Rest and hydrate  Follow up if symptoms worsening or persistent   Tandy Gaw, PA-C

## 2022-07-02 ENCOUNTER — Other Ambulatory Visit: Payer: Self-pay

## 2022-07-02 DIAGNOSIS — R600 Localized edema: Secondary | ICD-10-CM

## 2022-07-02 DIAGNOSIS — K219 Gastro-esophageal reflux disease without esophagitis: Secondary | ICD-10-CM

## 2022-07-02 DIAGNOSIS — I10 Essential (primary) hypertension: Secondary | ICD-10-CM

## 2022-07-02 DIAGNOSIS — R1013 Epigastric pain: Secondary | ICD-10-CM

## 2022-07-02 MED ORDER — ATORVASTATIN CALCIUM 40 MG PO TABS: 40.0000 mg | ORAL_TABLET | Freq: Every day | ORAL | 1 refills | Status: DC

## 2022-07-02 MED ORDER — HYDROCHLOROTHIAZIDE 12.5 MG PO TABS
12.5000 mg | ORAL_TABLET | Freq: Every day | ORAL | 1 refills | Status: DC
Start: 2022-07-02 — End: 2023-09-03

## 2022-07-02 MED ORDER — OMEPRAZOLE 40 MG PO CPDR: 40.0000 mg | DELAYED_RELEASE_CAPSULE | Freq: Every day | ORAL | 1 refills | Status: DC

## 2022-09-25 ENCOUNTER — Ambulatory Visit: Admitting: Physician Assistant

## 2022-09-25 ENCOUNTER — Encounter: Payer: Self-pay | Admitting: Physician Assistant

## 2022-09-25 VITALS — BP 107/68 | HR 85 | Ht 63.25 in | Wt 180.5 lb

## 2022-09-25 DIAGNOSIS — R5383 Other fatigue: Secondary | ICD-10-CM

## 2022-09-25 DIAGNOSIS — K5903 Drug induced constipation: Secondary | ICD-10-CM

## 2022-09-25 DIAGNOSIS — E6609 Other obesity due to excess calories: Secondary | ICD-10-CM | POA: Diagnosis not present

## 2022-09-25 DIAGNOSIS — Z23 Encounter for immunization: Secondary | ICD-10-CM | POA: Diagnosis not present

## 2022-09-25 DIAGNOSIS — Z6831 Body mass index (BMI) 31.0-31.9, adult: Secondary | ICD-10-CM

## 2022-09-25 HISTORY — DX: Drug induced constipation: K59.03

## 2022-09-25 MED ORDER — ZEPBOUND 5 MG/0.5ML ~~LOC~~ SOAJ
5.0000 mg | SUBCUTANEOUS | 0 refills | Status: DC
Start: 2022-09-25 — End: 2023-01-17

## 2022-09-25 NOTE — Patient Instructions (Addendum)
Miralax up to twice a day Magnesium once daily at bedtime B complex  Constipation, Adult Constipation is when a person has fewer than three bowel movements in a week, has difficulty having a bowel movement, or has stools (feces) that are dry, hard, or larger than normal. Constipation may be caused by an underlying condition. It may become worse with age if a person takes certain medicines and does not take in enough fluids. Follow these instructions at home: Eating and drinking  Eat foods that have a lot of fiber, such as beans, whole grains, and fresh fruits and vegetables. Limit foods that are low in fiber and high in fat and processed sugars, such as fried or sweet foods. These include french fries, hamburgers, cookies, candies, and soda. Drink enough fluid to keep your urine pale yellow. General instructions Exercise regularly or as told by your health care provider. Try to do 150 minutes of moderate exercise each week. Use the bathroom when you have the urge to go. Do not hold it in. Take over-the-counter and prescription medicines only as told by your health care provider. This includes any fiber supplements. During bowel movements: Practice deep breathing while relaxing the lower abdomen. Practice pelvic floor relaxation. Watch your condition for any changes. Let your health care provider know about them. Keep all follow-up visits as told by your health care provider. This is important. Contact a health care provider if: You have pain that gets worse. You have a fever. You do not have a bowel movement after 4 days. You vomit. You are not hungry or you lose weight. You are bleeding from the opening between the buttocks (anus). You have thin, pencil-like stools. Get help right away if: You have a fever and your symptoms suddenly get worse. You leak stool or have blood in your stool. Your abdomen is bloated. You have severe pain in your abdomen. You feel dizzy or you  faint. Summary Constipation is when a person has fewer than three bowel movements in a week, has difficulty having a bowel movement, or has stools (feces) that are dry, hard, or larger than normal. Eat foods that have a lot of fiber, such as beans, whole grains, and fresh fruits and vegetables. Drink enough fluid to keep your urine pale yellow. Take over-the-counter and prescription medicines only as told by your health care provider. This includes any fiber supplements. This information is not intended to replace advice given to you by your health care provider. Make sure you discuss any questions you have with your health care provider. Document Revised: 11/29/2021 Document Reviewed: 11/29/2021 Elsevier Patient Education  2024 [employer].

## 2022-09-25 NOTE — Progress Notes (Signed)
Established Patient Office Visit  Subjective   Patient ID: Pam Gomez, female    DOB: 1965/11/17  Age: 57 y.o. MRN: [id number]  Chief Complaint  Patient presents with   Follow-up    Dx: UTI - given Macrobid   Weight Check    Patient here for titration up of zepbound. C/o s/e of fatigue and constipation. Was given docusate sodium but has not started this yet.     HPI Pt is a 57 yo obese female who presents to the clinic for weight follow up.   She is on zepbound for weight loss. She has lost 25lbs since March 2024. She is exercising at least 3 days a week. She is eating less but feeling much better. She is having some problems with low energy and constipation. She has not tried anything for constipation yet.   .. Active Ambulatory Problems    Diagnosis Date Noted   Systemic lupus erythematosus (HCC) 03/21/2015   Sickle cell trait (HCC) 03/21/2015   History of non anemic vitamin B12 deficiency 03/21/2015   Chest discomfort 03/21/2015   Chronic fatigue 03/21/2015   H/O colonoscopy 03/29/2015   Elevated platelet count 07/12/2015   History of shingles 12/10/2016   Environmental allergies 12/10/2016   Class 1 obesity due to excess calories without serious comorbidity with body mass index (BMI) of 31.0 to 31.9 in adult 12/10/2016   Vitamin D insufficiency 12/10/2016   Change in stool caliber 02/19/2017   Rupture of quadriceps tendon, sequela 07/05/2014   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 12/16/2018   Elevated LDL cholesterol level 03/19/2019   Acute pain of left shoulder 03/22/2020   Gastroesophageal reflux disease 03/29/2020   Epigastric discomfort 03/29/2020   Tinnitus of both ears 08/29/2020   CKD (chronic kidney disease), 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   Wheezing 09/06/2020   Chest tightness 09/06/2020   Cough 09/06/2020   Disc disease, degenerative, cervical 09/07/2020   Grief reaction 01/06/2021   Cervical dysfunction 01/09/2021   Primary hypertension  12/20/2021   Lower extremity edema 12/20/2021   No energy 12/25/2021   Dyslipidemia (high LDL; low HDL) 01/08/2022   Asthma 08/26/2008   Elevated blood pressure reading 04/02/2022   Drug-induced constipation 09/25/2022   Resolved Ambulatory Problems    Diagnosis Date Noted   Bilateral chronic knee pain 07/12/2015   Acute pain of right knee 01/09/2021   No Additional Past Medical History    ROS   See HPI.  Objective:     BP 107/68   Pulse 85   Ht 5' 3.25" (1.607 m)   Wt 180 lb 8 oz (81.9 kg)   LMP 11/08/2011   SpO2 98%   BMI 31.72 kg/m  BP Readings from Last 3 Encounters:  09/25/22 107/68  06/13/22 (!) 142/69  03/26/22 (!) 142/57   Wt Readings from Last 3 Encounters:  09/25/22 180 lb 8 oz (81.9 kg)  06/13/22 184 lb (83.5 kg)  03/26/22 205 lb 12.8 oz (93.4 kg)      Physical Exam Constitutional:      Appearance: Normal appearance. She is obese.  HENT:     Head: Normocephalic.  Cardiovascular:     Rate and Rhythm: Normal rate and regular rhythm.     Pulses: Normal pulses.     Heart sounds: Normal heart sounds.  Pulmonary:     Effort: Pulmonary effort is normal.     Breath sounds: Normal breath sounds.  Musculoskeletal:     Right lower leg: No edema.  Left lower leg: No edema.  Neurological:     General: No focal deficit present.     Mental Status: She is alert and oriented to person, place, and time.  Psychiatric:        Mood and Affect: Mood normal.      The 10-year ASCVD risk score (Arnett DK, et al., 2019) is: 4.3%    Assessment & Plan:  [name] was seen today for follow-up and weight check.  Diagnoses and all orders for this visit:  Class 1 obesity due to excess calories without serious comorbidity with body mass index (BMI) of 31.0 to 31.9 in adult -     tirzepatide (ZEPBOUND) 5 MG/0.5ML Pen; Inject 5 mg into the skin once a week.  Drug-induced constipation  Need for Tdap vaccination -     Tdap vaccine greater than or equal to 7yo  IM  Need for influenza vaccination  No energy  Encounter for immunization -     Flu vaccine trivalent PF, 6mos and older(Flulaval,Afluria,Fluarix,Fluzone)   Discussed constipation Trial of colace and miralax up to twice a day Discussed diet to help with constipation Trial of magnesium at bedtime Make sure drinking plenty of water Consider linzess if not improving  Continues to lose weight Stay at same 5mg  dose for now Follow up in 3-4 months  Will check labs for low energy at CPE in December Start B Complex for now   Return in about 4 months (around 01/25/2023), or if symptoms worsen or fail to improve, for Follow up, Needs CPE.    Tandy Gaw, PA-C

## 2022-10-10 ENCOUNTER — Telehealth: Payer: Self-pay

## 2022-10-10 NOTE — Telephone Encounter (Signed)
Initiated Prior authorization ZOX:[id number] 5MG /0.5ML pen-injectors  Via: Covermymeds Case/Key:[id number] Status: Pending as of 10/10/22 Reason: Notified Pt via: Mychart

## 2022-12-06 ENCOUNTER — Other Ambulatory Visit: Payer: Self-pay | Admitting: Physician Assistant

## 2022-12-06 DIAGNOSIS — Z1231 Encounter for screening mammogram for malignant neoplasm of breast: Secondary | ICD-10-CM

## 2022-12-14 ENCOUNTER — Ambulatory Visit: Admitting: Physician Assistant

## 2023-01-16 ENCOUNTER — Other Ambulatory Visit: Payer: Self-pay | Admitting: Physician Assistant

## 2023-01-16 DIAGNOSIS — E66811 Obesity, class 1: Secondary | ICD-10-CM

## 2023-01-17 ENCOUNTER — Ambulatory Visit

## 2023-01-17 DIAGNOSIS — Z1231 Encounter for screening mammogram for malignant neoplasm of breast: Secondary | ICD-10-CM

## 2023-01-18 ENCOUNTER — Other Ambulatory Visit: Payer: Self-pay

## 2023-01-18 DIAGNOSIS — I10 Essential (primary) hypertension: Secondary | ICD-10-CM

## 2023-01-18 DIAGNOSIS — E7800 Pure hypercholesterolemia, unspecified: Secondary | ICD-10-CM

## 2023-01-18 DIAGNOSIS — Z0000 Encounter for general adult medical examination without abnormal findings: Secondary | ICD-10-CM

## 2023-01-18 DIAGNOSIS — E559 Vitamin D deficiency, unspecified: Secondary | ICD-10-CM

## 2023-01-18 DIAGNOSIS — N182 Chronic kidney disease, stage 2 (mild): Secondary | ICD-10-CM

## 2023-01-21 NOTE — Progress Notes (Signed)
Normal mammogram. Follow up in 1 year.

## 2023-01-24 ENCOUNTER — Ambulatory Visit (INDEPENDENT_AMBULATORY_CARE_PROVIDER_SITE_OTHER)

## 2023-01-24 VITALS — BP 116/71 | HR 83 | Ht 63.25 in

## 2023-01-24 DIAGNOSIS — I10 Essential (primary) hypertension: Secondary | ICD-10-CM | POA: Diagnosis not present

## 2023-01-24 NOTE — Progress Notes (Signed)
   Established Patient Office Visit  Subjective   Patient ID: MYKIRA CHISOLM, female    DOB: [birth year]  Age: 57 y.o. MRN: [id number]  Chief Complaint  Patient presents with   Hypertension    BP check nurse visit.     HPI  Hypertension- BP check nurse visit. Patient  states feeling a"little off " today and requesting BP be checked. Patient is scheduled for physical with provider tomorrow.   ROS    Objective:     BP 116/71 (BP Location: Left Arm, Patient Position: Sitting, Cuff Size: Normal)   Pulse 83   Ht 5' 3.25" (1.607 m)   LMP 11/08/2011   SpO2 100%   BMI 31.72 kg/m    Physical Exam   No results found for any visits on 01/24/23.    The 10-year ASCVD risk score (Arnett DK, et al., 2019) is: 5.5%    Assessment & Plan:  BP check nurse visit.=  116/71. Patient mentions that she feels that might be related to sinuses or menopause symptoms and will speak to provider regarding this at visit scheduled for tomorrow.  Problem List Items Addressed This Visit   None   No follow-ups on file.    Elizabeth Palau, LPN

## 2023-01-25 ENCOUNTER — Ambulatory Visit (INDEPENDENT_AMBULATORY_CARE_PROVIDER_SITE_OTHER): Admitting: Physician Assistant

## 2023-01-25 VITALS — BP 122/63 | HR 77 | Wt 171.0 lb

## 2023-01-25 DIAGNOSIS — E6609 Other obesity due to excess calories: Secondary | ICD-10-CM

## 2023-01-25 DIAGNOSIS — R9431 Abnormal electrocardiogram [ECG] [EKG]: Secondary | ICD-10-CM

## 2023-01-25 DIAGNOSIS — Z0000 Encounter for general adult medical examination without abnormal findings: Secondary | ICD-10-CM | POA: Diagnosis not present

## 2023-01-25 DIAGNOSIS — R079 Chest pain, unspecified: Secondary | ICD-10-CM

## 2023-01-25 DIAGNOSIS — Z6830 Body mass index (BMI) 30.0-30.9, adult: Secondary | ICD-10-CM

## 2023-01-25 DIAGNOSIS — E66811 Obesity, class 1: Secondary | ICD-10-CM

## 2023-01-25 DIAGNOSIS — J0140 Acute pansinusitis, unspecified: Secondary | ICD-10-CM | POA: Diagnosis not present

## 2023-01-25 LAB — CMP14+EGFR
ALT: 14 [IU]/L (ref 0–32)
AST: 20 [IU]/L (ref 0–40)
Albumin: 4.2 g/dL (ref 3.8–4.9)
Alkaline Phosphatase: 67 [IU]/L (ref 44–121)
BUN/Creatinine Ratio: 9 (ref 9–23)
BUN: 10 mg/dL (ref 6–24)
Bilirubin Total: 0.5 mg/dL (ref 0.0–1.2)
CO2: 24 mmol/L (ref 20–29)
Calcium: 9.7 mg/dL (ref 8.7–10.2)
Chloride: 107 mmol/L — ABNORMAL HIGH (ref 96–106)
Creatinine, Ser: 1.06 mg/dL — ABNORMAL HIGH (ref 0.57–1.00)
Globulin, Total: 3.3 g/dL (ref 1.5–4.5)
Glucose: 89 mg/dL (ref 70–99)
Potassium: 3.9 mmol/L (ref 3.5–5.2)
Sodium: 144 mmol/L (ref 134–144)
Total Protein: 7.5 g/dL (ref 6.0–8.5)
eGFR: 61 mL/min/{1.73_m2} (ref >59)

## 2023-01-25 LAB — LIPID PANEL
Chol/HDL Ratio: 5.3 {ratio} — ABNORMAL HIGH (ref 0.0–4.4)
Cholesterol, Total: 230 mg/dL — ABNORMAL HIGH (ref 100–199)
HDL: 43 mg/dL (ref >39)
LDL Chol Calc (NIH): 171 mg/dL — ABNORMAL HIGH (ref 0–99)
Triglycerides: 91 mg/dL (ref 0–149)
VLDL Cholesterol Cal: 16 mg/dL (ref 5–40)

## 2023-01-25 LAB — CBC WITH DIFFERENTIAL/PLATELET
Basophils Absolute: 0.1 10*3/uL (ref 0.0–0.2)
Basos: 1 %
EOS (ABSOLUTE): 0.3 10*3/uL (ref 0.0–0.4)
Eos: 4 %
Hematocrit: 37.3 % (ref 34.0–46.6)
Hemoglobin: 11.8 g/dL (ref 11.1–15.9)
Immature Grans (Abs): 0 10*3/uL (ref 0.0–0.1)
Immature Granulocytes: 0 %
Lymphocytes Absolute: 2.4 10*3/uL (ref 0.7–3.1)
Lymphs: 37 %
MCH: 27.3 pg (ref 26.6–33.0)
MCHC: 31.6 g/dL (ref 31.5–35.7)
MCV: 86 fL (ref 79–97)
Monocytes Absolute: 0.6 10*3/uL (ref 0.1–0.9)
Monocytes: 9 %
Neutrophils Absolute: 3.2 10*3/uL (ref 1.4–7.0)
Neutrophils: 49 %
Platelets: 506 10*3/uL — ABNORMAL HIGH (ref 150–450)
RBC: 4.32 x10E6/uL (ref 3.77–5.28)
RDW: 13.1 % (ref 11.7–15.4)
WBC: 6.5 10*3/uL (ref 3.4–10.8)

## 2023-01-25 LAB — VITAMIN D 25 HYDROXY (VIT D DEFICIENCY, FRACTURES): Vit D, 25-Hydroxy: 37.7 ng/mL (ref 30.0–100.0)

## 2023-01-25 MED ORDER — AZITHROMYCIN 250 MG PO TABS: ORAL_TABLET | ORAL | 0 refills | Status: DC

## 2023-01-25 MED ORDER — FLUTICASONE PROPIONATE 50 MCG/ACT NA SUSP: 2.0000 | Freq: Every day | NASAL | 0 refills | Status: DC

## 2023-01-25 NOTE — Progress Notes (Unsigned)
Complete physical exam  Patient: Pam Gomez   DOB: [birth year]   56 y.o. Female  MRN: [id number]  Subjective:    No chief complaint on file.   Pam Gomez is a 57 y.o. female who presents today for a complete physical exam. She reports consuming a general and low carb and high protein  diet.  She walks regularly throughout the week.   She generally feels fairly well. She reports sleeping fairly well. She does have additional problems to discuss today.    Most recent fall risk assessment:    03/26/2022    3:08 PM  Fall Risk   Falls in the past year? 0  Number falls in past yr: 0  Injury with Fall? 0  Risk for fall due to : History of fall(s)     Most recent depression screenings:    03/26/2022    3:08 PM 12/20/2021    3:07 PM  PHQ 2/9 Scores  PHQ - 2 Score 0 0    Vision:Within last year and Dental: No current dental problems and Receives regular dental care  Patient Active Problem List   Diagnosis Date Noted   Drug-induced constipation 09/25/2022   Elevated blood pressure reading 04/02/2022   Dyslipidemia (high LDL; low HDL) 01/08/2022   No energy 12/25/2021   Primary hypertension 12/20/2021   Lower extremity edema 12/20/2021   Cervical dysfunction 01/09/2021   Grief reaction 01/06/2021   Disc disease, degenerative, cervical 09/07/2020   Wheezing 09/06/2020   Chest tightness 09/06/2020   Cough 09/06/2020   Tinnitus of both ears 08/29/2020   CKD (chronic kidney disease), 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   Gastroesophageal reflux disease 03/29/2020   Epigastric discomfort 03/29/2020   Acute pain of left shoulder 03/22/2020   Elevated LDL cholesterol level 03/19/2019   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 12/16/2018   Change in stool caliber 02/19/2017   History of shingles 12/10/2016   Environmental allergies 12/10/2016   Class 1 obesity due to excess calories without serious comorbidity with body mass index (BMI) of 31.0 to 31.9 in adult  12/10/2016   Vitamin D insufficiency 12/10/2016   Elevated platelet count 07/12/2015   H/O colonoscopy 03/29/2015   Systemic lupus erythematosus (HCC) 03/21/2015   Sickle cell trait (HCC) 03/21/2015   History of non anemic vitamin B12 deficiency 03/21/2015   Chest discomfort 03/21/2015   Chronic fatigue 03/21/2015   Rupture of quadriceps tendon, sequela 07/05/2014   Asthma 08/26/2008   Past Medical History:  Diagnosis Date   Sickle cell trait (HCC) 03/21/2015   Systemic lupus erythematosus (HCC) 03/21/2015   Past Surgical History:  Procedure Laterality Date   CESAREAN SECTION  2002   QUADRICEPS TENDON REPAIR  07/06/2000   Family History  Problem Relation Age of Onset   Diabetes Other    Hypertension Father    Depression Paternal Uncle    Hypertension Paternal Uncle    Diabetes Paternal Grandmother    Hypertension Paternal Grandmother    Stroke Paternal Grandmother    Alcohol abuse Paternal Grandfather    Hypertension Paternal Grandfather    Heart attack Paternal Aunt    Allergies  Allergen Reactions   Iodinated Contrast Media Nausea And Vomiting   Penicillins Hives      Patient Care Team: Nolene Ebbs as PCP - General (Family Medicine)   Outpatient Medications Prior to Visit  Medication Sig   albuterol (VENTOLIN HFA) 108 (90 Base) MCG/ACT inhaler Inhale 2 puffs into the lungs  every 6 (six) hours as needed for wheezing or shortness of breath.   atorvastatin (LIPITOR) 40 MG tablet Take 1 tablet (40 mg total) by mouth daily.   cycloSPORINE (RESTASIS) 0.05 % ophthalmic emulsion 1 drop 2 (two) times daily.   fexofenadine (ALLEGRA) 180 MG tablet Take 1 tablet (180 mg total) by mouth daily.   hydrochlorothiazide (HYDRODIURIL) 12.5 MG tablet Take 1 tablet (12.5 mg total) by mouth daily.   omeprazole (PRILOSEC) 40 MG capsule Take 1 capsule (40 mg total) by mouth daily.   tirzepatide (ZEPBOUND) 5 MG/0.5ML Pen INJECT 5 MG SUBCUTANEOUSLY WEEKLY   Vitamin D,  Ergocalciferol, (DRISDOL) 1.25 MG (50000 UNIT) CAPS capsule Take 1 capsule (50,000 Units total) by mouth once a week. Labs for refills   No facility-administered medications prior to visit.    ROS        Objective:     LMP 11/08/2011  BP Readings from Last 3 Encounters:  01/24/23 116/71  09/25/22 107/68  06/13/22 (!) 142/69   Wt Readings from Last 3 Encounters:  09/25/22 180 lb 8 oz (81.9 kg)  06/13/22 184 lb (83.5 kg)  03/26/22 205 lb 12.8 oz (93.4 kg)      Physical Exam      Assessment & Plan:    Routine Health Maintenance and Physical Exam  Immunization History  Administered Date(s) Administered   H1N1 03/20/2008   Hepatitis A, Adult 05/15/2007, 12/12/2007   Hepatitis A, Ped/Adol-2 Dose 05/15/2007   Hepatitis B, ADULT 05/15/2007, 11/05/2007, 12/12/2007, 06/11/2008   Hepatitis B, PED/ADOLESCENT 05/15/2007, 11/05/2007, 12/12/2007, 06/11/2008   IPV 11/21/2013   Influenza Split 01/20/2013, 11/05/2013, 11/21/2020   Influenza, Seasonal, Injecte, Preservative Fre 11/23/2010, 03/11/2012, 11/13/2014, 09/25/2022   Influenza,inj,Quad PF,6+ Mos 01/20/2013, 11/05/2013, 12/17/2015, 10/03/2016, 11/03/2018, 03/06/2020   Influenza-Unspecified 11/26/2007, 11/05/2008, 11/07/2009, 11/13/2014, 10/30/2018   MMR 11/05/2013, 11/13/2014   Novel Infuenza-h1n1-09 03/20/2008   PFIZER(Purple Top)SARS-COV-2 Vaccination 04/27/2019, 05/19/2019, 01/28/2020   Pfizer(Comirnaty)Fall Seasonal Vaccine 12 years and older 12/20/2021   Tdap 05/15/2007, 09/25/2022   Varicella 11/27/2013   Zoster Recombinant(Shingrix) 10/03/2016, 12/10/2016    Health Maintenance  Topic Date Due   HIV Screening  Never done   COVID-19 Vaccine (5 - 2024-25 season) 09/30/2022   MAMMOGRAM  01/16/2025   Colonoscopy  03/24/2025   Cervical Cancer Screening (HPV/Pap Cotest)  05/19/2026   INFLUENZA VACCINE  Completed   Hepatitis C Screening  Completed   Zoster Vaccines- Shingrix  Completed   HPV VACCINES  Aged Out    DTaP/Tdap/Td  Discontinued    Discussed health benefits of physical activity, and encouraged her to engage in regular exercise appropriate for her age and condition.  Marland Kitchen.The 10-year ASCVD risk score (Arnett DK, et al., 2019) is: 6%   Values used to calculate the score:     Age: 87 years     Sex: Female     Is Non-Hispanic African American: Yes     Diabetic: No     Tobacco smoker: No     Systolic Blood Pressure: 116 mmHg     Is BP treated: Yes     HDL Cholesterol: 43 mg/dL     Total Cholesterol: 230 mg/dL        Tandy Gaw, PA-C

## 2023-01-25 NOTE — Progress Notes (Signed)
Will discuss in clinic today. Please print off labs today.

## 2023-01-25 NOTE — Patient Instructions (Addendum)
Restart lipitor to help lower LDL and decrease CV risk.   Will order Stress Test.   Start AsA 81mg  a day.   Health Maintenance, Female Adopting a healthy lifestyle and getting preventive care are important in promoting health and wellness. Ask your health care provider about: The right schedule for you to have regular tests and exams. Things you can do on your own to prevent diseases and keep yourself healthy. What should I know about diet, weight, and exercise? Eat a healthy diet  Eat a diet that includes plenty of vegetables, fruits, low-fat dairy products, and lean protein. Do not eat a lot of foods that are high in solid fats, added sugars, or sodium. Maintain a healthy weight Body mass index (BMI) is used to identify weight problems. It estimates body fat based on height and weight. Your health care provider can help determine your BMI and help you achieve or maintain a healthy weight. Get regular exercise Get regular exercise. This is one of the most important things you can do for your health. Most adults should: Exercise for at least 150 minutes each week. The exercise should increase your heart rate and make you sweat (moderate-intensity exercise). Do strengthening exercises at least twice a week. This is in addition to the moderate-intensity exercise. Spend less time sitting. Even light physical activity can be beneficial. Watch cholesterol and blood lipids Have your blood tested for lipids and cholesterol at 58 years of age, then have this test every 5 years. Have your cholesterol levels checked more often if: Your lipid or cholesterol levels are high. You are older than 58 years of age. You are at high risk for heart disease. What should I know about cancer screening? Depending on your health history and family history, you may need to have cancer screening at various ages. This may include screening for: Breast cancer. Cervical cancer. Colorectal cancer. Skin  cancer. Lung cancer. What should I know about heart disease, diabetes, and high blood pressure? Blood pressure and heart disease High blood pressure causes heart disease and increases the risk of stroke. This is more likely to develop in people who have high blood pressure readings or are overweight. Have your blood pressure checked: Every 3-5 years if you are 39-29 years of age. Every year if you are 65 years old or older. Diabetes Have regular diabetes screenings. This checks your fasting blood sugar level. Have the screening done: Once every three years after age 17 if you are at a normal weight and have a low risk for diabetes. More often and at a younger age if you are overweight or have a high risk for diabetes. What should I know about preventing infection? Hepatitis B If you have a higher risk for hepatitis B, you should be screened for this virus. Talk with your health care provider to find out if you are at risk for hepatitis B infection. Hepatitis C Testing is recommended for: Everyone born from [birth year] through 1966. Anyone with known risk factors for hepatitis C. Sexually transmitted infections (STIs) Get screened for STIs, including gonorrhea and chlamydia, if: You are sexually active and are younger than 58 years of age. You are older than 58 years of age and your health care provider tells you that you are at risk for this type of infection. Your sexual activity has changed since you were last screened, and you are at increased risk for chlamydia or gonorrhea. Ask your health care provider if you are at risk. Ask your  health care provider about whether you are at high risk for HIV. Your health care provider may recommend a prescription medicine to help prevent HIV infection. If you choose to take medicine to prevent HIV, you should first get tested for HIV. You should then be tested every 3 months for as long as you are taking the medicine. Pregnancy If you are about to stop  having your period (premenopausal) and you may become pregnant, seek counseling before you get pregnant. Take 400 to 800 micrograms (mcg) of folic acid every day if you become pregnant. Ask for birth control (contraception) if you want to prevent pregnancy. Osteoporosis and menopause Osteoporosis is a disease in which the bones lose minerals and strength with aging. This can result in bone fractures. If you are 34 years old or older, or if you are at risk for osteoporosis and fractures, ask your health care provider if you should: Be screened for bone loss. Take a calcium or vitamin D supplement to lower your risk of fractures. Be given hormone replacement therapy (HRT) to treat symptoms of menopause. Follow these instructions at home: Alcohol use Do not drink alcohol if: Your health care provider tells you not to drink. You are pregnant, may be pregnant, or are planning to become pregnant. If you drink alcohol: Limit how much you have to: 0-1 drink a day. Know how much alcohol is in your drink. In the [place] one drink equals one 12 oz bottle of beer (355 mL), one 5 oz glass of wine (148 mL), or one 1 oz glass of hard liquor (44 mL). Lifestyle Do not use any products that contain nicotine or tobacco. These products include cigarettes, chewing tobacco, and vaping devices, such as e-cigarettes. If you need help quitting, ask your health care provider. Do not use street drugs. Do not share needles. Ask your health care provider for help if you need support or information about quitting drugs. General instructions Schedule regular health, dental, and eye exams. Stay current with your vaccines. Tell your health care provider if: You often feel depressed. You have ever been abused or do not feel safe at home. Summary Adopting a healthy lifestyle and getting preventive care are important in promoting health and wellness. Follow your health care provider's instructions about healthy diet,  exercising, and getting tested or screened for diseases. Follow your health care provider's instructions on monitoring your cholesterol and blood pressure. This information is not intended to replace advice given to you by your health care provider. Make sure you discuss any questions you have with your health care provider. Document Revised: 06/06/2020 Document Reviewed: 06/06/2020 Elsevier Patient Education  2024 [employer].

## 2023-01-28 ENCOUNTER — Encounter: Payer: Self-pay | Admitting: Physician Assistant

## 2023-01-28 DIAGNOSIS — R079 Chest pain, unspecified: Secondary | ICD-10-CM | POA: Insufficient documentation

## 2023-01-28 DIAGNOSIS — R9431 Abnormal electrocardiogram [ECG] [EKG]: Secondary | ICD-10-CM

## 2023-01-28 HISTORY — DX: Chest pain, unspecified: R07.9

## 2023-01-28 HISTORY — DX: Abnormal electrocardiogram (ECG) (EKG): R94.31

## 2023-02-19 ENCOUNTER — Other Ambulatory Visit: Payer: Self-pay | Admitting: Physician Assistant

## 2023-02-19 DIAGNOSIS — J0140 Acute pansinusitis, unspecified: Secondary | ICD-10-CM

## 2023-03-07 ENCOUNTER — Telehealth: Payer: Self-pay

## 2023-03-07 NOTE — Telephone Encounter (Signed)
 Spoke with patient and forwarded copy of recent immunizations via e-mail.

## 2023-03-07 NOTE — Telephone Encounter (Signed)
 Would you please assist with referral question for stress test ( looks like was sent to cardiology as referral on 01/28/23 and patient has not heard from anyone for scheduling.   I will address immunization questions.

## 2023-03-07 NOTE — Telephone Encounter (Signed)
 Copied from CRM [id number]. Topic: Clinical - Request for Lab/Test Order >> Mar 06, 2023  4:12 PM Joesph PARAS wrote: Reason for CRM: Patient calling to request an update on scheduling for her stress test. Patient states she received a call from [facility] and it was not addressed. Patient requesting it be scheduled or an update be provided ASAP.  Patient is also requesting a letter of immunization. The letter needs to inform her [employer] that she has been immunized from flu, tetanus, and any other similar immunizations.

## 2023-04-27 ENCOUNTER — Other Ambulatory Visit: Payer: Self-pay | Admitting: Physician Assistant

## 2023-04-27 DIAGNOSIS — E6609 Other obesity due to excess calories: Secondary | ICD-10-CM

## 2023-04-27 DIAGNOSIS — E66811 Obesity, class 1: Secondary | ICD-10-CM

## 2023-04-30 ENCOUNTER — Other Ambulatory Visit: Payer: Self-pay | Admitting: Physician Assistant

## 2023-04-30 DIAGNOSIS — E66811 Obesity, class 1: Secondary | ICD-10-CM

## 2023-04-30 NOTE — Telephone Encounter (Signed)
 Last Fill: 01/17/23 2 mL/0 RF  Last OV: 01/25/23 CPE Next OV: 05/06/23  Routing to provider for review/authorization.

## 2023-04-30 NOTE — Telephone Encounter (Signed)
 Copied from CRM [id number]. Topic: Clinical - Medication Refill >> Apr 30, 2023 10:05 AM Corin V wrote: Most Recent Primary Care Visit:  Provider: Jomarie Longs  Department: [facility]  Visit Type: PHYSICAL  Date: 01/25/2023  Medication: tirzepatide (ZEPBOUND) 5 MG/0.5ML Pen  Has the patient contacted their pharmacy? Yes (Agent: If no, request that the patient contact the pharmacy for the refill. If patient does not wish to contact the pharmacy document the reason why and proceed with request.) (Agent: If yes, when and what did the pharmacy advise?)  Is this the correct pharmacy for this prescription? Yes If no, delete pharmacy and type the correct one.  This is the patient's preferred pharmacy:  [facility] [phone] - [place], [place] - [address] [place] [address] Phone: [phone] Fax: [phone]   Has the prescription been filled recently? No  Is the patient out of the medication? No  Has the patient been seen for an appointment in the last year OR does the patient have an upcoming appointment? Yes  Can we respond through MyChart? No  Agent: Please be advised that Rx refills may take up to 3 business days. We ask that you follow-up with your pharmacy.

## 2023-05-01 ENCOUNTER — Telehealth: Payer: Self-pay

## 2023-05-01 ENCOUNTER — Telehealth: Payer: Self-pay | Admitting: Physician Assistant

## 2023-05-01 NOTE — Telephone Encounter (Signed)
 Called the patient regarding their request. No answer. Patient informed to return a call back to the clinic. Direct call back info provided.

## 2023-05-01 NOTE — Telephone Encounter (Signed)
 Patient called in about her ETT needing to be scheduled. Please advise as the patient has been waiting since December.

## 2023-05-01 NOTE — Telephone Encounter (Signed)
 Spoke with [facility], scheduled ETT for 05/07/2023 at 3:15pm. Patient was given the following address/instructions:  [facility] [address] [place], [place] [address]  [phone]  Pt asked to wear comfortable clothes and shoes.

## 2023-05-01 NOTE — Telephone Encounter (Signed)
 Copied from CRM [id number]. Topic: Clinical - Prescription Issue >> May 01, 2023 11:07 AM Maree Krabbe H wrote: Reason for CRM: Patient called and stated that the pharmacy has not received her rx just yet and checking per the med notes, a note was sent Notes to Pharmacy: DX Code Needed. Patients callback number is [phone]. Also she needs a follow up from her EKG as well.

## 2023-05-01 NOTE — Telephone Encounter (Signed)
 Patient called in stating that she needs PA for Zepbound 5mg . Please advise

## 2023-05-01 NOTE — Telephone Encounter (Signed)
Thank you, resent referral.

## 2023-05-02 ENCOUNTER — Telehealth (HOSPITAL_COMMUNITY): Payer: Self-pay | Admitting: *Deleted

## 2023-05-02 ENCOUNTER — Telehealth: Payer: Self-pay

## 2023-05-02 ENCOUNTER — Other Ambulatory Visit (HOSPITAL_COMMUNITY): Payer: Self-pay

## 2023-05-02 ENCOUNTER — Telehealth: Payer: Self-pay | Admitting: Physician Assistant

## 2023-05-02 NOTE — Telephone Encounter (Signed)
 Pharmacy Patient Advocate Encounter   Received notification from Pt Calls Messages that prior authorization for Zepbound 5 is required/requested.   Insurance verification completed.   The patient is insured through [employer] .   Per test claim: The current 28 day co-pay is, $43.00.  No PA needed at this time. This test claim was processed through [facility]- copay amounts may vary at other pharmacies due to pharmacy/plan contracts, or as the patient moves through the different stages of their insurance plan.

## 2023-05-02 NOTE — Telephone Encounter (Signed)
 Reminder call for upcoming stress test on 05/07/23 at 3:30

## 2023-05-02 NOTE — Telephone Encounter (Signed)
 Requesting rx rf of zepbound 5mg   Note attached to request reads Refused Yesterday (05/01/2023):  Request already responded to by other means (e.g. phone or fax)   I did not see where medication had been refilled for patient so sending for your review.  Last written as zepbound 5mg   01/17/2023 Last OV 01/25/2023 Upcoming appt 05/06/2023

## 2023-05-02 NOTE — Telephone Encounter (Signed)
 Error

## 2023-05-03 MED ORDER — ZEPBOUND 7.5 MG/0.5ML ~~LOC~~ SOAJ: 7.5000 mg | SUBCUTANEOUS | 0 refills | Status: DC

## 2023-05-06 ENCOUNTER — Ambulatory Visit (INDEPENDENT_AMBULATORY_CARE_PROVIDER_SITE_OTHER): Admitting: Physician Assistant

## 2023-05-06 VITALS — BP 130/70 | HR 77 | Ht 63.0 in | Wt 177.0 lb

## 2023-05-06 DIAGNOSIS — N182 Chronic kidney disease, stage 2 (mild): Secondary | ICD-10-CM | POA: Diagnosis not present

## 2023-05-06 DIAGNOSIS — R0602 Shortness of breath: Secondary | ICD-10-CM

## 2023-05-06 DIAGNOSIS — E66811 Obesity, class 1: Secondary | ICD-10-CM | POA: Diagnosis not present

## 2023-05-06 DIAGNOSIS — R079 Chest pain, unspecified: Secondary | ICD-10-CM

## 2023-05-06 DIAGNOSIS — E7800 Pure hypercholesterolemia, unspecified: Secondary | ICD-10-CM

## 2023-05-06 DIAGNOSIS — I10 Essential (primary) hypertension: Secondary | ICD-10-CM

## 2023-05-06 DIAGNOSIS — E6609 Other obesity due to excess calories: Secondary | ICD-10-CM

## 2023-05-06 DIAGNOSIS — Z6831 Body mass index (BMI) 31.0-31.9, adult: Secondary | ICD-10-CM

## 2023-05-06 DIAGNOSIS — J189 Pneumonia, unspecified organism: Secondary | ICD-10-CM

## 2023-05-06 MED ORDER — ALBUTEROL SULFATE HFA 108 (90 BASE) MCG/ACT IN AERS: 2.0000 | INHALATION_SPRAY | Freq: Four times a day (QID) | RESPIRATORY_TRACT | 2 refills | Status: AC | PRN

## 2023-05-06 NOTE — Progress Notes (Unsigned)
   Established Patient Office Visit  Subjective   Patient ID: VICKY SCHLEICH, female    DOB: 13-Feb-1965  Age: 58 y.o. MRN: [id number]  Chief Complaint  Patient presents with   Medical Management of Chronic Issues    general follow up for labs and EKG and medication questions, weight check    HPI Pt is a 58 yo obese female with SLE, CKD stage II, HTN, GERD who presents to the clinic for medication refill and follow up.   Pt is on 5mg  of zepbound and doing well. No side effects. Needs next dose. Was out of it for like a week and gained a few pound but overall so far lost 25lbs. No concerns. She is active and exercising.   Pt continues to have some intermittent left sided chest pain. She has stress test tomorrow scheduled. She admits she has stopped statin due to side effects that she has heard about them. She wants labs rechecked.    ROS See HPI    Objective:     BP 130/70   Pulse 77   Ht 5\' 3"  (1.6 m)   Wt 177 lb (80.3 kg)   LMP 11/08/2011   SpO2 99%   BMI 31.35 kg/m  BP Readings from Last 3 Encounters:  05/06/23 130/70  01/25/23 122/63  01/24/23 116/71   Wt Readings from Last 3 Encounters:  05/06/23 177 lb (80.3 kg)  01/25/23 171 lb (77.6 kg)  09/25/22 180 lb 8 oz (81.9 kg)      Physical Exam Constitutional:      Appearance: Normal appearance.  HENT:     Head: Normocephalic.  Cardiovascular:     Rate and Rhythm: Normal rate.  Pulmonary:     Effort: Pulmonary effort is normal.  Musculoskeletal:     Right lower leg: No edema.     Left lower leg: No edema.  Neurological:     General: No focal deficit present.     Mental Status: She is alert.  Psychiatric:        Mood and Affect: Mood normal.        The 10-year ASCVD risk score (Arnett DK, et al., 2019) is: 8%    Assessment & Plan:  Marland KitchenMarland KitchenAminat was seen today for medical management of chronic issues.  Diagnoses and all orders for this visit:  Class 1 obesity due to excess calories without serious  comorbidity with body mass index (BMI) of 31.0 to 31.9 in adult  Primary hypertension -     CMP14+EGFR  CKD (chronic kidney disease), stage II -     CMP14+EGFR  Left-sided chest pain  Elevated LDL cholesterol level -     Lipid panel  Shortness of breath -     albuterol (VENTOLIN HFA) 108 (90 Base) MCG/ACT inhaler; Inhale 2 puffs into the lungs every 6 (six) hours as needed for wheezing or shortness of breath.   Zepbound 7.5mg  already sent Continue to work on healthy diet and exercise Cmp and lipid to be rechecked Discussed CV risk and how statin therapy does decrease overall risk Continue with cardiology and encouraged her to have conversation with them as well BP to goal today   Tandy Gaw, PA-C

## 2023-05-06 NOTE — Patient Instructions (Signed)
 Next zepbound dose is 10mg 

## 2023-05-07 ENCOUNTER — Encounter: Payer: Self-pay | Admitting: Physician Assistant

## 2023-05-07 ENCOUNTER — Ambulatory Visit (HOSPITAL_COMMUNITY): Attending: Physician Assistant

## 2023-05-07 DIAGNOSIS — R079 Chest pain, unspecified: Secondary | ICD-10-CM | POA: Insufficient documentation

## 2023-05-07 DIAGNOSIS — R0602 Shortness of breath: Secondary | ICD-10-CM | POA: Insufficient documentation

## 2023-05-07 DIAGNOSIS — R9431 Abnormal electrocardiogram [ECG] [EKG]: Secondary | ICD-10-CM | POA: Diagnosis present

## 2023-05-07 HISTORY — DX: Shortness of breath: R06.02

## 2023-05-07 LAB — EXERCISE TOLERANCE TEST
Angina Index: 1
Duke Treadmill Score: -13
Estimated workload: 7
Exercise duration (min): 6 min
Exercise duration (sec): 0 s
MPHR: 162 {beats}/min
Peak HR: 164 {beats}/min
Percent HR: 101 %
Rest HR: 85 {beats}/min
ST Elevation (mm): 3 mm

## 2023-05-07 LAB — CMP14+EGFR
ALT: 16 IU/L (ref 0–32)
AST: 20 IU/L (ref 0–40)
Albumin: 3.9 g/dL (ref 3.8–4.9)
Alkaline Phosphatase: 66 IU/L (ref 44–121)
BUN/Creatinine Ratio: 10 (ref 9–23)
BUN: 9 mg/dL (ref 6–24)
Bilirubin Total: 0.2 mg/dL (ref 0.0–1.2)
CO2: 23 mmol/L (ref 20–29)
Calcium: 9.5 mg/dL (ref 8.7–10.2)
Chloride: 104 mmol/L (ref 96–106)
Creatinine, Ser: 0.92 mg/dL (ref 0.57–1.00)
Globulin, Total: 3.1 g/dL (ref 1.5–4.5)
Glucose: 94 mg/dL (ref 70–99)
Potassium: 3.8 mmol/L (ref 3.5–5.2)
Sodium: 142 mmol/L (ref 134–144)
Total Protein: 7 g/dL (ref 6.0–8.5)
eGFR: 72 mL/min/{1.73_m2} (ref >59)

## 2023-05-07 LAB — LIPID PANEL
Chol/HDL Ratio: 5.1 ratio — ABNORMAL HIGH (ref 0.0–4.4)
Cholesterol, Total: 185 mg/dL (ref 100–199)
HDL: 36 mg/dL — ABNORMAL LOW (ref >39)
LDL Chol Calc (NIH): 99 mg/dL (ref 0–99)
Triglycerides: 293 mg/dL — ABNORMAL HIGH (ref 0–149)
VLDL Cholesterol Cal: 50 mg/dL — ABNORMAL HIGH (ref 5–40)

## 2023-05-07 NOTE — Progress Notes (Signed)
 Pam Gomez,   TG up from 3 months ago.  HDL, good cholesterol, down from 3 months ago.  LDL is better! How long have you not taken statin?   Kidney, liver, glucose look good.

## 2023-05-08 ENCOUNTER — Other Ambulatory Visit: Payer: Self-pay | Admitting: Physician Assistant

## 2023-05-08 ENCOUNTER — Encounter: Payer: Self-pay | Admitting: Physician Assistant

## 2023-05-08 DIAGNOSIS — R079 Chest pain, unspecified: Secondary | ICD-10-CM

## 2023-05-08 DIAGNOSIS — R9439 Abnormal result of other cardiovascular function study: Secondary | ICD-10-CM

## 2023-05-08 DIAGNOSIS — E785 Hyperlipidemia, unspecified: Secondary | ICD-10-CM

## 2023-05-08 MED ORDER — ATORVASTATIN CALCIUM 40 MG PO TABS: 40.0000 mg | ORAL_TABLET | Freq: Every day | ORAL | 1 refills | Status: DC

## 2023-05-08 NOTE — Progress Notes (Signed)
 Pam Gomez,   Your stress test was positive and concerning for some blockage of blood supply to heart. I am making referral to cardiologist that read it so she can order more test.

## 2023-05-09 ENCOUNTER — Encounter: Payer: Self-pay | Admitting: *Deleted

## 2023-05-09 ENCOUNTER — Ambulatory Visit: Payer: Self-pay

## 2023-05-09 NOTE — Telephone Encounter (Signed)
  Chief Complaint: L arm pain, nausea Symptoms: arm pain, intermittent nausea Frequency: yesterday Pertinent Negatives: Patient denies SOB, CP, difficulty breathing, diaphoresis Disposition: [] ED /[] Urgent Care (no appt availability in office) / [x] Appointment(In office/virtual)/ []  [facility]/ [] Home Care/ [] Refused Recommended Disposition /[] [facility]/ []  Follow-up with PCP Additional Notes: Pt states that she just had a stress test done a few days ago. Pt states that she now is having L shoulder pain and intermittent nausea. Pt states that the bar at her stress test was higher than normal, unsure if she strained it there. Pt states that she went for a walk yesterday and it did not make the pain better nor worse. Pt advised to seek ED treatment if she develops CP, SOB, diaphoresis. Pt agreeable. Pt scheduled for tomorrow.   Copied from CRM [id number]. Topic: Clinical - Lab/Test Results >> May 09, 2023 12:43 PM Hamdi H wrote: Reason for CRM: Patient received her stress test results and would like to discuss it further with a nurse. Reason for Disposition  [1] MILD pain (e.g., does not interfere with normal activities) AND [2] present > 7 days  Answer Assessment - Initial Assessment Questions 1. ONSET: "When did the pain start?"     Left after the stress test 2. LOCATION: "Where is the pain located?"     Upper arm/shoulder to mid bicept 3. PAIN: "How bad is the pain?" (Scale 1-10; or mild, moderate, severe)   - MILD (1-3): Doesn't interfere with normal activities.   - MODERATE (4-7): Interferes with normal activities (e.g., work or school) or awakens from sleep.   - SEVERE (8-10): Excruciating pain, unable to do any normal activities, unable to hold a cup of water.     4 4. WORK OR EXERCISE: "Has there been any recent work or exercise that involved this part of the body?"     denies 5. CAUSE: "What do you think is causing the arm pain?"     Denies, pt states that  maybe she tensed during the stress test, states the bar on the treadmill was higher than she is use to 6. OTHER SYMPTOMS: "Do you have any other symptoms?" (e.g., neck pain, swelling, rash, fever, numbness, weakness)     nausea  Protocols used: Arm Pain-A-AH

## 2023-05-10 ENCOUNTER — Encounter: Payer: Self-pay | Admitting: Physician Assistant

## 2023-05-10 ENCOUNTER — Telehealth (INDEPENDENT_AMBULATORY_CARE_PROVIDER_SITE_OTHER): Admitting: Physician Assistant

## 2023-05-10 VITALS — BP 140/80 | Ht 63.0 in | Wt 177.0 lb

## 2023-05-10 DIAGNOSIS — M79602 Pain in left arm: Secondary | ICD-10-CM | POA: Diagnosis not present

## 2023-05-10 DIAGNOSIS — R9431 Abnormal electrocardiogram [ECG] [EKG]: Secondary | ICD-10-CM

## 2023-05-10 DIAGNOSIS — R9439 Abnormal result of other cardiovascular function study: Secondary | ICD-10-CM

## 2023-05-10 DIAGNOSIS — R079 Chest pain, unspecified: Secondary | ICD-10-CM | POA: Diagnosis not present

## 2023-05-10 DIAGNOSIS — M549 Dorsalgia, unspecified: Secondary | ICD-10-CM | POA: Diagnosis not present

## 2023-05-10 HISTORY — DX: Dorsalgia, unspecified: M54.9

## 2023-05-10 HISTORY — DX: Abnormal result of other cardiovascular function study: R94.39

## 2023-05-10 HISTORY — DX: Pain in left arm: M79.602

## 2023-05-10 HISTORY — DX: Abnormal electrocardiogram (ECG) (EKG): R94.31

## 2023-05-10 LAB — CBC WITH DIFFERENTIAL/PLATELET
Basophils Absolute: 0 10*3/uL (ref 0.0–0.2)
Basos: 1 %
EOS (ABSOLUTE): 0.2 10*3/uL (ref 0.0–0.4)
Eos: 3 %
Hematocrit: 34.8 % (ref 34.0–46.6)
Hemoglobin: 11.9 g/dL (ref 11.1–15.9)
Immature Granulocytes: 1 %
Lymphocytes Absolute: 2.2 10*3/uL (ref 0.7–3.1)
Lymphs: 35 %
MCH: 27.9 pg (ref 26.6–33.0)
MCHC: 34.2 g/dL (ref 31.5–35.7)
MCV: 82 fL (ref 79–97)
Monocytes Absolute: 0.7 10*3/uL (ref 0.1–0.9)
Monocytes: 11 %
Neutrophils Absolute: 3.2 10*3/uL (ref 1.4–7.0)
Neutrophils: 49 %
Platelets: 484 10*3/uL — ABNORMAL HIGH (ref 150–450)
RBC: 4.26 x10E6/uL (ref 3.77–5.28)
RDW: 12.8 % (ref 11.7–15.4)
WBC: 6.4 10*3/uL (ref 3.4–10.8)

## 2023-05-10 LAB — D-DIMER, QUANTITATIVE

## 2023-05-10 LAB — CK TOTAL AND CKMB (NOT AT ARMC)
CK-MB Index: 1.5 ng/mL (ref 0.0–5.3)
Total CK: 160 U/L (ref 32–182)

## 2023-05-10 LAB — TROPONIN T

## 2023-05-10 MED ORDER — CYCLOBENZAPRINE HCL 10 MG PO TABS: 10.0000 mg | ORAL_TABLET | Freq: Three times a day (TID) | ORAL | 0 refills | Status: DC | PRN

## 2023-05-10 NOTE — Progress Notes (Signed)
 BP elevated and not to goal.

## 2023-05-10 NOTE — Progress Notes (Signed)
..  Virtual Visit via Video Note  I connected with Pam Gomez on 05/10/23 at 11:30 AM EDT by a video enabled telemedicine application and verified that I am speaking with the correct person using two identifiers.  Location: Patient: home Provider: clinic  .Marland KitchenParticipating in visit:  Patient: Pam Gomez Provider: Tandy Gaw PA-C   I discussed the limitations of evaluation and management by telemedicine and the availability of in person appointments. The patient expressed understanding and agreed to proceed.  History of Present Illness: Pt is a 58 yo female who calls into the clinic with worsening left upper back and arm pain since her stress test. She is having some intermittent left sided chest pain as well but same as before the stress test. Denies any SOB. No injury.      This test is postive for ischemia. Recommend further stress testing: exercise nuclear stress test vs coronary CTA   Exercise capacity was poor. Patient exercised for 6 min and 0 sec. Maximum HR of 164 bpm. MPHR 101.0%. Peak METS 7.0.   horizontal ST depression (II, III, aVF, V5 and V6) was noted.   Hypertensive blood pressure response noted during stress.   Observations/Objective: No acute distress Normal breathing  Not able to collect BP at home. I am bringing into office for labs where requested patient to have BP checked.    Assessment and Plan: Marland KitchenMarland KitchenNeylan was seen today for medical management of chronic issues.  Diagnoses and all orders for this visit:  Positive cardiac stress test -     CBC w/Diff/Platelet -     Troponin T -     CK total and CKMB (cardiac)not at [facility] -     D-Dimer, Quantitative  Left-sided chest pain -     CBC w/Diff/Platelet -     Troponin T -     CK total and CKMB (cardiac)not at [facility] -     D-Dimer, Quantitative  Left arm pain -     CBC w/Diff/Platelet -     Troponin T -     CK total and CKMB (cardiac)not at [facility] -     D-Dimer, Quantitative  Upper back pain on left side -      cyclobenzaprine (FLEXERIL) 10 MG tablet; Take 1 tablet (10 mg total) by mouth 3 (three) times daily as needed for muscle spasms. -     CBC w/Diff/Platelet -     Troponin T -     CK total and CKMB (cardiac)not at [facility] -     D-Dimer, Quantitative   Concerned about her symptoms being cardio She has a very positive stress test Referral placed for cardiology and appt scheduled for April 23rd Will have patient come into office for cardiac labs and BP check If cardiac enzymes elevated patient needs to go to hospital Pt has restarted statin If normal start flexeril and MSK symptomatic care for left upper back and arm pain Start ASA 81mg  daily    Follow Up Instructions:    I discussed the assessment and treatment plan with the patient. The patient was provided an opportunity to ask questions and all were answered. The patient agreed with the plan and demonstrated an understanding of the instructions.   The patient was advised to call back or seek an in-person evaluation if the symptoms worsen or if the condition fails to improve as anticipated.    Tandy Gaw, PA-C

## 2023-05-10 NOTE — Progress Notes (Signed)
 CK levels are normal. GREAT news.  CBC normal.   Troponin and d-dimer are showing canceled. Can we look into this [facility] or another nurse please.

## 2023-05-13 ENCOUNTER — Telehealth: Payer: Self-pay

## 2023-05-13 ENCOUNTER — Other Ambulatory Visit: Payer: Self-pay

## 2023-05-13 DIAGNOSIS — R9439 Abnormal result of other cardiovascular function study: Secondary | ICD-10-CM

## 2023-05-13 DIAGNOSIS — R079 Chest pain, unspecified: Secondary | ICD-10-CM

## 2023-05-13 DIAGNOSIS — M549 Dorsalgia, unspecified: Secondary | ICD-10-CM

## 2023-05-13 DIAGNOSIS — M79602 Pain in left arm: Secondary | ICD-10-CM

## 2023-05-13 NOTE — Telephone Encounter (Signed)
 Patient states she was unhappy with customer service regarding recent Triage phone call about symptoms and concerns ( this may be encounter date 05/09/2023) . Requesting a call to discuss this with office manager.

## 2023-05-13 NOTE — Telephone Encounter (Signed)
 Called the patient back and apologized for the experience with nurse triage via our call center. I will send a message to the [employer] supervisors so they can investigate the call further. Patient stated it was the nurse triage when called in to give a message her provider about her onset symptoms, she did get an appointment schedule but she felt ignored and rushed off the phone. Encounter date 05/09/23

## 2023-05-14 ENCOUNTER — Other Ambulatory Visit: Payer: Self-pay | Admitting: Physician Assistant

## 2023-05-14 DIAGNOSIS — R7989 Other specified abnormal findings of blood chemistry: Secondary | ICD-10-CM

## 2023-05-14 DIAGNOSIS — M79602 Pain in left arm: Secondary | ICD-10-CM

## 2023-05-14 DIAGNOSIS — R0602 Shortness of breath: Secondary | ICD-10-CM

## 2023-05-14 DIAGNOSIS — R079 Chest pain, unspecified: Secondary | ICD-10-CM

## 2023-05-14 LAB — TROPONIN T

## 2023-05-14 MED ORDER — PREDNISONE 50 MG PO TABS: ORAL_TABLET | ORAL | 0 refills | Status: DC

## 2023-05-14 NOTE — Progress Notes (Signed)
 Troponin canceled again. Please call patient and let her know. D-dimer was positive and we need to do CTA. Pt is allergic to contrast. Can we call radiology and see what is best to order?

## 2023-05-15 ENCOUNTER — Ambulatory Visit: Payer: Self-pay

## 2023-05-15 NOTE — Telephone Encounter (Signed)
 No triage: Pt was calling office back to let Kim (CAL) know the imaging was sent up and the medication needed was in hand. Pam Gomez was notified. Pt had no other questions or concerns at this time.          Copied from CRM [id number]. Topic: Clinical - Lab/Test Results >> May 09, 2023 12:43 PM Pam Gomez wrote: Reason for CRM: Patient received her stress test results and would like to discuss it further with a nurse. Reason for Disposition  Health Information question, no triage required and triager able to answer question  Answer Assessment - Initial Assessment Questions 1. REASON FOR CALL or QUESTION: "What is your reason for calling today?" or "How can I best help you?" or "What question do you have that I can help answer?"     To let Kim (CAL) know imaging was already set up.  Protocols used: Information Only Call - No Triage-A-AH

## 2023-05-16 ENCOUNTER — Ambulatory Visit (HOSPITAL_BASED_OUTPATIENT_CLINIC_OR_DEPARTMENT_OTHER)
Admission: RE | Admit: 2023-05-16 | Discharge: 2023-05-16 | Disposition: A | Discharge status: Home / Self Care | Source: Ambulatory Visit | Attending: Physician Assistant | Admitting: Physician Assistant

## 2023-05-16 DIAGNOSIS — M79602 Pain in left arm: Secondary | ICD-10-CM | POA: Insufficient documentation

## 2023-05-16 DIAGNOSIS — R0602 Shortness of breath: Secondary | ICD-10-CM | POA: Insufficient documentation

## 2023-05-16 DIAGNOSIS — R079 Chest pain, unspecified: Secondary | ICD-10-CM | POA: Diagnosis present

## 2023-05-16 DIAGNOSIS — R7989 Other specified abnormal findings of blood chemistry: Secondary | ICD-10-CM | POA: Insufficient documentation

## 2023-05-16 MED ORDER — IOHEXOL 350 MG/ML SOLN
75.0000 mL | Freq: Once | INTRAVENOUS | Status: AC | PRN
  Administered 2023-05-16: 75 mL via INTRAVENOUS

## 2023-05-20 ENCOUNTER — Encounter: Payer: Self-pay | Admitting: Physician Assistant

## 2023-05-20 NOTE — Progress Notes (Signed)
 No PE. No abnormal findings in heart or lungs.

## 2023-05-21 ENCOUNTER — Encounter: Payer: Self-pay | Admitting: Physician Assistant

## 2023-05-22 ENCOUNTER — Ambulatory Visit

## 2023-05-22 ENCOUNTER — Encounter: Payer: Self-pay | Admitting: Emergency Medicine

## 2023-05-22 VITALS — BP 116/72 | HR 81 | Ht 63.0 in | Wt 175.6 lb

## 2023-05-22 DIAGNOSIS — R0789 Other chest pain: Secondary | ICD-10-CM

## 2023-05-22 DIAGNOSIS — R9431 Abnormal electrocardiogram [ECG] [EKG]: Secondary | ICD-10-CM | POA: Diagnosis not present

## 2023-05-22 MED ORDER — METOPROLOL TARTRATE 100 MG PO TABS: 100.0000 mg | ORAL_TABLET | Freq: Once | ORAL | 0 refills | Status: DC

## 2023-05-22 MED ORDER — PREDNISONE 50 MG PO TABS: ORAL_TABLET | ORAL | 0 refills | Status: DC

## 2023-05-22 MED ORDER — ASPIRIN 81 MG PO TBEC: 81.0000 mg | DELAYED_RELEASE_TABLET | Freq: Every day | ORAL | Status: DC

## 2023-05-22 MED ORDER — NITROGLYCERIN 0.4 MG SL SUBL: 0.4000 mg | SUBLINGUAL_TABLET | SUBLINGUAL | 6 refills | Status: DC | PRN

## 2023-05-22 NOTE — Progress Notes (Signed)
 Cardiology Consultation:    Date:  05/22/2023   ID:  Pam Gomez, DOB 1965/07/11, MRN [id number]  PCP:  Araceli Knight, PA-C  Cardiologist:  Angelena Kells, MD   Referring MD: Araceli Knight, PA-C   No chief complaint on file.   ASSESSMENT AND PLAN:   Pam Gomez 58 year old woman reports history of lupus, obesity, elevated blood pressures at times in the past on treatment with low-dose hydrochlorothiazide , GERD, CKD stage II, mild sleep apnea on sleep study October 2022, abnormal PFTs in September 2022 with possible restrictive lung. No prior history of CAD, CHF, MI, CVA.   With atypical symptoms of left shoulder discomfort and upper back pain which appear musculoskeletal in nature, now seen for abnormal exercise treadmill stress test. Mildly elevated D-dimer but without significant abnormalities on CTA chest from April 2025.  Problem List Items Addressed This Visit     Chest discomfort - Primary   Atypical musculoskeletal sounding shoulder and upper back discomfort.  No typical chest pain features to suggest angina. Good functional capacity. Symptoms that she describes occurring intermittently for the past few months appear to occur with stressful situation at her work or or other emotionally stimulating situations.  treadmill exercise stress test done 05-07-2023 was abnormal, reporting exercise capacity of 6 minutes attaining 101% of MPHR, 164 bpm, test stopped due to EKG changes and left shoulder pain reported by patient, associated with horizontal ST depressions suggestive of ischemia.  Peak blood pressure was 192/52 mmHg.  ST segment depressions resolved at about 2 minutes in recovery. No ventricular ectopy or arrhythmias observed. Reviewed tracings myself.  Today she mentions that left shoulder pain she was having was going on even before the stress test and was not the reason for stopping the test.  In this setting we discussed further evaluation for any significant  underlying coronary artery disease. Also reviewed possible noncoronary causes for these findings, such as demand supply mismatch in the setting of structural changes to the heart such as left ventricular hypertrophy, anemia, deconditioning.  - Recommended further evaluation for coronary artery disease Options for doing this with coronary angiogram by invasive approach such as cardiac catheterization and noninvasive approach cardiac CT coronary angiogram was reviewed. She prefers a less invasive approach at this time. Given her symptoms are atypical and not exertional, reasonable to proceed with noninvasive approach with cardiac CT coronary angiogram.  - Will order cardiac coronary CT angiogram, will request to be done expedited, tentatively to be done at [facility] in [facility] closer to her home in [place].  She is aware of contrast use.  Discussed about premedication to avoid allergic reactions.  She has done this regimen before and has tolerated well, as recently as April 17 for her CTA chest. She understands and acknowledges potential need to proceed with cardiac catheterization if cardiac CT coronary angiogram test results are abnormal.  - Recommended to avoid any moderate to heavy exertional activities.  Limit herself to day-to-day activities. Recommended to continue taking aspirin  81 mg once daily. Recommended prescription for sublingual nitroglycerin  0.5 mg to be used as needed for any chest pain symptoms that are not subsiding with rest within a couple minutes.  She is aware to call 911 or get to the nearest ER if her symptoms are worsening.  -She was wondering about her upcoming and annual physical evaluation for her [employer].  At this time advised her to avoid any physical exercise testing prior to her cardiac  evaluation.  She requested letter to review this.  Will provide a letter stating she should avoid any moderate to heavy exertion and limit herself to day-to-day  activities.  - She wishes to continue with her travel to see her brother in [place] , plans to travel by air and is aware to avoid any strenuous activities.  This is reasonable.  - Will also obtain transthoracic echocardiogram to rule out any significant cardiac structural and functional issues.       Relevant Orders   EKG 12-Lead (Completed)   CT CORONARY MORPH W/CTA COR W/SCORE W/CA W/CM &/OR WO/CM   ECHOCARDIOGRAM COMPLETE   Abnormal ECG during exercise stress test   Relevant Orders   ECHOCARDIOGRAM COMPLETE   Return to clinic tentatively in 4 to 6 weeks.   History of Present Illness:    Pam Gomez is a 58 y.o. female who is being seen today for the evaluation of chest pain and abnormal stress test results from May 07, 2023 at the request of Araceli Knight, PA-C.   Pleasant woman here for the visit today accompanied by her husband.  She lives in [place].  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   Reports longstanding history of lupus, obesity (on treatment with zepbound ), HTN (she denies high blood pressure and notes low dose hydrochlorothiazide  12.5 mg has been on for mild ankle edema and does not want to be given the diagnosis of hypertension), GERD, CKD stage 2, abnormal PFTs in September 2022 with possible restrictive lung defect, mild sleep apnea on prior sleep study from October 2022 [AHI 11.6, mild snoring]. Reports no prior history of CAD, CHF, MI, CVA.  Reports over the past couple months atypical symptoms of left shoulder discomfort and arm discomfort and left upper back discomfort.  Symptoms are atypical and mostly appear to be present during rest or when she is emotionally stressed out or anxious.  Does not report significant symptoms during activities such as walking which she does on a routine basis.  She has not noticed any significant change in her functional capacity day-to-day at home or at  work.  Underwent further evaluation with treadmill stress test with exercise recently.  Reviewed the results as below treadmill exercise stress test done 05-07-2023 was abnormal, reporting exercise capacity of 6 minutes attaining 101% of MPHR, 164 bpm, test stopped due to EKG changes and left shoulder pain reported by patient, associated with horizontal ST depressions suggestive of ischemia.  Peak blood pressure was 192/52 mmHg.  ST segment depressions resolved at about 2 minutes in recovery. No ventricular ectopy or arrhythmias observed. Reviewed tracings myself.  Today she mentions that left shoulder pain she was having was going on even before the stress test and was not the reason for stopping the test.  Subsequently she had visits with her PCP, high-sensitivity troponin was requested but the lab test was canceled couple times. Blood work from 05-13-2023 noted D-dimer elevated 0.54 this was followed up with CT angiogram on 05-16-2023 which showed no evidence of pulmonary embolism. Minimal linear subsegmental atelectasis. Normal size aorta and heart.  She tolerated the procedure well after being premedicated with prednisone  for her IV contrast allergy. Reviewed results with her and discussed that no major abnormality was reported on this study.  Here today she mentions no significant change in her symptoms.  Does not report any significant functional limitations.  Continues to work regularly. Denies any palpitations, lightheadedness,  dizziness or syncopal episodes.  Denies any pedal edema. Denies any blood in urine or stools.  Husband mentions she does snore mildly but no significant sleep disturbances reported.  She has been losing weight with her ongoing weight management and therapy with Zepbound .  Mentions she has a visit coming up tomorrow to see her brother in [place]  and will be returning in couple days. Mentions that she also is due to get her annual physical assessment for [employer]  reserve.  Mentions good compliance with her medications. Tells me that she has been taking aspirin  81 mg for the past 3 months. She has been on atorvastatin  for the past month.  Describes hydrochlorothiazide  was started initially for blood pressures at times been slightly elevated however she describes her blood pressures to be more or less within normal limits and the reason for continued hydrochlorothiazide  use is intermittent ankle swelling.  EKG in clinic today shows sinus rhythm heart rate 81/min, PR interval normal 172 ms, QRS duration 88 ms, QTc 450 ms.  CMP from 05/06/2023 was unremarkable with normal transaminases, alkaline phosphatase, electrolytes and BUN 9 and creatinine 0.92 with EGFR 72. Recent CBC from 05/10/2023 with hemoglobin 11.9, hematocrit 34.8, platelets 484, WBC 6.4. Lipid panel from 05-06-2023, nonfasting, total cholesterol 185, HDL 36, LDL 99, triglycerides 293.  Past Medical History:  Diagnosis Date   Acute pain of left shoulder 03/22/2020   Asthma 08/26/2008   Formatting of this note might be different from the original. Formatting of this note might be different from the original. Asthma ICD-10 cut over     Cervical dysfunction 01/09/2021   Change in stool caliber 02/19/2017   Chest discomfort 03/21/2015   Chest tightness 09/06/2020   Chronic fatigue 03/21/2015   CKD (chronic kidney disease), 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   Class 1 obesity due to excess calories without serious comorbidity with body mass index (BMI) of 30.0 to 30.9 in adult 12/10/2016   Disc disease, degenerative, cervical 09/07/2020   Drug-induced constipation 09/25/2022   Dyslipidemia (high LDL; low HDL) 01/08/2022   EKG abnormalities 01/28/2023   Elevated blood pressure reading 04/02/2022   Elevated LDL cholesterol level 03/19/2019   3.8 percent 10 year CV risk 2021.      Elevated platelet count 07/12/2015   Environmental allergies 12/10/2016   Mold, dust, grass, dust mites.   Twice a week shots.    Dr. Scannell.      Epigastric discomfort 03/29/2020   Gastroesophageal reflux disease 03/29/2020   Grief reaction 01/06/2021   H/O colonoscopy 03/29/2015   Done 03/25/15 Dr Alverta Avers at [facility]. Grade 1 internal hemorrhoids. Follow up in 10 years     History of non anemic vitamin B12 deficiency 03/21/2015   Not noted in record review back to2015 from [place] physicians     History of shingles 12/10/2016   July 2018.      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 Risk included but were not limited to the following: Infection, injury to the blood vessels, nerves, and tissues, pain, poor  wound healing, scarring, stiffness, need for further surgery,    Shortness of breath 05/07/2023   Sickle cell trait (HCC) 03/21/2015   Systemic lupus erythematosus (HCC) 03/21/2015   Tinnitus of both ears 08/29/2020   Upper back pain on left side 05/10/2023   Vitamin D  insufficiency 12/10/2016   Wheezing 09/06/2020    Past Surgical History:  Procedure Laterality Date   CESAREAN SECTION  2002   QUADRICEPS TENDON REPAIR  07/06/2000    Current Medications: Current Meds  Medication Sig   albuterol  (VENTOLIN  HFA) 108 (90 Base) MCG/ACT inhaler Inhale 2 puffs into the lungs every 6 (six) hours as needed for wheezing or shortness of breath.   aspirin  EC 81 MG tablet Take 1 tablet (81 mg total) by mouth daily. Swallow whole.   atorvastatin  (LIPITOR) 40 MG tablet Take 1 tablet (40 mg total) by mouth  daily.   cyclobenzaprine  (FLEXERIL ) 10 MG tablet Take 1 tablet (10 mg total) by mouth 3 (three) times daily as needed for muscle spasms.   cycloSPORINE (RESTASIS) 0.05 % ophthalmic emulsion Place 1 drop into both eyes 2 (two) times daily.   fexofenadine  (ALLEGRA ) 180 MG tablet Take 1 tablet (180 mg total) by mouth daily.   fluconazole (DIFLUCAN) 150 MG tablet Take 150 mg by mouth once.   fluticasone  (FLONASE ) 50 MCG/ACT nasal spray SPRAY 2 SPRAYS INTO EACH NOSTRIL EVERY DAY   hydrochlorothiazide  (HYDRODIURIL ) 12.5 MG tablet Take 1 tablet (12.5 mg total) by mouth daily.   metoprolol  tartrate (LOPRESSOR ) 100 MG tablet Take 1 tablet (100 mg total) by mouth once for 1 dose. Take 2 hours prior to your CT if your heart rate is greater than 55   nitrofurantoin, macrocrystal-monohydrate, (MACROBID) 100 MG capsule Take 100 mg by mouth 2 (two) times daily.   nitroGLYCERIN  (NITROSTAT ) 0.4 MG SL tablet Place 1 tablet (0.4 mg total) under the tongue every 5 (five) minutes as needed.   omeprazole  (PRILOSEC) 40 MG capsule Take 1 capsule (40 mg total) by mouth daily.   predniSONE  (DELTASONE ) 50 MG tablet Take one tablet at 13 hours, 7hours, and 1 hour before CTA.   predniSONE  (DELTASONE ) 50 MG tablet Prednisone  50 mg - take 13 hours prior to test Take another Prednisone  50 mg 7 hours prior to test Take another Prednisone  50 mg 1 hour prior to test Take Benadryl 50 mg 1 hour prior to test Patient must complete all four doses of above prophylactic medications. Patient will need a ride after test due to Benadryl.   tirzepatide  (ZEPBOUND ) 7.5 MG/0.5ML Pen Inject 7.5 mg into the skin once a week.   Vitamin D , Ergocalciferol , (DRISDOL ) 1.25 MG (50000 UNIT) CAPS capsule Take 1 capsule (50,000 Units total) by mouth once a week. Labs for refills     Allergies:   Iodinated contrast media and Penicillins   Social History   Socioeconomic History   Marital status: Married    Spouse name: Not on file   Number of children:  Not on file   Years of education: Not on file   Highest education level: Not on file  Occupational History   Not on file  Tobacco Use   Smoking status: Never   Smokeless tobacco: Never  Vaping Use   Vaping status: Never Used  Substance and Sexual Activity   Alcohol use: No    Alcohol/week: 0.0 standard drinks of alcohol   Drug use: No   Sexual activity: Not on file  Other Topics Concern   Not on file  Social History Narrative   Not on file   Social Drivers of Health   Financial Resource Strain: Not on file  Food Insecurity: Not on file  Transportation Needs: Not on file  Physical Activity: Not on file  Stress: Not on file  Social Connections: Unknown (06/04/2021)   [facility]   Social Network    Social Network: Not on file     Family History: The patient's family history includes Alcohol abuse in her paternal grandfather; Depression in her paternal uncle; Diabetes in her paternal grandmother and another family member; Heart attack in her paternal aunt; Hypertension in her father, paternal grandfather, paternal grandmother, and paternal uncle; Stroke in her paternal grandmother. ROS:   Please see the history of present illness.    All 14 point review of systems negative except as described per history of present illness.  EKGs/Labs/Other Studies Reviewed:    The following studies were reviewed today:   EKG:  EKG Interpretation Date/Time:  Wednesday May 22 2023 16:16:54 EDT Ventricular Rate:  81 PR Interval:  172 QRS Duration:  88 QT Interval:  388 QTC Calculation: 450 R Axis:   -11  Text Interpretation: Normal sinus rhythm Left ventricular hypertrophy Cannot rule out Septal infarct , age undetermined Abnormal ECG No previous ECGs available Confirmed by Bertha Broad reddy [id number]) on 05/22/2023 4:33:05 PM    Recent Labs: 05/06/2023: ALT 16; BUN 9; Creatinine, Ser 0.92; Potassium 3.8; Sodium 142 05/10/2023: Hemoglobin 11.9;  Platelets 484  Recent Lipid Panel    Component Value Date/Time   CHOL 185 05/06/2023 1530   TRIG 293 (H) 05/06/2023 1530   HDL 36 (L) 05/06/2023 1530   CHOLHDL 5.1 (H) 05/06/2023 1530   CHOLHDL 4.7 01/01/2022 0740   VLDL 19 07/11/2016 0934   LDLCALC 99 05/06/2023 1530   LDLCALC 165 (H) 01/01/2022 0740    Physical Exam:    VS:  BP 116/72   Pulse 81   Ht 5\' 3"  (1.6 m)   Wt 175 lb 9.6 oz (79.7 kg)   LMP 11/08/2011   SpO2 96%   BMI 31.11 kg/m     Wt Readings from Last 3 Encounters:  05/22/23 175 lb 9.6 oz (79.7 kg)  05/10/23 177 lb (80.3 kg)  05/06/23 177 lb (80.3 kg)     GENERAL:  Well nourished, well developed in no acute distress NECK: No JVD; No carotid bruits CARDIAC: RRR, S1 and S2 present, no murmurs, no rubs, no gallops CHEST:  Clear to auscultation without rales, wheezing or rhonchi  Extremities: No pitting pedal edema. Pulses bilaterally symmetric with radial 2+ and dorsalis pedis 2+ NEUROLOGIC:  Alert and oriented x 3  Medication Adjustments/Labs and Tests Ordered: Current medicines are reviewed at length with the patient today.  Concerns regarding medicines are outlined above.  Orders Placed This Encounter  Procedures   CT CORONARY MORPH W/CTA COR W/SCORE W/CA W/CM &/OR WO/CM   EKG 12-Lead   ECHOCARDIOGRAM COMPLETE   Meds ordered this encounter  Medications   nitroGLYCERIN  (NITROSTAT ) 0.4 MG SL tablet    Sig: Place 1 tablet (0.4 mg total) under the tongue every 5 (five) minutes as needed.    Dispense:  25 tablet    Refill:  6   aspirin  EC 81 MG tablet    Sig: Take 1 tablet (81 mg total) by mouth daily. Swallow whole.   metoprolol  tartrate (LOPRESSOR ) 100 MG tablet    Sig: Take 1 tablet (100 mg total) by mouth  once for 1 dose. Take 2 hours prior to your CT if your heart rate is greater than 55    Dispense:  1 tablet    Refill:  0   predniSONE  (DELTASONE ) 50 MG tablet    Sig: Prednisone  50 mg - take 13 hours prior to test Take another Prednisone  50 mg  7 hours prior to test Take another Prednisone  50 mg 1 hour prior to test Take Benadryl 50 mg 1 hour prior to test Patient must complete all four doses of above prophylactic medications. Patient will need a ride after test due to Benadryl.    Dispense:  3 tablet    Refill:  0    Signed, Parag Dorton reddy Larken Urias, MD, MPH, [facility]. 05/22/2023 10:46 PM    [facility]

## 2023-05-22 NOTE — Patient Instructions (Addendum)
 Medication Instructions:  Use nitroglycerin  1 tablet placed under the tongue at the first sign of chest pain or an angina attack. 1 tablet may be used every 5 minutes as needed, for up to 15 minutes. Do not take more than 3 tablets in 15 minutes. If pain persist call 911 or go to the nearest ED.   *If you need a refill on your cardiac medications before your next appointment, please call your pharmacy*   Lab Work:  If you have labs (blood work) drawn today and your tests are completely normal, you will receive your results only by: MyChart Message (if you have MyChart) OR A paper copy in the mail If you have any lab test that is abnormal or we need to change your treatment, we will call you to review the results.   Testing/Procedures:     Please follow these instructions carefully (unless otherwise directed):  Hold all erectile dysfunction medications at least 3 days (72 hrs) prior to test.  On the Night Before the Test: Be sure to Drink plenty of water. Do not consume any caffeinated/decaffeinated beverages or chocolate 12 hours prior to your test. Do not take any antihistamines 12 hours prior to your test. If the patient has contrast allergy: Patient will need a prescription for Prednisone  and very clear instructions (as follows): Prednisone  50 mg - take 13 hours prior to test Take another Prednisone  50 mg 7 hours prior to test Take another Prednisone  50 mg 1 hour prior to test Take Benadryl 50 mg 1 hour prior to test Patient must complete all four doses of above prophylactic medications. Patient will need a ride after test due to Benadryl.  On the Day of the Test: Drink plenty of water until 1 hour prior to the test. Do not eat any food 4 hours prior to the test. You may take your regular medications prior to the test.  Take metoprolol  (Lopressor ) two hours prior to test. HOLD Hydrochlorothiazide  morning of the test. FEMALES- please wear underwire-free bra if available,  avoid dresses & tight clothing        After the Test: Drink plenty of water. After receiving IV contrast, you may experience a mild flushed feeling. This is normal. On occasion, you may experience a mild rash up to 24 hours after the test. This is not dangerous. If this occurs, you can take Benadryl 25 mg and increase your fluid intake. If you experience trouble breathing, this can be serious. If it is severe call 911 IMMEDIATELY. If it is mild, please call our office. If you take any of these medications: Glipizide/Metformin, Avandament, Glucavance, please do not take 48 hours after completing test unless otherwise instructed.  We will call to schedule your test 2-4 weeks out understanding that some insurance companies will need an authorization prior to the service being performed.   For non-scheduling related questions, please contact the cardiac imaging nurse navigator should you have any questions/concerns: Jinger Mount, Cardiac Imaging Nurse Navigator Chase Copping, Cardiac Imaging Nurse Navigator [facility] Direct Office Dial: [phone]   For scheduling needs, including cancellations and rescheduling, please call [place], [phone].    Follow-Up: At [facility], you and your health needs are our priority.  As part of our continuing mission to provide you with exceptional heart care, we have created designated Provider Care Teams.  These Care Teams include your primary Cardiologist (physician) and Advanced Practice Providers (APPs -  Physician Assistants and Nurse Practitioners) who all work  together to provide you with the care you need, when you need it.  We recommend signing up for the patient portal called "MyChart".  Sign up information is provided on this After Visit Summary.  MyChart is used to connect with patients for Virtual Visits (Telemedicine).  Patients are able to view lab/test results, encounter notes, upcoming appointments, etc.   Non-urgent messages can be sent to your provider as well.   To learn more about what you can do with MyChart, go to [email or web address].    Your next appointment:     Other Instructions Cardiac CT Angiogram A cardiac CT angiogram is a procedure to look at the heart and the area around the heart. It may be done to help find the cause of chest pains or other symptoms of heart disease. During this procedure, a substance called contrast dye is injected into the blood vessels in the area to be checked. A large X-ray machine, called a CT scanner, then takes detailed pictures of the heart and the surrounding area. The procedure is also sometimes called a coronary CT angiogram, coronary artery scanning, or CTA. A cardiac CT angiogram allows the health care provider to see how well blood is flowing to and from the heart. The health care provider will be able to see if there are any problems, such as: Blockage or narrowing of the coronary arteries in the heart. Fluid around the heart. Signs of weakness or disease in the muscles, valves, and tissues of the heart. Tell a health care provider about: Any allergies you have. This is especially important if you have had a previous allergic reaction to contrast dye. All medicines you are taking, including vitamins, herbs, eye drops, creams, and over-the-counter medicines. Any blood disorders you have. Any surgeries you have had. Any medical conditions you have. Whether you are pregnant or may be pregnant. Any anxiety disorders, chronic pain, or other conditions you have that may increase your stress or prevent you from lying still. What are the risks? Generally, this is a safe procedure. However, problems may occur, including: Bleeding. Infection. Allergic reactions to medicines or dyes. Damage to other structures or organs. Kidney damage from the contrast dye that is used. Increased risk of cancer from radiation exposure. This risk is low. Talk with  your health care provider about: The risks and benefits of testing. How you can receive the lowest dose of radiation. What happens before the procedure? Wear comfortable clothing and remove any jewelry, glasses, dentures, and hearing aids. Follow instructions from your health care provider about eating and drinking. This may include: For 12 hours before the procedure -- avoid caffeine. This includes tea, coffee, soda, energy drinks, and diet pills. Drink plenty of water or other fluids that do not have caffeine in them. Being well hydrated can prevent complications. For 4-6 hours before the procedure -- stop eating and drinking. The contrast dye can cause nausea, but this is less likely if your stomach is empty. Ask your health care provider about changing or stopping your regular medicines. This is especially important if you are taking diabetes medicines, blood thinners, or medicines to treat problems with erections (erectile dysfunction). What happens during the procedure?  Hair on your chest may need to be removed so that small sticky patches called electrodes can be placed on your chest. These will transmit information that helps to monitor your heart during the procedure. An IV will be inserted into one of your veins. You might be given a medicine to  control your heart rate during the procedure. This will help to ensure that good images are obtained. You will be asked to lie on an exam table. This table will slide in and out of the CT machine during the procedure. Contrast dye will be injected into the IV. You might feel warm, or you may get a metallic taste in your mouth. You will be given a medicine called nitroglycerin . This will relax or dilate the arteries in your heart. The table that you are lying on will move into the CT machine tunnel for the scan. The person running the machine will give you instructions while the scans are being done. You may be asked to: Keep your arms above your  head. Hold your breath. Stay very still, even if the table is moving. When the scanning is complete, you will be moved out of the machine. The IV will be removed. The procedure may vary among health care providers and hospitals. What can I expect after the procedure? After your procedure, it is common to have: A metallic taste in your mouth from the contrast dye. A feeling of warmth. A headache from the nitroglycerin . Follow these instructions at home: Take over-the-counter and prescription medicines only as told by your health care provider. If you are told, drink enough fluid to keep your urine pale yellow. This will help to flush the contrast dye out of your body. Most people can return to their normal activities right after the procedure. Ask your health care provider what activities are safe for you. It is up to you to get the results of your procedure. Ask your health care provider, or the department that is doing the procedure, when your results will be ready. Keep all follow-up visits as told by your health care provider. This is important. Contact a health care provider if: You have any symptoms of allergy to the contrast dye. These include: Shortness of breath. Rash or hives. A racing heartbeat. Summary A cardiac CT angiogram is a procedure to look at the heart and the area around the heart. It may be done to help find the cause of chest pains or other symptoms of heart disease. During this procedure, a large X-ray machine, called a CT scanner, takes detailed pictures of the heart and the surrounding area after a contrast dye has been injected into blood vessels in the area. Ask your health care provider about changing or stopping your regular medicines before the procedure. This is especially important if you are taking diabetes medicines, blood thinners, or medicines to treat erectile dysfunction. If you are told, drink enough fluid to keep your urine pale yellow. This will help  to flush the contrast dye out of your body. This information is not intended to replace advice given to you by your health care provider. Make sure you discuss any questions you have with your health care provider. Document Revised: 09/10/2018 Document Reviewed: 09/10/2018 Elsevier Patient Education  2020 [employer].

## 2023-05-22 NOTE — Assessment & Plan Note (Addendum)
 Atypical musculoskeletal sounding shoulder and upper back discomfort.  No typical chest pain features to suggest angina. Good functional capacity. Symptoms that she describes occurring intermittently for the past few months appear to occur with stressful situation at her work or or other emotionally stimulating situations.  treadmill exercise stress test done 05-07-2023 was abnormal, reporting exercise capacity of 6 minutes attaining 101% of MPHR, 164 bpm, test stopped due to EKG changes and left shoulder pain reported by patient, associated with horizontal ST depressions suggestive of ischemia.  Peak blood pressure was 192/52 mmHg.  ST segment depressions resolved at about 2 minutes in recovery. No ventricular ectopy or arrhythmias observed. Reviewed tracings myself.  Today she mentions that left shoulder pain she was having was going on even before the stress test and was not the reason for stopping the test.  In this setting we discussed further evaluation for any significant underlying coronary artery disease. Also reviewed possible noncoronary causes for these findings, such as demand supply mismatch in the setting of structural changes to the heart such as left ventricular hypertrophy, anemia, deconditioning.  - Recommended further evaluation for coronary artery disease Options for doing this with coronary angiogram by invasive approach such as cardiac catheterization and noninvasive approach cardiac CT coronary angiogram was reviewed. She prefers a less invasive approach at this time. Given her symptoms are atypical and not exertional, reasonable to proceed with noninvasive approach with cardiac CT coronary angiogram.  - Will order cardiac coronary CT angiogram, will request to be done expedited, tentatively to be done at [facility] in [facility] closer to her home in [place].  She is aware of contrast use.  Discussed about premedication to avoid allergic reactions.  She has done  this regimen before and has tolerated well, as recently as April 17 for her CTA chest. She understands and acknowledges potential need to proceed with cardiac catheterization if cardiac CT coronary angiogram test results are abnormal.  - Recommended to avoid any moderate to heavy exertional activities.  Limit herself to day-to-day activities. Recommended to continue taking aspirin  81 mg once daily. Recommended prescription for sublingual nitroglycerin  0.5 mg to be used as needed for any chest pain symptoms that are not subsiding with rest within a couple minutes.  She is aware to call 911 or get to the nearest ER if her symptoms are worsening.  -She was wondering about her upcoming and annual physical evaluation for her [employer].  At this time advised her to avoid any physical exercise testing prior to her cardiac evaluation.  She requested letter to review this.  Will provide a letter stating she should avoid any moderate to heavy exertion and limit herself to day-to-day activities.  - She wishes to continue with her travel to see her brother in [place] , plans to travel by air and is aware to avoid any strenuous activities.  This is reasonable.  - Will also obtain transthoracic echocardiogram to rule out any significant cardiac structural and functional issues.

## 2023-05-28 ENCOUNTER — Other Ambulatory Visit: Payer: Self-pay | Admitting: Physician Assistant

## 2023-05-29 ENCOUNTER — Other Ambulatory Visit: Payer: Self-pay | Admitting: Physician Assistant

## 2023-05-29 NOTE — Telephone Encounter (Signed)
 Copied from CRM [id number]. Topic: Clinical - Medication Refill >> May 29, 2023  3:29 PM Tiffany H wrote: Most Recent Primary Care Visit:  Provider: Araceli Knight  Department: [facility]  Visit Type: MYCHART VIDEO VISIT  Date: 05/10/2023  Medication: tirzepatide  (ZEPBOUND ) 7.5 MG/0.5ML Pen [[id number]]  Patient's injection day is Monday. Patient doesn't have another pen for this coming Monday. Please refill before weekend.   Has the patient contacted their pharmacy? Yes (Agent: If no, request that the patient contact the pharmacy for the refill. If patient does not wish to contact the pharmacy document the reason why and proceed with request.) (Agent: If yes, when and what did the pharmacy advise?)  Is this the correct pharmacy for this prescription? Yes If no, delete pharmacy and type the correct one.  This is the patient's preferred pharmacy:   [phone] - [place], [place] - [address] [place] [address] Phone: [phone] Fax: [phone]   Has the prescription been filled recently? No  Is the patient out of the medication? Yes  Has the patient been seen for an appointment in the last year OR does the patient have an upcoming appointment? Yes  Can we respond through MyChart? Yes  Agent: Please be advised that Rx refills may take up to 3 business days. We ask that you follow-up with your pharmacy.

## 2023-06-04 ENCOUNTER — Ambulatory Visit: Admitting: Cardiology

## 2023-06-07 ENCOUNTER — Telehealth (HOSPITAL_COMMUNITY): Payer: Self-pay | Admitting: Emergency Medicine

## 2023-06-07 NOTE — Telephone Encounter (Signed)
 Reaching out to patient to offer assistance regarding upcoming cardiac imaging study; pt verbalizes understanding of appt date/time, parking situation and where to check in, pre-test NPO status and medications ordered, and verified current allergies; name and call back number provided for further questions should they arise Jinger Mount RN Navigator Cardiac Imaging [facility] [phone] office [phone] cell  13 hr prep explained to patient - verbalized understanding

## 2023-06-10 ENCOUNTER — Ambulatory Visit (HOSPITAL_COMMUNITY)
Admission: RE | Admit: 2023-06-10 | Discharge: 2023-06-10 | Disposition: A | Discharge status: Home / Self Care | Source: Ambulatory Visit

## 2023-06-10 DIAGNOSIS — R0789 Other chest pain: Secondary | ICD-10-CM | POA: Diagnosis present

## 2023-06-10 MED ORDER — METOPROLOL TARTRATE 5 MG/5ML IV SOLN
10.0000 mg | Freq: Once | INTRAVENOUS | Status: AC | PRN
  Administered 2023-06-10: 5 mg via INTRAVENOUS

## 2023-06-10 MED ORDER — METOPROLOL TARTRATE 5 MG/5ML IV SOLN
INTRAVENOUS | Status: AC
  Filled 2023-06-10: qty 5

## 2023-06-10 MED ORDER — IOHEXOL 350 MG/ML SOLN
100.0000 mL | Freq: Once | INTRAVENOUS | Status: AC | PRN
  Administered 2023-06-10: 100 mL via INTRAVENOUS

## 2023-06-10 MED ORDER — DILTIAZEM HCL 25 MG/5ML IV SOLN: 10.0000 mg | INTRAVENOUS | Status: DC | PRN

## 2023-06-10 MED ORDER — NITROGLYCERIN 0.4 MG SL SUBL
0.8000 mg | SUBLINGUAL_TABLET | Freq: Once | SUBLINGUAL | Status: AC
Start: 2023-06-10 — End: 2023-06-10
  Administered 2023-06-10: 0.8 mg via SUBLINGUAL

## 2023-06-10 NOTE — OR Nursing (Signed)
 Iv removed, pt denies complaints

## 2023-06-11 ENCOUNTER — Ambulatory Visit: Payer: Self-pay

## 2023-06-26 ENCOUNTER — Ambulatory Visit (HOSPITAL_COMMUNITY)
Admission: RE | Admit: 2023-06-26 | Discharge: 2023-06-26 | Disposition: A | Discharge status: Home / Self Care | Source: Ambulatory Visit | Attending: Cardiovascular Disease | Admitting: Cardiovascular Disease

## 2023-06-26 ENCOUNTER — Telehealth: Payer: Self-pay | Admitting: Cardiology

## 2023-06-26 DIAGNOSIS — R0789 Other chest pain: Secondary | ICD-10-CM | POA: Diagnosis present

## 2023-06-26 DIAGNOSIS — R9431 Abnormal electrocardiogram [ECG] [EKG]: Secondary | ICD-10-CM | POA: Diagnosis present

## 2023-06-26 LAB — ECHOCARDIOGRAM COMPLETE
Area-P 1/2: 3.39 cm2
S' Lateral: 2.6 cm

## 2023-06-26 NOTE — Telephone Encounter (Signed)
 Called and left VM for patient to call back about her experience in our [facility] location. I asked that she call me back at [phone] to discuss what happened and how we can make this better.

## 2023-06-30 ENCOUNTER — Other Ambulatory Visit: Payer: Self-pay | Admitting: Physician Assistant

## 2023-07-01 ENCOUNTER — Other Ambulatory Visit: Payer: Self-pay | Admitting: Physician Assistant

## 2023-07-01 ENCOUNTER — Telehealth: Payer: Self-pay

## 2023-07-01 NOTE — Telephone Encounter (Signed)
 Copied from CRM [id number]. Topic: Clinical - Medication Refill >> Jul 01, 2023  9:09 AM Bearl Botts A wrote: Medication: tirzepatide  (ZEPBOUND ) 7.5 MG/0.5ML Pen  Has the patient contacted their pharmacy? Yes Patient states that every time she goes to get the refill the pharmacy is stating that she is needing a new prescription.  This is the patient's preferred pharmacy:  [phone] - [place], [place] - [address] [place] [address] Phone: [phone] Fax: [phone]  Is this the correct pharmacy for this prescription? Yes If no, delete pharmacy and type the correct one.   Has the prescription been filled recently? No  Is the patient out of the medication? Yes  Has the patient been seen for an appointment in the last year OR does the patient have an upcoming appointment? Yes  Can we respond through MyChart? No. Patient would rather have a call back. She is needing to know what she need to do going forward. So that this not happen every time its time for her refill.   Agent: Please be advised that Rx refills may take up to 3 business days. We ask that you follow-up with your pharmacy.

## 2023-07-01 NOTE — Telephone Encounter (Signed)
 Attempted to call patient to inform her the medication has been sent to her preferred pharmacy appropriately.  No response noted.  Detailed VM left, per request of patient.      Copied from CRM [id number]. Topic: Clinical - Medication Refill >> Jul 01, 2023  3:36 PM Retta Caster wrote: Medication: tirzepatide  (ZEPBOUND ) 7.5 MG/0.5ML Pen-Patient requesting amount of refills. Needs call back. Is out of medication. [phone]

## 2023-07-03 ENCOUNTER — Other Ambulatory Visit: Payer: Self-pay

## 2023-07-04 ENCOUNTER — Ambulatory Visit

## 2023-07-04 VITALS — BP 110/62 | HR 70 | Ht 63.0 in | Wt 176.1 lb

## 2023-07-04 DIAGNOSIS — R030 Elevated blood-pressure reading, without diagnosis of hypertension: Secondary | ICD-10-CM

## 2023-07-04 DIAGNOSIS — R9431 Abnormal electrocardiogram [ECG] [EKG]: Secondary | ICD-10-CM

## 2023-07-04 NOTE — Progress Notes (Signed)
 Cardiology Consultation:    Date:  07/04/2023   ID:  LOVELLE Gomez, DOB 12-25-1965, MRN [id number]  PCP:  Araceli Knight, PA-C  Cardiologist:  Angelena Kells, MD   Referring MD: Araceli Knight, PA-C   No chief complaint on file.    ASSESSMENT AND PLAN:   Pam Gomez 58 year old woman with history of lupus [no recent flareup and not on any immunosuppressant therapy], obesity, elevated blood pressures intermittently but not on any regular therapy with antihypertensives, GERD, CKD stage II, mild sleep apnea on sleep study from October 2022, abnormal PFTs in September 2022 with possible restrictive lung disease. With atypical chest pain treadmill stress test April 2025 was abnormal. Subsequently followed up with cardiac CT coronary angiogram and transthoracic echocardiogram both of which was reassuring without any major abnormalities.  Problem List Items Addressed This Visit     Elevated blood pressure reading without diagnosis of hypertension - Primary   Elevated blood pressure at times without diagnosis of hypertension. Currently not on any medications intermittently used hydrochlorothiazide  for any swelling in the lower extremities but has not been using it over the couple weeks. Blood pressures remain well-controlled at this time.  She will continue to monitor blood pressures at home at least couple times a week and if blood pressures are consistently above 130/80 mmHg she will inform us  or her PCP.      Abnormal ECG during exercise stress test   Abnormal EKG stress test from April 2025 at 101% [facility] after 6 minutes of exercise with quick normalization of EKG in recovery likely related to demand supply mismatch without any obstructive coronary disease as evidenced on cardiac CT imaging from 09/10/2023 with CAD RADS 0 study and calcium  score 0.  Overall reassuring. Advised her to continue with risk factor reduction dietary and lifestyle modifications. Advise regular  exercise and gradually building up her exercise capacity and functional tolerance.  Okay to discontinue aspirin  and statin at this time given lack of any significant coronary atherosclerosis.        Return to clinic for follow-up with us  on an as-needed basis.   History of Present Illness:    Pam Gomez is a 58 y.o. female who is being seen today for follow-up visit PCP is Breeback, Jade L, PA-C. Last visit with me in the office was 05-22-2023.   She lives in [place].  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  Here for the visit today by herself  Has history of lupus [no recent flareup and not on any immunosuppressant therapy], obesity, elevated blood pressures intermittently but not on any regular therapy with antihypertensives, GERD, CKD stage II, mild sleep apnea on sleep study from October 2022, abnormal PFTs in September 2022 with possible restrictive lung disease. With atypical symptoms of shoulder and upper back pain subsequently evaluated with treadmill stress test which was abnormal.  Mildly elevated D-dimer evaluated with CTA chest from April 2025 noted no PE.  Treadmill exercise stress test from 05/07/2023 attain 101% [facility] after 6 minutes exercise attaining 164 bpm, ST segment depressions resolved 2 minutes into recovery.  Further evaluated with cardiac CT coronary angiogram 06-10-2023 noted calcium  score 0, CAD RADS 0 study, normal caliber aorta with atherosclerosis.  No significant extracardiac findings.  Echocardiogram results from 06-26-2023 noted normal biventricular function LVEF 60 to 65% with grade 1 diastolic dysfunction, no significant valve abnormalities.  Overall she reports doing well.  She has been able to manage her stress better. Blood pressures at home have been well-controlled and she has not been taking any hydrochlorothiazide  at this time.    Past Medical History:  Diagnosis Date   Abnormal  ECG during exercise stress test 05/10/2023   Acute pain of left shoulder 03/22/2020   Asthma 08/26/2008   Formatting of this note might be different from the original. Formatting of this note might be different from the original. Asthma ICD-10 cut over     Cervical dysfunction 01/09/2021   Change in stool caliber 02/19/2017   Chest discomfort 03/21/2015   Chest tightness 09/06/2020   Chronic fatigue 03/21/2015   CKD (chronic kidney disease), 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   Class 1 obesity due to excess calories without serious comorbidity with body mass index (BMI) of 30.0 to 30.9 in adult 12/10/2016   Disc disease, degenerative, cervical 09/07/2020   Drug-induced constipation 09/25/2022   Dyslipidemia (high LDL; low HDL) 01/08/2022   EKG abnormalities 01/28/2023   Elevated blood pressure reading 04/02/2022   Elevated LDL cholesterol level 03/19/2019   3.8 percent 10 year CV risk 2021.      Elevated platelet count 07/12/2015   Environmental allergies 12/10/2016   Mold, dust, grass, dust mites.   Twice a week shots.   Dr. Scannell.      Epigastric discomfort 03/29/2020   Gastroesophageal reflux disease 03/29/2020   Grief reaction 01/06/2021   H/O colonoscopy 03/29/2015   Done 03/25/15 Dr Alverta Avers at [facility]. Grade 1 internal hemorrhoids. Follow up in 10 years     History of non anemic vitamin B12 deficiency 03/21/2015   Not noted in record review back to2015 from [facility]     History of shingles 12/10/2016   July 2018.      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 Risk included but were not limited to the following: Infection, injury to the blood vessels, nerves, and tissues, pain, poor wound healing, scarring, stiffness, need for further surgery,    Shortness of breath 05/07/2023   Sickle cell trait (HCC) 03/21/2015   Systemic lupus erythematosus (HCC) 03/21/2015   Tinnitus of both ears 08/29/2020   Upper back pain on left side 05/10/2023   Vitamin D  insufficiency 12/10/2016   Wheezing 09/06/2020    Past Surgical History:  Procedure Laterality Date   CESAREAN SECTION  2002   QUADRICEPS TENDON REPAIR  07/06/2000    Current Medications: Current Meds  Medication Sig   albuterol  (VENTOLIN  HFA) 108 (90 Base) MCG/ACT inhaler Inhale 2 puffs into the lungs every 6 (six) hours as needed for wheezing or shortness of breath.   cycloSPORINE (RESTASIS) 0.05 % ophthalmic emulsion Place 1 drop into both eyes 2 (two) times daily.   fexofenadine  (ALLEGRA ) 180 MG tablet Take 1 tablet (180 mg total) by mouth daily.   fluticasone  (FLONASE ) 50 MCG/ACT nasal spray SPRAY 2 SPRAYS INTO EACH NOSTRIL EVERY DAY   hydrochlorothiazide  (HYDRODIURIL ) 12.5 MG tablet Take 1 tablet (12.5 mg total) by mouth daily.  nitroGLYCERIN  (NITROSTAT ) 0.4 MG SL tablet Place 0.4 mg under the tongue every 5 (five) minutes as needed for chest pain.   omeprazole  (PRILOSEC) 40 MG capsule Take 1 capsule (40 mg total) by mouth daily.   tirzepatide  (ZEPBOUND ) 7.5 MG/0.5ML Pen INJECT 7.5 MG SUBCUTANEOUSLY WEEKLY   Vitamin D , Ergocalciferol , (DRISDOL ) 1.25 MG (50000 UNIT) CAPS capsule Take 1 capsule (50,000 Units total) by mouth once a week. Labs for refills     Allergies:   Iodinated contrast media and Penicillins   Social History   Socioeconomic History   Marital status: Married    Spouse name: Not on file   Number of children: Not on file   Years of education: Not on file   Highest education  level: Not on file  Occupational History   Not on file  Tobacco Use   Smoking status: Never   Smokeless tobacco: Never  Vaping Use   Vaping status: Never Used  Substance and Sexual Activity   Alcohol use: No    Alcohol/week: 0.0 standard drinks of alcohol   Drug use: No   Sexual activity: Not on file  Other Topics Concern   Not on file  Social History Narrative   Not on file   Social Drivers of Health   Financial Resource Strain: Not on file  Food Insecurity: Not on file  Transportation Needs: Not on file  Physical Activity: Not on file  Stress: Not on file  Social Connections: Unknown (06/04/2021)   [facility]   Social Network    Social Network: Not on file     Family History: The patient's family history includes Alcohol abuse in her paternal grandfather; Depression in her paternal uncle; Diabetes in her paternal grandmother and another family member; Heart attack in her paternal aunt; Hypertension in her father, paternal grandfather, paternal grandmother, and paternal uncle; Stroke in her paternal grandmother. ROS:   Please see the history of present illness.    All 14 point review of systems negative except as described per history of present illness.  EKGs/Labs/Other Studies Reviewed:    The following studies were reviewed today:   EKG:       Recent Labs: 05/06/2023: ALT 16; BUN 9; Creatinine, Ser 0.92; Potassium 3.8; Sodium 142 05/10/2023: Hemoglobin 11.9; Platelets 484  Recent Lipid Panel    Component Value Date/Time   CHOL 185 05/06/2023 1530   TRIG 293 (H) 05/06/2023 1530   HDL 36 (L) 05/06/2023 1530   CHOLHDL 5.1 (H) 05/06/2023 1530   CHOLHDL 4.7 01/01/2022 0740   VLDL 19 07/11/2016 0934   LDLCALC 99 05/06/2023 1530   LDLCALC 165 (H) 01/01/2022 0740    Physical Exam:    VS:  BP 110/62   Pulse 70   Ht 5\' 3"  (1.6 m)   Wt 176 lb 1.3 oz (79.9 kg)   LMP 11/08/2011   SpO2 98%   BMI 31.19 kg/m     Wt Readings from  Last 3 Encounters:  07/04/23 176 lb 1.3 oz (79.9 kg)  05/22/23 175 lb 9.6 oz (79.7 kg)  05/10/23 177 lb (80.3 kg)     GENERAL:  Well nourished, well developed in no acute distress Extremities: No pitting pedal edema. Pulses bilaterally symmetric with  dorsalis pedis 2+ NEUROLOGIC:  Alert and oriented x 3  Medication Adjustments/Labs and Tests Ordered: Current medicines are reviewed at length with the patient today.  Concerns regarding medicines are outlined above.  No orders of the defined types  were placed in this encounter.  No orders of the defined types were placed in this encounter.   Signed, Lura Sallies, MD, MPH, [facility]. 07/04/2023 4:53 PM    [facility]

## 2023-07-04 NOTE — Assessment & Plan Note (Signed)
 Elevated blood pressure at times without diagnosis of hypertension. Currently not on any medications intermittently used hydrochlorothiazide  for any swelling in the lower extremities but has not been using it over the couple weeks. Blood pressures remain well-controlled at this time.  She will continue to monitor blood pressures at home at least couple times a week and if blood pressures are consistently above 130/80 mmHg she will inform us  or her PCP.

## 2023-07-04 NOTE — Assessment & Plan Note (Signed)
 Abnormal EKG stress test from April 2025 at 101% [facility] after 6 minutes of exercise with quick normalization of EKG in recovery likely related to demand supply mismatch without any obstructive coronary disease as evidenced on cardiac CT imaging from 09/10/2023 with CAD RADS 0 study and calcium  score 0.  Overall reassuring. Advised her to continue with risk factor reduction dietary and lifestyle modifications. Advise regular exercise and gradually building up her exercise capacity and functional tolerance.  Okay to discontinue aspirin  and statin at this time given lack of any significant coronary atherosclerosis.

## 2023-07-04 NOTE — Patient Instructions (Signed)
 Medication Instructions:  Your physician recommends that you continue on your current medications as directed. Please refer to the Current Medication list given to you today.  *If you need a refill on your cardiac medications before your next appointment, please call your pharmacy*  Lab Work: None If you have labs (blood work) drawn today and your tests are completely normal, you will receive your results only by: MyChart Message (if you have MyChart) OR A paper copy in the mail If you have any lab test that is abnormal or we need to change your treatment, we will call you to review the results.  Testing/Procedures: None  Follow-Up: At [facility], you and your health needs are our priority.  As part of our continuing mission to provide you with exceptional heart care, our providers are all part of one team.  This team includes your primary Cardiologist (physician) and Advanced Practice Providers or APPs (Physician Assistants and Nurse Practitioners) who all work together to provide you with the care you need, when you need it.  Your next appointment:   Follow up as needed  Provider:   Huntley Dec, MD    We recommend signing up for the patient portal called "MyChart".  Sign up information is provided on this After Visit Summary.  MyChart is used to connect with patients for Virtual Visits (Telemedicine).  Patients are able to view lab/test results, encounter notes, upcoming appointments, etc.  Non-urgent messages can be sent to your provider as well.   To learn more about what you can do with MyChart, go to [email or web address].   Other Instructions None

## 2023-07-10 ENCOUNTER — Other Ambulatory Visit: Payer: Self-pay | Admitting: Obstetrics and Gynecology

## 2023-07-10 DIAGNOSIS — Z13820 Encounter for screening for osteoporosis: Secondary | ICD-10-CM

## 2023-07-12 LAB — D-DIMER, QUANTITATIVE: D-DIMER: 0.54 ug{FEU}/mL (ref <=0.56)

## 2023-07-22 ENCOUNTER — Other Ambulatory Visit: Payer: Self-pay | Admitting: Physician Assistant

## 2023-07-22 MED ORDER — ZEPBOUND 10 MG/0.5ML ~~LOC~~ SOAJ
10.0000 mg | SUBCUTANEOUS | 1 refills | Status: DC
Start: 2023-07-22 — End: 2023-09-03

## 2023-07-22 NOTE — Telephone Encounter (Signed)
 Copied from CRM [id number]. Topic: Clinical - Medication Refill >> Jul 22, 2023  1:51 PM Vivian Z wrote: Medication: tirzepatide  (ZEPBOUND ) 7.5 MG/0.5ML Pen  Has the patient contacted their pharmacy? Yes (Agent: If no, request that the patient contact the pharmacy for the refill. If patient does not wish to contact the pharmacy document the reason why and proceed with request.) (Agent: If yes, when and what did the pharmacy advise?)  This is the patient's preferred pharmacy:  [phone] [place], [place] - [address] [place] [address] Phone: [phone] Fax: [phone]  Is this the correct pharmacy for this prescription? Yes If no, delete pharmacy and type the correct one.   Has the prescription been filled recently? No  Is the patient out of the medication? No  Has the patient been seen for an appointment in the last year OR does the patient have an upcoming appointment? Yes  Can we respond through MyChart? Yes  Agent: Please be advised that Rx refills may take up to 3 business days. We ask that you follow-up with your pharmacy.

## 2023-07-26 ENCOUNTER — Other Ambulatory Visit: Payer: Self-pay | Admitting: Physician Assistant

## 2023-07-26 DIAGNOSIS — E559 Vitamin D deficiency, unspecified: Secondary | ICD-10-CM

## 2023-08-12 ENCOUNTER — Ambulatory Visit: Admitting: Physician Assistant

## 2023-08-14 ENCOUNTER — Ambulatory Visit: Admitting: Physician Assistant

## 2023-08-14 DIAGNOSIS — R079 Chest pain, unspecified: Secondary | ICD-10-CM

## 2023-08-14 DIAGNOSIS — R7989 Other specified abnormal findings of blood chemistry: Secondary | ICD-10-CM

## 2023-08-21 ENCOUNTER — Telehealth: Payer: Self-pay

## 2023-08-21 ENCOUNTER — Ambulatory Visit

## 2023-08-21 DIAGNOSIS — Z13820 Encounter for screening for osteoporosis: Secondary | ICD-10-CM | POA: Diagnosis not present

## 2023-08-21 NOTE — Telephone Encounter (Signed)
 Copied from CRM [id number]. Topic: Clinical - Medication Question >> Aug 21, 2023 10:01 AM Laurier C wrote: Reason for CRM: Patient would like a call back at [phone] to get clarification on the following medication: tirzepatide  (ZEPBOUND ) 10 MG/0.5ML Pen

## 2023-08-21 NOTE — Telephone Encounter (Signed)
 Pam Gomez is very upset that the Zepbound  was increased without a call to tell her is has been increased. I thought she was having an increase each month so I did send in the next dose. I didn't not realize she was not triturating up each month. She would like to speak with the office manager about a lack of communication.     I confused her with another patient that was requesting a increase.

## 2023-09-03 ENCOUNTER — Ambulatory Visit: Admitting: Physician Assistant

## 2023-09-03 ENCOUNTER — Encounter: Payer: Self-pay | Admitting: Physician Assistant

## 2023-09-03 VITALS — BP 148/78 | HR 77 | Ht 63.0 in | Wt 170.0 lb

## 2023-09-03 DIAGNOSIS — K219 Gastro-esophageal reflux disease without esophagitis: Secondary | ICD-10-CM

## 2023-09-03 DIAGNOSIS — R600 Localized edema: Secondary | ICD-10-CM

## 2023-09-03 DIAGNOSIS — E559 Vitamin D deficiency, unspecified: Secondary | ICD-10-CM | POA: Insufficient documentation

## 2023-09-03 DIAGNOSIS — I10 Essential (primary) hypertension: Secondary | ICD-10-CM | POA: Insufficient documentation

## 2023-09-03 DIAGNOSIS — Z23 Encounter for immunization: Secondary | ICD-10-CM | POA: Diagnosis not present

## 2023-09-03 DIAGNOSIS — E785 Hyperlipidemia, unspecified: Secondary | ICD-10-CM | POA: Diagnosis not present

## 2023-09-03 DIAGNOSIS — E66811 Obesity, class 1: Secondary | ICD-10-CM

## 2023-09-03 DIAGNOSIS — E6609 Other obesity due to excess calories: Secondary | ICD-10-CM

## 2023-09-03 DIAGNOSIS — E538 Deficiency of other specified B group vitamins: Secondary | ICD-10-CM | POA: Insufficient documentation

## 2023-09-03 DIAGNOSIS — B353 Tinea pedis: Secondary | ICD-10-CM

## 2023-09-03 DIAGNOSIS — R9431 Abnormal electrocardiogram [ECG] [EKG]: Secondary | ICD-10-CM

## 2023-09-03 DIAGNOSIS — Z6830 Body mass index (BMI) 30.0-30.9, adult: Secondary | ICD-10-CM

## 2023-09-03 DIAGNOSIS — R1013 Epigastric pain: Secondary | ICD-10-CM

## 2023-09-03 MED ORDER — HYDROCHLOROTHIAZIDE 12.5 MG PO TABS
12.5000 mg | ORAL_TABLET | Freq: Every day | ORAL | 1 refills | Status: AC
Start: 2023-09-03 — End: ?

## 2023-09-03 MED ORDER — KETOCONAZOLE 2 % EX CREA: 1.0000 | TOPICAL_CREAM | Freq: Two times a day (BID) | CUTANEOUS | 1 refills | Status: AC

## 2023-09-03 MED ORDER — ZEPBOUND 10 MG/0.5ML ~~LOC~~ SOAJ
10.0000 mg | SUBCUTANEOUS | 0 refills | Status: DC
Start: 2023-09-03 — End: 2023-11-18

## 2023-09-03 NOTE — Progress Notes (Unsigned)
 Established Patient Office Visit  Subjective   Patient ID: Pam Gomez, female    DOB: 1966-01-05  Age: 58 y.o. MRN: [id number]  Chief Complaint  Patient presents with   Medical Management of Chronic Issues    HPI Pt is a 58 yo obese female with HTN, dyslipidemia who presents to the clinic for follow up.   Pt had abnormal EKG during exercise stress test and was sent to cardiology. She had a complete cardiology work up with echo and CTA with no blockages or concerns. She was taken off statin, ASA, and hydrochlorothiazide . She still takes the HcTZ because it seems to help with her lower leg edema. She denies any CP, SOB, headaches or vision changes.   She is still taking zepbound  10mg  her first shot this week and doing well. No side effects. She is exercising now and eating healthy with protein goals. She is feeling really good. She has lost another 6lbs in last month. She is down about 35lbs total.   She does have some bilateral toes discloration and itchy sensation for the last few weeks. Admits to going on a cruise a few weeks ago and being in a lot of water.   ROS See HPI.    Objective:     BP 132/70 Comment: at home  Pulse 77   Ht 5' 3 (1.6 m)   Wt 170 lb (77.1 kg)   LMP 11/08/2011   SpO2 99%   BMI 30.11 kg/m  BP Readings from Last 3 Encounters:  09/04/23 132/70  07/04/23 110/62  06/10/23 114/62   Wt Readings from Last 3 Encounters:  09/03/23 170 lb (77.1 kg)  07/04/23 176 lb 1.3 oz (79.9 kg)  05/22/23 175 lb 9.6 oz (79.7 kg)      Physical Exam Constitutional:      Appearance: Normal appearance. She is obese.  HENT:     Head: Normocephalic.  Cardiovascular:     Rate and Rhythm: Normal rate and regular rhythm.     Pulses: Normal pulses.     Heart sounds: Normal heart sounds.  Pulmonary:     Effort: Pulmonary effort is normal.     Breath sounds: Normal breath sounds.  Musculoskeletal:     Right lower leg: Edema present.     Left lower leg: Edema  present.  Neurological:     General: No focal deficit present.     Mental Status: She is alert and oriented to person, place, and time.  Psychiatric:        Mood and Affect: Mood normal.       The 10-year ASCVD risk score (Arnett DK, et al., 2019) is: 8.4%    Assessment & Plan:  Pam Gomez was seen today for medical management of chronic issues.  Diagnoses and all orders for this visit:  Primary hypertension -     hydrochlorothiazide  (HYDRODIURIL ) 12.5 MG tablet; Take 1 tablet (12.5 mg total) by mouth daily. -     tirzepatide  (ZEPBOUND ) 10 MG/0.5ML Pen; Inject 10 mg into the skin once a week. -     CMP14+EGFR  Dyslipidemia (high LDL; low HDL) -     tirzepatide  (ZEPBOUND ) 10 MG/0.5ML Pen; Inject 10 mg into the skin once a week. -     Lipid panel  Lower extremity edema -     hydrochlorothiazide  (HYDRODIURIL ) 12.5 MG tablet; Take 1 tablet (12.5 mg total) by mouth daily.  Vitamin D  deficiency -     VITAMIN D  25 Hydroxy (Vit-D Deficiency,  Fractures)  B12 deficiency -     B12 and Folate Panel  Immunization due -     Pneumococcal conjugate vaccine 20-valent (Prevnar 20)  Abnormal ECG during exercise stress test  Epigastric discomfort  Gastroesophageal reflux disease, unspecified whether esophagitis present  Tinea pedis of both feet -     ketoconazole  (NIZORAL ) 2 % cream; Apply 1 Application topically 2 (two) times daily. To affected areas on bilateral feet for next 4-6 weeks.  Class 1 obesity due to excess calories without serious comorbidity with body mass index (BMI) of 30.0 to 30.9 in adult -     tirzepatide  (ZEPBOUND ) 10 MG/0.5ML Pen; Inject 10 mg into the skin once a week.   BP better at home Stay on hydrochlorothiazide  daily Down 35lbs and 6lbs in the last month Continue mounjaro 10mg  weekly Continue with diet and exercise Follow up in 3 months Will get fasting labs and discussed statin need  Ketoconazole  cream for fungus on feet HO given Discussed  prevent Keep feet dry    Return in about 6 months (around 03/05/2024).    Astria Jordahl, PA-C

## 2023-09-04 ENCOUNTER — Ambulatory Visit: Payer: Self-pay | Admitting: Physician Assistant

## 2023-09-04 ENCOUNTER — Encounter: Payer: Self-pay | Admitting: Physician Assistant

## 2023-09-04 LAB — CMP14+EGFR
ALT: 15 IU/L (ref 0–32)
AST: 18 IU/L (ref 0–40)
Albumin: 4.3 g/dL (ref 3.8–4.9)
Alkaline Phosphatase: 70 IU/L (ref 44–121)
BUN/Creatinine Ratio: 12 (ref 9–23)
BUN: 11 mg/dL (ref 6–24)
Bilirubin Total: 0.5 mg/dL (ref 0.0–1.2)
CO2: 23 mmol/L (ref 20–29)
Calcium: 9.7 mg/dL (ref 8.7–10.2)
Chloride: 106 mmol/L (ref 96–106)
Creatinine, Ser: 0.95 mg/dL (ref 0.57–1.00)
Globulin, Total: 3.1 g/dL (ref 1.5–4.5)
Glucose: 77 mg/dL (ref 70–99)
Potassium: 4 mmol/L (ref 3.5–5.2)
Sodium: 141 mmol/L (ref 134–144)
Total Protein: 7.4 g/dL (ref 6.0–8.5)
eGFR: 69 mL/min/1.73 (ref >59)

## 2023-09-04 LAB — VITAMIN D 25 HYDROXY (VIT D DEFICIENCY, FRACTURES): Vit D, 25-Hydroxy: 41.4 ng/mL (ref 30.0–100.0)

## 2023-09-04 LAB — LIPID PANEL
Chol/HDL Ratio: 4.6 ratio — ABNORMAL HIGH (ref 0.0–4.4)
Cholesterol, Total: 226 mg/dL — ABNORMAL HIGH (ref 100–199)
HDL: 49 mg/dL (ref >39)
LDL Chol Calc (NIH): 160 mg/dL — ABNORMAL HIGH (ref 0–99)
Triglycerides: 98 mg/dL (ref 0–149)
VLDL Cholesterol Cal: 17 mg/dL (ref 5–40)

## 2023-09-04 LAB — B12 AND FOLATE PANEL
Folate: 5.7 ng/mL (ref >3.0)
Vitamin B-12: 362 pg/mL (ref 232–1245)

## 2023-09-04 NOTE — Progress Notes (Signed)
 Kerrianne,   B12 is low normal. You could try sublingual vitamin B12 to see if could get it up some.  Vitamin D  looks good. Make sure taking daily vitamin D  to help absorb your calcium .   Your LDL went up quite a bit from last check.   Your 10 year risk is 11.8 percent;however, you had the extensive work up with no blockages. So I think ok to stay off statin but being on one could help prevent and plaque accumulation and would likely be a good idea since your LDL is elevated. Thoughts?   SABRASABRAThe 10-year ASCVD risk score (Arnett DK, et al., 2019) is: 11.8%   Values used to calculate the score:     Age: 58 years     Clincally relevant sex: Female     Is Non-Hispanic African American: Yes     Diabetic: No     Tobacco smoker: No     Systolic Blood Pressure: 148 mmHg     Is BP treated: Yes     HDL Cholesterol: 49 mg/dL     Total Cholesterol: 226 mg/dL

## 2023-09-09 NOTE — Telephone Encounter (Signed)
 Patient came in person to meet with me regarding complaint. I explained the titration to the patient and discussed the process since the provider was out of the office . Patient said she was fine with the increase and will follow up with the provider.

## 2023-10-01 ENCOUNTER — Encounter: Payer: Self-pay | Admitting: Sports Medicine

## 2023-10-25 ENCOUNTER — Other Ambulatory Visit (HOSPITAL_COMMUNITY): Payer: Self-pay

## 2023-10-29 ENCOUNTER — Other Ambulatory Visit (HOSPITAL_COMMUNITY): Payer: Self-pay

## 2023-11-11 ENCOUNTER — Ambulatory Visit: Admitting: Physician Assistant

## 2023-11-15 ENCOUNTER — Encounter: Payer: Self-pay | Admitting: Physician Assistant

## 2023-11-15 ENCOUNTER — Ambulatory Visit: Admitting: Physician Assistant

## 2023-11-15 VITALS — BP 132/76 | HR 77 | Ht 63.0 in | Wt 174.0 lb

## 2023-11-15 DIAGNOSIS — I10 Essential (primary) hypertension: Secondary | ICD-10-CM | POA: Diagnosis not present

## 2023-11-15 DIAGNOSIS — Z23 Encounter for immunization: Secondary | ICD-10-CM

## 2023-11-15 DIAGNOSIS — E66811 Obesity, class 1: Secondary | ICD-10-CM

## 2023-11-15 DIAGNOSIS — Z7689 Persons encountering health services in other specified circumstances: Secondary | ICD-10-CM

## 2023-11-15 DIAGNOSIS — E6609 Other obesity due to excess calories: Secondary | ICD-10-CM

## 2023-11-15 DIAGNOSIS — E785 Hyperlipidemia, unspecified: Secondary | ICD-10-CM | POA: Diagnosis not present

## 2023-11-15 DIAGNOSIS — R600 Localized edema: Secondary | ICD-10-CM | POA: Diagnosis not present

## 2023-11-15 DIAGNOSIS — E538 Deficiency of other specified B group vitamins: Secondary | ICD-10-CM | POA: Insufficient documentation

## 2023-11-15 DIAGNOSIS — Z6830 Body mass index (BMI) 30.0-30.9, adult: Secondary | ICD-10-CM

## 2023-11-15 DIAGNOSIS — R21 Rash and other nonspecific skin eruption: Secondary | ICD-10-CM | POA: Insufficient documentation

## 2023-11-15 DIAGNOSIS — B351 Tinea unguium: Secondary | ICD-10-CM | POA: Insufficient documentation

## 2023-11-15 MED ORDER — CLOTRIMAZOLE-BETAMETHASONE 1-0.05 % EX CREA: 1.0000 | TOPICAL_CREAM | Freq: Every day | CUTANEOUS | 1 refills | Status: AC

## 2023-11-15 MED ORDER — TERBINAFINE HCL 250 MG PO TABS: 250.0000 mg | ORAL_TABLET | Freq: Every day | ORAL | 1 refills | Status: AC

## 2023-11-15 NOTE — Patient Instructions (Signed)
 Continue zepbound  weekly Start lamisil daily Use lortisone daily as needed Get labs

## 2023-11-15 NOTE — Progress Notes (Unsigned)
   Established Patient Office Visit  Subjective   Patient ID: Pam Gomez, female    DOB: 12/02/1965  Age: 58 y.o. MRN: [id number]  No chief complaint on file.   HPI .SABRADiscussed the use of AI scribe software for clinical note transcription with the patient, who gave verbal consent to proceed.  History of Present Illness     BP elevated Down 35lbs  b12  {History (Optional):23778}  ROS    Objective:     LMP 11/08/2011  BP Readings from Last 3 Encounters:  11/15/23 (!) 157/78  09/04/23 132/70  07/04/23 110/62   Wt Readings from Last 3 Encounters:  11/15/23 174 lb (78.9 kg)  09/03/23 170 lb (77.1 kg)  07/04/23 176 lb 1.3 oz (79.9 kg)      Physical Exam   The 10-year ASCVD risk score (Arnett DK, et al., 2019) is: 8.4%    Assessment & Plan:  SABRASABRAAssessment and Plan Assessment & Plan    No follow-ups on file.    Dathan Attia, PA-C

## 2023-11-18 DIAGNOSIS — Z23 Encounter for immunization: Secondary | ICD-10-CM | POA: Diagnosis not present

## 2023-11-18 MED ORDER — ZEPBOUND 12.5 MG/0.5ML ~~LOC~~ SOAJ: 12.5000 mg | SUBCUTANEOUS | 1 refills | Status: DC

## 2023-12-17 ENCOUNTER — Other Ambulatory Visit: Payer: Self-pay | Admitting: Physician Assistant

## 2023-12-17 DIAGNOSIS — Z7689 Persons encountering health services in other specified circumstances: Secondary | ICD-10-CM

## 2023-12-17 DIAGNOSIS — E6609 Other obesity due to excess calories: Secondary | ICD-10-CM

## 2023-12-17 NOTE — Telephone Encounter (Signed)
 Copied from CRM [id number]. Topic: Clinical - Medication Refill >> Dec 17, 2023  1:43 PM Olam RAMAN wrote: Medication: tirzepatide  (ZEPBOUND ) 12.5 MG/0.5ML  Has the patient contacted their pharmacy? Yes (Agent: If no, request that the patient contact the pharmacy for the refill. If patient does not wish to contact the pharmacy document the reason why and proceed with request.) (Agent: If yes, when and what did the pharmacy advise?)  This is the patient's preferred [facility]:  [phone] [place] [place] - [address] [place] [address] Phone: [phone] Fax: [phone]  Is this the correct pharmacy for this prescription? Yes If no, delete pharmacy and type the correct one.   Has the prescription been filled recently? Yes  Is the patient out of the medication? Yes  Has the patient been seen for an appointment in the last year OR does the patient have an upcoming appointment? Yes  Can we respond through MyChart? Yes  Agent: Please be advised that Rx refills may take up to 3 business days. We ask that you follow-up with your pharmacy.

## 2023-12-17 NOTE — Telephone Encounter (Signed)
 Copied from CRM [id number]. Topic: Clinical - Medication Prior Auth >> Dec 17, 2023  1:42 PM Olam RAMAN wrote: Reason for CRM: Pt is calling to get a PA for tirzepatide  (ZEPBOUND ) 12.5 MG/0.5ML Pen  and [facility], did not know which location

## 2023-12-30 ENCOUNTER — Other Ambulatory Visit (HOSPITAL_COMMUNITY): Payer: Self-pay

## 2023-12-30 ENCOUNTER — Telehealth: Payer: Self-pay

## 2023-12-30 ENCOUNTER — Telehealth: Payer: Self-pay | Admitting: Physician Assistant

## 2023-12-30 NOTE — Telephone Encounter (Signed)
 Pharmacy Patient Advocate Encounter   Received notification from RX Request Messages that prior authorization for Zepbound  12.5mg /0.77ml is required/requested.   Insurance verification completed.   The patient is insured through [employer].   Per test claim: PA required; PA submitted to above mentioned insurance via Latent Key/confirmation #/[id number] [facility] Status is pending

## 2023-12-30 NOTE — Telephone Encounter (Signed)
 Duplicate message. Please review the other TE for additional information.

## 2023-12-30 NOTE — Telephone Encounter (Signed)
 Patient is following up on Zepbound 

## 2023-12-30 NOTE — Telephone Encounter (Signed)
 Copied from CRM #[id number]. Topic: Clinical - Medication Question >> Dec 30, 2023 10:21 AM Gustabo D wrote: Medication: tirzepatide  (ZEPBOUND ) 12.5 MG/0.5ML- Pt would like to know what the hold up is on this prescription she called about it 12/17/23 and still hasn't been able to get it. [facility]

## 2023-12-30 NOTE — Telephone Encounter (Signed)
 Patient is following up on her Zepbound .

## 2023-12-30 NOTE — Telephone Encounter (Signed)
 Pt came into the office to check the status of Prior Auth on Zepbound . Pt also wants to know she should be seen once she obtains her Rx. Please advise.

## 2023-12-31 NOTE — Telephone Encounter (Signed)
 She does not need appt. Can you help her with the PA update.

## 2024-01-01 MED ORDER — ZEPBOUND 12.5 MG/0.5ML ~~LOC~~ SOAJ: 12.5000 mg | SUBCUTANEOUS | 1 refills | Status: DC

## 2024-01-01 NOTE — Telephone Encounter (Signed)
 Pt aware that Zepbound  Prior shara has been approved effective dates 11/30/23 through 12/30/2024. Rx has been sent to [facility] to pick up one month fill. Patient also asks that Rx been sent to [facility].

## 2024-01-01 NOTE — Telephone Encounter (Signed)
 Pharmacy Patient Advocate Encounter  Received notification from [employer] that Prior Authorization for Zepbound  12.5mg /0.38ml has been APPROVED from 11/30/23 to 12/30/24   PA #/Case ID/Reference #: [id number]

## 2024-01-02 ENCOUNTER — Telehealth: Payer: Self-pay

## 2024-01-02 NOTE — Telephone Encounter (Signed)
 Spoke with patient and patient's insurance company  Regarding questions about her prior authorizations regarding zepbound .  I had called the pharmacy and was told that t he medication did go through insurance for $15.29  but was not in stock and she could pick this up tomorrow 01/03/24 after 3 pm  Issues with prior auth questions was addressed  Patient may request next refill of the prescription to go to [facility] instead of local pharmacy  But PA dept states this should go through for the 15mg  as well without need for a PA

## 2024-01-21 DIAGNOSIS — E6609 Other obesity due to excess calories: Secondary | ICD-10-CM

## 2024-01-21 DIAGNOSIS — Z7689 Persons encountering health services in other specified circumstances: Secondary | ICD-10-CM

## 2024-01-21 MED ORDER — ZEPBOUND 12.5 MG/0.5ML ~~LOC~~ SOAJ: 12.5000 mg | SUBCUTANEOUS | 0 refills | Status: DC

## 2024-01-21 NOTE — Telephone Encounter (Signed)
 Copied from CRM #[id number]. Topic: Clinical - Medication Refill >> Jan 21, 2024  1:25 PM Nathanel C wrote: Medication: tirzepatide  (ZEPBOUND ) 12.5 MG/0.5ML Pen  Has the patient contacted their pharmacy? Yes   This is the patient's preferred pharmacy:    [facility] - [place], [place] - [address] [place] [address] Phone: [phone] Fax: [phone]  Is this the correct pharmacy for this prescription? Yes If no, delete pharmacy and type the correct one.   Has the prescription been filled recently? Yes  Is the patient out of the medication? Yes  Has the patient been seen for an appointment in the last year OR does the patient have an upcoming appointment? Yes  Can we respond through MyChart? Yes  Agent: Please be advised that Rx refills may take up to 3 business days. We ask that you follow-up with your pharmacy.

## 2024-03-06 ENCOUNTER — Ambulatory Visit: Admitting: Physician Assistant

## 2024-03-06 ENCOUNTER — Other Ambulatory Visit: Payer: Self-pay | Admitting: Physician Assistant

## 2024-03-06 VITALS — BP 135/78 | HR 86 | Wt 165.0 lb

## 2024-03-06 DIAGNOSIS — Z1231 Encounter for screening mammogram for malignant neoplasm of breast: Secondary | ICD-10-CM

## 2024-03-06 DIAGNOSIS — K219 Gastro-esophageal reflux disease without esophagitis: Secondary | ICD-10-CM

## 2024-03-06 DIAGNOSIS — I10 Essential (primary) hypertension: Secondary | ICD-10-CM

## 2024-03-06 DIAGNOSIS — R5383 Other fatigue: Secondary | ICD-10-CM

## 2024-03-06 DIAGNOSIS — E559 Vitamin D deficiency, unspecified: Secondary | ICD-10-CM

## 2024-03-06 DIAGNOSIS — E538 Deficiency of other specified B group vitamins: Secondary | ICD-10-CM

## 2024-03-06 DIAGNOSIS — R4189 Other symptoms and signs involving cognitive functions and awareness: Secondary | ICD-10-CM

## 2024-03-06 DIAGNOSIS — R21 Rash and other nonspecific skin eruption: Secondary | ICD-10-CM

## 2024-03-06 DIAGNOSIS — R1013 Epigastric pain: Secondary | ICD-10-CM

## 2024-03-06 MED ORDER — OMEPRAZOLE 40 MG PO CPDR: 40.0000 mg | DELAYED_RELEASE_CAPSULE | Freq: Every day | ORAL | 1 refills | Status: AC

## 2024-03-06 MED ORDER — ZEPBOUND 15 MG/0.5ML ~~LOC~~ SOAJ: 15.0000 mg | SUBCUTANEOUS | 1 refills | Status: AC

## 2024-03-06 NOTE — Patient Instructions (Addendum)
 Use good moisturizing lotion and clobetasol/betamethasone  cream twice a day for next week on palm of hands.   Inflamed Skin (Atopic Dermatitis): What to Know Atopic dermatitis is a skin condition that causes dry, itchy, and inflamed skin. It's the most common type of eczema, which is a group of skin conditions that make your skin feel rough and puffy. This condition often gets worse in the winter and better in the summer. Atopic dermatitis usually starts in childhood and can last into adulthood. It's not contagious, so it does not spread from person to person. Your symptoms may get worse when you're having a flare-up. During a flare-up, your symptoms may get worse and bother you. What are the causes? The exact cause of this condition isn't known. Flare-ups can be triggered by: Contact with things you're sensitive or allergic to. Stress. Some foods. Very hot or cold weather. Harsh chemicals and soaps. Dry air. Chlorine. What increases the risk? You're more likely to get this condition if you have a personal or family history of: Eczema. Allergies. Asthma. Hay fever. What are the signs or symptoms?  Dry, scaly skin. Red, brown, purple, or grayish rash. Itchiness. Thick and cracked skin over time. How is this diagnosed? This condition is diagnosed based on: Symptoms. Physical exam. Medical history. How is this treated? There's no cure for this condition, but you can manage your symptoms. Do this by: Controlling your itchiness and scratching with antihistamine medicine or steroid creams. Avoiding allergens or triggers. Managing stress. Trying light therapy, also called phototherapy if other treatments don't work or if it's all over your body. Follow these instructions at home: Skin care  Keep your skin hydrated. To do this: Use unscented lotions that contain petroleum. Avoid lotions with alcohol or water. These can dry out your skin more. Take short baths or showers (less than  5 minutes). Use warm water instead of hot water. Use mild, unscented soaps. Avoid bubble bath. Put lotion on right after bathing. Do not put anything on your skin without checking with your health care provider. General instructions Take or apply your medicines only as told. Wear clothes made of cotton or cotton blends. Dress lightly to avoid itching that can be caused by heat. When doing laundry, rinse your clothes twice to remove all soap. Use soap that doesn't have dyes and perfumes. Avoid triggers that cause flare-ups. Avoid scratching. It can make the rash and itching worse and can lead to infection. Keep fingernails short to avoid scratching open the skin. Avoid people who have cold sores or fever blisters. These infections can make your condition worse. Keep all follow-up visits to make sure your treatment plan is working. Contact a health care provider if: Your itching affects your sleep. Your rash gets worse or doesn't get better after a week of treatment. You have a fever. You have a rash after being around someone with cold sores or fever blisters. You have warmth or pus in the rash area. You have soft yellow scabs in the rash area. This information is not intended to replace advice given to you by your health care provider. Make sure you discuss any questions you have with your health care provider. Document Revised: 06/19/2022 Document Reviewed: 06/19/2022 Elsevier Patient Education  2024 [employer].

## 2024-04-08 ENCOUNTER — Ambulatory Visit

## 2024-05-18 ENCOUNTER — Ambulatory Visit: Admitting: Physician Assistant

## 2024-09-03 ENCOUNTER — Ambulatory Visit: Admitting: Physician Assistant
